# Patient Record
Sex: Male | Born: 1940 | Race: Black or African American | Hispanic: No | Smoking: Former smoker
Health system: Southern US, Community
[De-identification: ages and names within clinical notes are randomized; demographics above are authoritative.]

## PROBLEM LIST (undated history)

## (undated) DIAGNOSIS — N400 Enlarged prostate without lower urinary tract symptoms: Secondary | ICD-10-CM

## (undated) DIAGNOSIS — G4700 Insomnia, unspecified: Secondary | ICD-10-CM

## (undated) DIAGNOSIS — K5730 Diverticulosis of large intestine without perforation or abscess without bleeding: Secondary | ICD-10-CM

## (undated) DIAGNOSIS — M545 Low back pain, unspecified: Secondary | ICD-10-CM

## (undated) DIAGNOSIS — M1990 Unspecified osteoarthritis, unspecified site: Secondary | ICD-10-CM

## (undated) DIAGNOSIS — R609 Edema, unspecified: Secondary | ICD-10-CM

## (undated) DIAGNOSIS — G510 Bell's palsy: Secondary | ICD-10-CM

## (undated) HISTORY — DX: Bell's palsy: G51.0

## (undated) HISTORY — DX: Low back pain: M54.5

## (undated) HISTORY — DX: Benign prostatic hyperplasia without lower urinary tract symptoms: N40.0

## (undated) HISTORY — DX: Low back pain, unspecified: M54.50

## (undated) HISTORY — PX: OTHER SURGICAL HISTORY: SHX169

## (undated) HISTORY — DX: Unspecified osteoarthritis, unspecified site: M19.90

## (undated) HISTORY — DX: Insomnia, unspecified: G47.00

## (undated) HISTORY — DX: Diverticulosis of large intestine without perforation or abscess without bleeding: K57.30

## (undated) HISTORY — DX: Edema, unspecified: R60.9

---

## 1972-03-09 HISTORY — PX: FRACTURE SURGERY: SHX138

## 1999-09-29 ENCOUNTER — Encounter: Payer: Self-pay | Admitting: Cardiology

## 1999-09-29 ENCOUNTER — Encounter: Admission: RE | Admit: 1999-09-29 | Discharge: 1999-09-29 | Payer: Self-pay | Admitting: Cardiology

## 2002-06-06 ENCOUNTER — Encounter: Payer: Self-pay | Admitting: Family Medicine

## 2004-01-23 ENCOUNTER — Ambulatory Visit: Payer: Self-pay | Admitting: Family Medicine

## 2004-06-23 ENCOUNTER — Ambulatory Visit: Payer: Self-pay | Admitting: Family Medicine

## 2004-08-28 ENCOUNTER — Ambulatory Visit: Payer: Self-pay | Admitting: Family Medicine

## 2005-02-03 ENCOUNTER — Ambulatory Visit: Payer: Self-pay | Admitting: Family Medicine

## 2005-03-19 ENCOUNTER — Ambulatory Visit: Payer: Self-pay | Admitting: Family Medicine

## 2005-06-11 ENCOUNTER — Ambulatory Visit: Payer: Self-pay | Admitting: Family Medicine

## 2005-07-02 ENCOUNTER — Ambulatory Visit: Payer: Self-pay | Admitting: Family Medicine

## 2005-07-03 ENCOUNTER — Ambulatory Visit: Payer: Self-pay | Admitting: Family Medicine

## 2005-10-14 ENCOUNTER — Ambulatory Visit: Payer: Self-pay | Admitting: Family Medicine

## 2005-10-23 ENCOUNTER — Ambulatory Visit: Payer: Self-pay | Admitting: Family Medicine

## 2005-11-23 ENCOUNTER — Ambulatory Visit: Payer: Self-pay | Admitting: Family Medicine

## 2006-01-06 ENCOUNTER — Ambulatory Visit: Payer: Self-pay | Admitting: Family Medicine

## 2006-03-24 ENCOUNTER — Ambulatory Visit: Payer: Self-pay | Admitting: Family Medicine

## 2006-03-26 ENCOUNTER — Ambulatory Visit: Payer: Self-pay | Admitting: Family Medicine

## 2006-03-26 LAB — CONVERTED CEMR LAB
ALT: 34 units/L (ref 0–40)
AST: 41 units/L — ABNORMAL HIGH (ref 0–37)
Albumin: 3.5 g/dL (ref 3.5–5.2)
Alkaline Phosphatase: 62 units/L (ref 39–117)
BUN: 9 mg/dL (ref 6–23)
Basophils Absolute: 0 10*3/uL (ref 0.0–0.1)
Basophils Relative: 0.3 % (ref 0.0–1.0)
CO2: 28 meq/L (ref 19–32)
Calcium: 9.1 mg/dL (ref 8.4–10.5)
Chloride: 109 meq/L (ref 96–112)
Cholesterol: 165 mg/dL (ref 0–200)
Creatinine, Ser: 1.3 mg/dL (ref 0.4–1.5)
Eosinophils Relative: 2.3 % (ref 0.0–5.0)
GFR calc Af Amer: 71 mL/min
GFR calc non Af Amer: 59 mL/min
Glucose, Bld: 98 mg/dL (ref 70–99)
HCT: 43.5 % (ref 39.0–52.0)
HDL: 52.7 mg/dL (ref >39.0)
Hemoglobin: 14.5 g/dL (ref 13.0–17.0)
LDL Cholesterol: 94 mg/dL (ref 0–99)
Lymphocytes Relative: 35.8 % (ref 12.0–46.0)
MCHC: 33.3 g/dL (ref 30.0–36.0)
MCV: 92.1 fL (ref 78.0–100.0)
Monocytes Absolute: 0.4 10*3/uL (ref 0.2–0.7)
Monocytes Relative: 8.9 % (ref 3.0–11.0)
Neutro Abs: 2.5 10*3/uL (ref 1.4–7.7)
Neutrophils Relative %: 52.7 % (ref 43.0–77.0)
PSA: 4.64 ng/mL — ABNORMAL HIGH (ref 0.10–4.00)
Platelets: 400 10*3/uL (ref 150–400)
Potassium: 4.5 meq/L (ref 3.5–5.1)
RBC: 4.73 M/uL (ref 4.22–5.81)
RDW: 12.1 % (ref 11.5–14.6)
Sodium: 144 meq/L (ref 135–145)
TSH: 0.84 microintl units/mL (ref 0.35–5.50)
Total Bilirubin: 0.9 mg/dL (ref 0.3–1.2)
Total CHOL/HDL Ratio: 3.1
Total Protein: 7.4 g/dL (ref 6.0–8.3)
Triglycerides: 91 mg/dL (ref 0–149)
VLDL: 18 mg/dL (ref 0–40)
WBC: 4.6 10*3/uL (ref 4.5–10.5)

## 2006-04-29 ENCOUNTER — Ambulatory Visit: Payer: Self-pay | Admitting: Family Medicine

## 2006-05-26 ENCOUNTER — Ambulatory Visit: Payer: Self-pay | Admitting: Family Medicine

## 2006-06-02 ENCOUNTER — Ambulatory Visit: Payer: Self-pay | Admitting: Cardiovascular Disease

## 2006-08-06 ENCOUNTER — Ambulatory Visit: Payer: Self-pay | Admitting: Family Medicine

## 2006-08-10 ENCOUNTER — Ambulatory Visit: Payer: Self-pay

## 2006-08-17 ENCOUNTER — Ambulatory Visit: Payer: Self-pay | Admitting: Family Medicine

## 2006-08-30 DIAGNOSIS — M5137 Other intervertebral disc degeneration, lumbosacral region: Secondary | ICD-10-CM | POA: Insufficient documentation

## 2006-08-30 DIAGNOSIS — K5730 Diverticulosis of large intestine without perforation or abscess without bleeding: Secondary | ICD-10-CM | POA: Insufficient documentation

## 2006-10-05 ENCOUNTER — Encounter: Payer: Self-pay | Admitting: Family Medicine

## 2006-10-15 ENCOUNTER — Ambulatory Visit: Payer: Self-pay | Admitting: Family Medicine

## 2006-12-06 ENCOUNTER — Telehealth: Payer: Self-pay | Admitting: Family Medicine

## 2006-12-06 DIAGNOSIS — M545 Low back pain, unspecified: Secondary | ICD-10-CM | POA: Insufficient documentation

## 2006-12-17 ENCOUNTER — Telehealth: Payer: Self-pay | Admitting: Family Medicine

## 2006-12-28 ENCOUNTER — Ambulatory Visit: Payer: Self-pay | Admitting: Family Medicine

## 2006-12-31 LAB — CONVERTED CEMR LAB
ALT: 48 units/L (ref 0–53)
AST: 46 units/L — ABNORMAL HIGH (ref 0–37)
Basophils Relative: 0.8 % (ref 0.0–1.0)
Bilirubin, Direct: 0.2 mg/dL (ref 0.0–0.3)
CO2: 29 meq/L (ref 19–32)
Chloride: 109 meq/L (ref 96–112)
Creatinine, Ser: 1.2 mg/dL (ref 0.4–1.5)
Eosinophils Relative: 4.9 % (ref 0.0–5.0)
Glucose, Bld: 100 mg/dL — ABNORMAL HIGH (ref 70–99)
HCT: 41.7 % (ref 39.0–52.0)
LDL Cholesterol: 112 mg/dL — ABNORMAL HIGH (ref 0–99)
MCV: 90.8 fL (ref 78.0–100.0)
Neutrophils Relative %: 51.5 % (ref 43.0–77.0)
PSA: 3.13 ng/mL (ref 0.10–4.00)
RBC: 4.6 M/uL (ref 4.22–5.81)
RDW: 12.8 % (ref 11.5–14.6)
Sodium: 142 meq/L (ref 135–145)
Total Bilirubin: 0.7 mg/dL (ref 0.3–1.2)
Total CHOL/HDL Ratio: 3.3
VLDL: 10 mg/dL (ref 0–40)
WBC: 4.2 10*3/uL — ABNORMAL LOW (ref 4.5–10.5)

## 2007-01-04 ENCOUNTER — Ambulatory Visit: Payer: Self-pay | Admitting: Family Medicine

## 2007-01-04 DIAGNOSIS — N400 Enlarged prostate without lower urinary tract symptoms: Secondary | ICD-10-CM | POA: Insufficient documentation

## 2007-01-04 DIAGNOSIS — N138 Other obstructive and reflux uropathy: Secondary | ICD-10-CM | POA: Insufficient documentation

## 2007-01-04 DIAGNOSIS — M1990 Unspecified osteoarthritis, unspecified site: Secondary | ICD-10-CM | POA: Insufficient documentation

## 2007-01-28 ENCOUNTER — Telehealth: Payer: Self-pay | Admitting: Family Medicine

## 2007-01-28 ENCOUNTER — Encounter: Payer: Self-pay | Admitting: Family Medicine

## 2007-02-10 ENCOUNTER — Encounter: Admission: RE | Admit: 2007-02-10 | Discharge: 2007-02-10 | Payer: Self-pay | Admitting: Orthopaedic Surgery

## 2007-03-04 ENCOUNTER — Telehealth: Payer: Self-pay | Admitting: Family Medicine

## 2007-03-09 ENCOUNTER — Encounter: Payer: Self-pay | Admitting: Family Medicine

## 2007-03-18 ENCOUNTER — Encounter: Admission: RE | Admit: 2007-03-18 | Discharge: 2007-03-18 | Payer: Self-pay | Admitting: Orthopaedic Surgery

## 2007-04-08 ENCOUNTER — Ambulatory Visit: Payer: Self-pay | Admitting: Family Medicine

## 2007-04-08 DIAGNOSIS — J209 Acute bronchitis, unspecified: Secondary | ICD-10-CM

## 2007-05-05 ENCOUNTER — Telehealth: Payer: Self-pay | Admitting: Family Medicine

## 2007-05-06 ENCOUNTER — Telehealth: Payer: Self-pay | Admitting: Family Medicine

## 2007-06-08 ENCOUNTER — Telehealth: Payer: Self-pay | Admitting: Family Medicine

## 2007-06-22 ENCOUNTER — Telehealth: Payer: Self-pay | Admitting: Family Medicine

## 2007-06-24 ENCOUNTER — Encounter: Payer: Self-pay | Admitting: Family Medicine

## 2007-07-25 ENCOUNTER — Encounter: Payer: Self-pay | Admitting: Family Medicine

## 2007-11-30 ENCOUNTER — Telehealth: Payer: Self-pay | Admitting: Family Medicine

## 2007-11-30 ENCOUNTER — Ambulatory Visit: Payer: Self-pay | Admitting: Family Medicine

## 2008-01-04 ENCOUNTER — Ambulatory Visit: Payer: Self-pay | Admitting: Family Medicine

## 2008-01-04 LAB — CONVERTED CEMR LAB
Nitrite: NEGATIVE
Protein, U semiquant: NEGATIVE
Urobilinogen, UA: 0.2

## 2008-02-01 ENCOUNTER — Ambulatory Visit: Payer: Self-pay | Admitting: Family Medicine

## 2008-02-01 LAB — CONVERTED CEMR LAB
Bilirubin Urine: NEGATIVE
Blood in Urine, dipstick: NEGATIVE
Protein, U semiquant: NEGATIVE
Urobilinogen, UA: 2
pH: 5.5

## 2008-02-06 LAB — CONVERTED CEMR LAB
Albumin: 4.6 g/dL (ref 3.5–5.2)
BUN: 12 mg/dL (ref 6–23)
CO2: 24 meq/L (ref 19–32)
Calcium: 9.7 mg/dL (ref 8.4–10.5)
Creatinine, Ser: 1.1 mg/dL (ref 0.40–1.50)
Eosinophils Absolute: 0 10*3/uL (ref 0.0–0.7)
Eosinophils Relative: 1 % (ref 0–5)
Glucose, Bld: 96 mg/dL (ref 70–99)
HCT: 47.6 % (ref 39.0–52.0)
Hemoglobin: 15.7 g/dL (ref 13.0–17.0)
Lymphocytes Relative: 32 % (ref 12–46)
Lymphs Abs: 1.3 10*3/uL (ref 0.7–4.0)
MCV: 89.1 fL (ref 78.0–100.0)
Monocytes Absolute: 0.3 10*3/uL (ref 0.1–1.0)
Monocytes Relative: 8 % (ref 3–12)
PSA: 3.43 ng/mL (ref 0.10–4.00)
Platelets: 226 10*3/uL (ref 150–400)
Sodium: 142 meq/L (ref 135–145)
TSH: 0.904 microintl units/mL (ref 0.350–4.50)
Total Bilirubin: 1 mg/dL (ref 0.3–1.2)
Total Protein: 8.2 g/dL (ref 6.0–8.3)
WBC: 4.1 10*3/uL (ref 4.0–10.5)

## 2008-03-15 ENCOUNTER — Telehealth: Payer: Self-pay | Admitting: Family Medicine

## 2008-04-20 ENCOUNTER — Telehealth: Payer: Self-pay | Admitting: Family Medicine

## 2008-05-15 ENCOUNTER — Telehealth: Payer: Self-pay | Admitting: Family Medicine

## 2008-06-12 ENCOUNTER — Telehealth: Payer: Self-pay | Admitting: Family Medicine

## 2008-07-25 ENCOUNTER — Telehealth: Payer: Self-pay | Admitting: Family Medicine

## 2008-08-31 ENCOUNTER — Telehealth: Payer: Self-pay | Admitting: Family Medicine

## 2008-10-08 ENCOUNTER — Telehealth: Payer: Self-pay | Admitting: *Deleted

## 2008-11-09 ENCOUNTER — Telehealth: Payer: Self-pay | Admitting: Family Medicine

## 2008-11-22 ENCOUNTER — Telehealth: Payer: Self-pay | Admitting: Family Medicine

## 2008-12-26 ENCOUNTER — Telehealth: Payer: Self-pay | Admitting: Family Medicine

## 2009-02-04 ENCOUNTER — Telehealth: Payer: Self-pay | Admitting: Family Medicine

## 2009-02-06 ENCOUNTER — Ambulatory Visit: Payer: Self-pay | Admitting: Family Medicine

## 2009-03-18 ENCOUNTER — Telehealth: Payer: Self-pay | Admitting: Family Medicine

## 2009-03-29 ENCOUNTER — Telehealth: Payer: Self-pay | Admitting: Family Medicine

## 2009-04-02 ENCOUNTER — Telehealth: Payer: Self-pay | Admitting: Family Medicine

## 2009-04-24 ENCOUNTER — Telehealth: Payer: Self-pay | Admitting: Family Medicine

## 2009-05-02 ENCOUNTER — Ambulatory Visit: Payer: Self-pay | Admitting: Family Medicine

## 2009-05-03 LAB — CONVERTED CEMR LAB
ALT: 31 units/L (ref 0–53)
BUN: 11 mg/dL (ref 6–23)
Basophils Relative: 0.3 % (ref 0.0–3.0)
CO2: 31 meq/L (ref 19–32)
Chloride: 108 meq/L (ref 96–112)
Cholesterol: 198 mg/dL (ref 0–200)
Creatinine, Ser: 1 mg/dL (ref 0.4–1.5)
Eosinophils Absolute: 0.1 10*3/uL (ref 0.0–0.7)
Eosinophils Relative: 2 % (ref 0.0–5.0)
HCT: 47.3 % (ref 39.0–52.0)
LDL Cholesterol: 121 mg/dL — ABNORMAL HIGH (ref 0–99)
Lymphs Abs: 1.6 10*3/uL (ref 0.7–4.0)
MCHC: 32.6 g/dL (ref 30.0–36.0)
MCV: 94.1 fL (ref 78.0–100.0)
Monocytes Absolute: 0.4 10*3/uL (ref 0.1–1.0)
Neutrophils Relative %: 48.7 % (ref 43.0–77.0)
PSA: 2.22 ng/mL (ref 0.10–4.00)
Platelets: 199 10*3/uL (ref 150.0–400.0)
Potassium: 4.6 meq/L (ref 3.5–5.1)
TSH: 1.25 microintl units/mL (ref 0.35–5.50)
Total Bilirubin: 0.8 mg/dL (ref 0.3–1.2)
Total Protein: 7.8 g/dL (ref 6.0–8.3)
Triglycerides: 53 mg/dL (ref 0.0–149.0)
WBC: 4 10*3/uL — ABNORMAL LOW (ref 4.5–10.5)

## 2009-05-14 ENCOUNTER — Telehealth: Payer: Self-pay | Admitting: Family Medicine

## 2009-05-29 ENCOUNTER — Telehealth: Payer: Self-pay | Admitting: Family Medicine

## 2009-06-18 ENCOUNTER — Encounter (INDEPENDENT_AMBULATORY_CARE_PROVIDER_SITE_OTHER): Payer: Self-pay | Admitting: *Deleted

## 2009-07-09 ENCOUNTER — Telehealth: Payer: Self-pay | Admitting: Family Medicine

## 2009-08-20 ENCOUNTER — Telehealth: Payer: Self-pay | Admitting: Family Medicine

## 2009-08-28 ENCOUNTER — Telehealth: Payer: Self-pay | Admitting: Family Medicine

## 2009-09-26 ENCOUNTER — Telehealth: Payer: Self-pay | Admitting: Family Medicine

## 2009-11-12 ENCOUNTER — Telehealth: Payer: Self-pay | Admitting: Family Medicine

## 2009-12-17 ENCOUNTER — Telehealth: Payer: Self-pay | Admitting: Family Medicine

## 2010-01-21 ENCOUNTER — Telehealth: Payer: Self-pay | Admitting: Family Medicine

## 2010-02-25 ENCOUNTER — Telehealth: Payer: Self-pay | Admitting: Family Medicine

## 2010-02-27 ENCOUNTER — Telehealth: Payer: Self-pay | Admitting: Family Medicine

## 2010-04-08 NOTE — Progress Notes (Signed)
Summary: Pt req script for Percocet  Phone Note Call from Patient Call back at Home Phone [phone]   Caller: Patient Summary of Call: Pt req script for Percocet. Please call when ready for pick up.  Initial call taken by: Lucy Antigua,  April 24, 2009 1:17 PM  Follow-up for Phone Call        done Follow-up by: Nelwyn Salisbury MD,  April 24, 2009 2:23 PM  Additional Follow-up for Phone Call Additional follow up Details #1::        rx up front ready for p/u, pt aware Additional Follow-up by: Alfred Levins, CMA,  April 24, 2009 3:20 PM    Prescriptions: PERCOCET 7.5-500 MG  TABS (OXYCODONE-ACETAMINOPHEN) 1 every 6 hours as needed pain  #120 x 0   Entered and Authorized by:   Nelwyn Salisbury MD   Signed by:   Nelwyn Salisbury MD on 04/24/2009   Method used:   Print then Give to Patient   RxID:   [id number]

## 2010-04-08 NOTE — Progress Notes (Signed)
Summary: refill Temazepam  Phone Note Refill Request Message from:  Fax from Pharmacy on August 28, 2009 1:52 PM  Refills Requested: Medication #1:  TEMAZEPAM 30 MG CAPS 1 by mouth at bedtime   Dosage confirmed as above?Dosage Confirmed   Supply Requested: 1 month   Last Refilled: 07/29/2009  Method Requested: Fax to Local Pharmacy Initial call taken by: Raechel Ache, RN,  August 28, 2009 1:53 PM Caller: [facility] [address] [phone]*  Follow-up for Phone Call        call in #30 with 5 rf Follow-up by: Nelwyn Salisbury MD,  August 28, 2009 4:17 PM    Prescriptions: TEMAZEPAM 30 MG CAPS (TEMAZEPAM) 1 by mouth at bedtime  #30 x 5   Entered by:   Raechel Ache, RN   Authorized by:   Nelwyn Salisbury MD   Signed by:   Raechel Ache, RN on 08/28/2009   Method used:   Historical   RxID:   [id number]

## 2010-04-08 NOTE — Progress Notes (Signed)
Summary: refill temazepam  Phone Note From Pharmacy   Caller: [facility] [address] [phone]* Call For: fry  Summary of Call: refill temazepam 30mg  1 by mouth at bedtime Initial call taken by: Alfred Levins, CMA,  March 29, 2009 1:59 PM  Follow-up for Phone Call        No we gave him a 6 month supply in Sept.  Follow-up by: Nelwyn Salisbury MD,  March 29, 2009 3:28 PM  Additional Follow-up for Phone Call Additional follow up Details #1::        Phone call completed, Pharmacist called Additional Follow-up by: Alfred Levins, CMA,  March 29, 2009 3:36 PM

## 2010-04-08 NOTE — Letter (Signed)
Summary: Colonoscopy Date Change Letter  [facility]  [address] [place], [place] [address]   Phone: [phone]  Fax: [phone]      June 18, 2009 MRN: [id number]   [facility] [address] [place], [place]  [address]   Dear Mr. Nurse,   Previously you were recommended to have a repeat colonoscopy around this time. Your chart was recently reviewed by Dr. Judie Petit T. Russella Dar of [facility]. Follow up colonoscopy is now recommended in April 2014. This revised recommendation is based on current, nationally recognized guidelines for colorectal cancer screening and polyp surveillance. These guidelines are endorsed by the American Cancer Society, [employer] on Colorectal Cancer as well as numerous other major medical organizations.  Please understand that our recommendation assumes that you do not have any new symptoms such as bleeding, a change in bowel habits, anemia, or significant abdominal discomfort. If you do have any concerning GI symptoms or want to discuss the guideline recommendations, please call to arrange an office visit at your earliest convenience. Otherwise we will keep you in our reminder system and contact you 1-2 months prior to the date listed above to schedule your next colonoscopy.  Thank you,  Judie Petit T. Russella Dar, M.D.  [facility] [phone]

## 2010-04-08 NOTE — Progress Notes (Signed)
Summary: new rx  Phone Note Call from Patient Call back at Home Phone [phone]   Caller: Patient Call For: Brian Salisbury MD Summary of Call: pt needs new rx for percocet Initial call taken by: Heron Sabins,  Jul 09, 2009 8:46 AM  Follow-up for Phone Call        done Follow-up by: Brian Salisbury MD,  Jul 09, 2009 1:13 PM  Additional Follow-up for Phone Call Additional follow up Details #1::        Phone Call Completed Additional Follow-up by: Raechel Ache, RN,  Jul 09, 2009 1:22 PM    Prescriptions: PERCOCET 7.5-500 MG  TABS (OXYCODONE-ACETAMINOPHEN) 1 every 6 hours as needed pain  #120 x 0   Entered and Authorized by:   Brian Salisbury MD   Signed by:   Brian Salisbury MD on 07/09/2009   Method used:   Print then Give to Patient   RxID:   [id number]

## 2010-04-08 NOTE — Progress Notes (Signed)
Summary: refill  Phone Note Refill Request Call back at Home Phone [phone] Message from:  Patient---live call  Refills Requested: Medication #1:  PERCOCET 7.5-500 MG  TABS 1 every 6 hours as needed pain call pt when ready for p/u.  Initial call taken by: Warnell Forester,  January 21, 2010 9:10 AM  Follow-up for Phone Call        Pt called back to check on status of refill.  Follow-up by: Lucy Antigua,  January 22, 2010 8:31 AM  Additional Follow-up for Phone Call Additional follow up Details #1::        done Additional Follow-up by: Nelwyn Salisbury MD,  January 22, 2010 8:35 AM    Additional Follow-up for Phone Call Additional follow up Details #2::    pt notified Follow-up by: Pura Spice, RN,  January 22, 2010 8:43 AM  Prescriptions: PERCOCET 7.5-500 MG  TABS (OXYCODONE-ACETAMINOPHEN) 1 every 6 hours as needed pain  #120 x 0   Entered and Authorized by:   Nelwyn Salisbury MD   Signed by:   Nelwyn Salisbury MD on 01/22/2010   Method used:   Print then Give to Patient   RxID:   [id number]

## 2010-04-08 NOTE — Progress Notes (Signed)
Summary: Pt req script for Percocet  Phone Note Call from Patient Call back at Home Phone [phone]   Caller: Patient Summary of Call: Pt req script for Percocet. Pls call when ready for pick up.  Initial call taken by: Lucy Antigua,  August 20, 2009 2:06 PM  Follow-up for Phone Call        done Follow-up by: Nelwyn Salisbury MD,  August 21, 2009 5:27 PM  Additional Follow-up for Phone Call Additional follow up Details #1::        called Additional Follow-up by: Raechel Ache, RN,  August 22, 2009 8:18 AM    Prescriptions: PERCOCET 7.5-500 MG  TABS (OXYCODONE-ACETAMINOPHEN) 1 every 6 hours as needed pain  #120 x 0   Entered and Authorized by:   Nelwyn Salisbury MD   Signed by:   Nelwyn Salisbury MD on 08/21/2009   Method used:   Print then Give to Patient   RxID:   [id number]

## 2010-04-08 NOTE — Progress Notes (Signed)
Summary: lab results.  Phone Note Call from Patient   Caller: Patient Call For: Nelwyn Salisbury MD Summary of Call: [phone] Pt is asking for lab results. Initial call taken by: Lynann Beaver CMA,  May 14, 2009 1:16 PM  Follow-up for Phone Call        see report Follow-up by: Nelwyn Salisbury MD,  May 14, 2009 5:03 PM  Additional Follow-up for Phone Call Additional follow up Details #1::        Phone Call Completed Additional Follow-up by: Alfred Levins, CMA,  May 14, 2009 5:25 PM

## 2010-04-08 NOTE — Progress Notes (Signed)
Summary: refill  Phone Note Call from Brian Bond Call back at Home Phone [phone]   Caller: Brian Bond-refill Reason for Call: Refill Medication Summary of Call: refill percocet. call when ready. Initial call taken by: Warnell Forester,  March 18, 2009 9:05 AM  Follow-up for Phone Call        done Follow-up by: Nelwyn Salisbury MD,  March 18, 2009 3:50 PM  Additional Follow-up for Phone Call Additional follow up Details #1::        rx up front ready for p/u, pt aware Additional Follow-up by: Alfred Levins, CMA,  March 19, 2009 10:07 AM    Prescriptions: PERCOCET 7.5-500 MG  TABS (OXYCODONE-ACETAMINOPHEN) 1 every 6 hours as needed pain  #120 x 0   Entered and Authorized by:   Nelwyn Salisbury MD   Signed by:   Nelwyn Salisbury MD on 03/18/2009   Method used:   Print then Give to Brian Bond   RxID:   [id number]

## 2010-04-08 NOTE — Progress Notes (Signed)
Summary: Rx Refill  Phone Note Refill Request Message from:  Patient on December 17, 2009 9:36 AM  Refills Requested: Medication #1:  PERCOCET 7.5-500 MG  TABS 1 every 6 hours as needed pain  Method Requested: Pick up at Office Initial call taken by: Trixie Dredge,  December 17, 2009 9:36 AM  Follow-up for Phone Call        done  Follow-up by: Nelwyn Salisbury MD,  December 17, 2009 10:27 AM  Additional Follow-up for Phone Call Additional follow up Details #1::        PT AWARE. Additional Follow-up by: Pura Spice, RN,  December 17, 2009 10:31 AM    Prescriptions: PERCOCET 7.5-500 MG  TABS (OXYCODONE-ACETAMINOPHEN) 1 every 6 hours as needed pain  #120 x 0   Entered and Authorized by:   Nelwyn Salisbury MD   Signed by:   Nelwyn Salisbury MD on 12/17/2009   Method used:   Print then Give to Patient   RxID:   [id number]

## 2010-04-08 NOTE — Progress Notes (Signed)
Summary: Pt req script PERCOCET 7.5-500 MG  TABS  Phone Note Refill Request Call back at Home Phone [phone] Message from:  Patient on September 26, 2009 2:31 PM  Refills Requested: Medication #1:  PERCOCET 7.5-500 MG  TABS 1 every 6 hours as needed pain  Method Requested: Pick up at Office Initial call taken by: Lucy Antigua,  September 26, 2009 2:31 PM  Follow-up for Phone Call        done Follow-up by: Nelwyn Salisbury MD,  September 27, 2009 10:40 AM    Prescriptions: PERCOCET 7.5-500 MG  TABS (OXYCODONE-ACETAMINOPHEN) 1 every 6 hours as needed pain  #120 x 0   Entered and Authorized by:   Nelwyn Salisbury MD   Signed by:   Nelwyn Salisbury MD on 09/27/2009   Method used:   Print then Give to Patient   RxID:   [id number]

## 2010-04-08 NOTE — Progress Notes (Signed)
Summary: Pt req script for Percocet  Phone Note Refill Request Call back at Home Phone [phone] Message from:  Patient on November 12, 2009 8:41 AM  Refills Requested: Medication #1:  PERCOCET 7.5-500 MG  TABS 1 every 6 hours as needed pain   Dosage confirmed as above?Dosage Confirmed  Method Requested: Pick up at Office Initial call taken by: Lucy Antigua,  November 12, 2009 8:40 AM  Follow-up for Phone Call        done Follow-up by: Nelwyn Salisbury MD,  November 12, 2009 10:39 AM  Additional Follow-up for Phone Call Additional follow up Details #1::        pt notified, Additional Follow-up by: Pura Spice, RN,  November 12, 2009 11:18 AM    Prescriptions: PERCOCET 7.5-500 MG  TABS (OXYCODONE-ACETAMINOPHEN) 1 every 6 hours as needed pain  #120 x 0   Entered and Authorized by:   Nelwyn Salisbury MD   Signed by:   Nelwyn Salisbury MD on 11/12/2009   Method used:   Print then Give to Patient   RxID:   [id number]

## 2010-04-08 NOTE — Progress Notes (Signed)
Summary: refill temazepam  Phone Note From Pharmacy   Caller: [facility] [address] [phone]* Call For: fry  Summary of Call: pt does not have anymore refills of temazepam 30mg .  They only gave him 4 refills and the refill dates are  11/09/08 12/09/08 01/08/09 02/06/09 03/07/09 please advise  Initial call taken by: Alfred Levins, CMA,  April 02, 2009 11:26 AM  Follow-up for Phone Call        call in #30 with 5 rf Follow-up by: Nelwyn Salisbury MD,  April 03, 2009 8:41 AM  Additional Follow-up for Phone Call Additional follow up Details #1::        Phone call completed, Pharmacist called Additional Follow-up by: Alfred Levins, CMA,  April 03, 2009 10:23 AM    Prescriptions: TEMAZEPAM 30 MG CAPS (TEMAZEPAM) 1 by mouth at bedtime  #30 x 5   Entered by:   Alfred Levins, CMA   Authorized by:   Nelwyn Salisbury MD   Signed by:   Alfred Levins, CMA on 04/03/2009   Method used:   Telephoned to ...       [facility] [id number] (retail)       [address] [place], [place]  [address]       Ph: [phone]       Fax: [phone]   RxID:   [id number]

## 2010-04-08 NOTE — Progress Notes (Signed)
Summary: new rx  Phone Note Call from Patient Call back at Home Phone [phone]   Caller: Patient Call For: Nelwyn Salisbury MD Summary of Call: pt needs new rx percocet call when ready Initial call taken by: Heron Sabins,  May 29, 2009 4:56 PM  Follow-up for Phone Call        done Follow-up by: Nelwyn Salisbury MD,  May 30, 2009 8:38 AM    Prescriptions: PERCOCET 7.5-500 MG  TABS (OXYCODONE-ACETAMINOPHEN) 1 every 6 hours as needed pain  #120 x 0   Entered and Authorized by:   Nelwyn Salisbury MD   Signed by:   Nelwyn Salisbury MD on 05/30/2009   Method used:   Print then Give to Patient   RxID:   [id number]

## 2010-04-08 NOTE — Assessment & Plan Note (Signed)
Summary: ROA/FUP/RCD   Vital Signs:  Patient profile:   70 year old male Weight:      213 pounds Temp:     98.4 degrees F oral BP sitting:   102 / 80  (left arm) Cuff size:   large  Vitals Entered By: Alfred Levins, CMA (May 02, 2009 9:53 AM)  History of Present Illness: 70 yr old male for a cpx. He is doing well except for continued urination difficulties, including urgency and frequency. he stopped Avodart for awhile to see if it was helping, and sure enough his symptoms worsened after that. So he has started back on this and Flomax together.   Current Medications (verified): 1)  Temazepam 30 Mg Caps (Temazepam) .Marland Kitchen.. 1 By Mouth At Bedtime 2)  Percocet 7.5-500 Mg  Tabs (Oxycodone-Acetaminophen) .Marland Kitchen.. 1 Every 6 Hours As Needed Pain 3)  Flomax 0.4 Mg Caps (Tamsulosin Hcl) .... Once Daily 4)  Avodart 0.5 Mg Caps (Dutasteride) .Marland Kitchen.. 1 By Mouth Daily  Allergies (verified): No Known Drug Allergies  Past History:  Past Medical History: Reviewed history from 02/06/2009 and no changes required. Diverticulosis, colon Low back pain, saw  Dr. Noel Gerold. Had ESI series. LE edema Osteoarthritis insomnia Benign prostatic hypertrophy  Past Surgical History: Reviewed history from 01/04/2007 and no changes required. colonoscopy 3-04 per Dr. Russella Dar, recommended repeat in 2011  Family History: Reviewed history from 01/04/2007 and no changes required. Family History Hypertension  Social History: Reviewed history from 01/04/2007 and no changes required. Married Never Smoked Alcohol use-yes  Review of Systems  The patient denies anorexia, fever, weight loss, weight gain, vision loss, decreased hearing, hoarseness, chest pain, syncope, dyspnea on exertion, peripheral edema, prolonged cough, headaches, hemoptysis, abdominal pain, melena, hematochezia, severe indigestion/heartburn, hematuria, incontinence, genital sores, muscle weakness, suspicious skin lesions, transient blindness,  difficulty walking, depression, unusual weight change, abnormal bleeding, enlarged lymph nodes, angioedema, breast masses, and testicular masses.    Physical Exam  General:  Well-developed,well-nourished,in no acute distress; alert,appropriate and cooperative throughout examination Head:  Normocephalic and atraumatic without obvious abnormalities. No apparent alopecia or balding. Eyes:  No corneal or conjunctival inflammation noted. EOMI. Perrla. Funduscopic exam benign, without hemorrhages, exudates or papilledema. Vision grossly normal. Ears:  External ear exam shows no significant lesions or deformities.  Otoscopic examination reveals clear canals, tympanic membranes are intact bilaterally without bulging, retraction, inflammation or discharge. Hearing is grossly normal bilaterally. Nose:  External nasal examination shows no deformity or inflammation. Nasal mucosa are pink and moist without lesions or exudates. Mouth:  Oral mucosa and oropharynx without lesions or exudates.  Teeth in good repair. Neck:  No deformities, masses, or tenderness noted. Chest Wall:  No deformities, masses, tenderness or gynecomastia noted. Lungs:  Normal respiratory effort, chest expands symmetrically. Lungs are clear to auscultation, no crackles or wheezes. Heart:  Normal rate and regular rhythm. S1 and S2 normal without gallop, murmur, click, rub or other extra sounds. Abdomen:  Bowel sounds positive,abdomen soft and non-tender without masses, organomegaly or hernias noted. Rectal:  No external abnormalities noted. Normal sphincter tone. No rectal masses or tenderness. Heme neg Genitalia:  Testes bilaterally descended without nodularity, tenderness or masses. No scrotal masses or lesions. No penis lesions or urethral discharge. Prostate:  no nodules, no asymmetry, no induration, and 2+ enlarged.   Msk:  No deformity or scoliosis noted of thoracic or lumbar spine.   Pulses:  R and L carotid,radial,femoral,dorsalis  pedis and posterior tibial pulses are full and equal bilaterally Extremities:  No  clubbing, cyanosis, edema, or deformity noted with normal full range of motion of all joints.   Neurologic:  No cranial nerve deficits noted. Station and gait are normal. Plantar reflexes are down-going bilaterally. DTRs are symmetrical throughout. Sensory, motor and coordinative functions appear intact. Skin:  Intact without suspicious lesions or rashes Cervical Nodes:  No lymphadenopathy noted Axillary Nodes:  No palpable lymphadenopathy Inguinal Nodes:  No significant adenopathy Psych:  Cognition and judgment appear intact. Alert and cooperative with normal attention span and concentration. No apparent delusions, illusions, hallucinations   Impression & Recommendations:  Problem # 1:  EXAMINATION, ROUTINE MEDICAL (ICD-V70.0)  Orders: Hemoccult Guaiac-1 spec.(in office) (82270) UA Dipstick w/o Micro (automated)  (81003) Venipuncture (44010) TLB-Lipid Panel (80061-LIPID) TLB-BMP (Basic Metabolic Panel-BMET) (80048-METABOL) TLB-CBC Platelet - w/Differential (85025-CBCD) TLB-Hepatic/Liver Function Pnl (80076-HEPATIC) TLB-TSH (Thyroid Stimulating Hormone) (84443-TSH) TLB-PSA (Prostate Specific Antigen) (84153-PSA)  Complete Medication List: 1)  Temazepam 30 Mg Caps (Temazepam) .Marland Kitchen.. 1 by mouth at bedtime 2)  Percocet 7.5-500 Mg Tabs (Oxycodone-acetaminophen) .Marland Kitchen.. 1 every 6 hours as needed pain 3)  Flomax 0.4 Mg Caps (Tamsulosin hcl) .... Once daily 4)  Avodart 0.5 Mg Caps (Dutasteride) .Marland Kitchen.. 1 by mouth daily  Patient Instructions: 1)  Get labs today. Prescriptions: AVODART 0.5 MG CAPS (DUTASTERIDE) 1 by mouth daily  #30 x 11   Entered and Authorized by:   Nelwyn Salisbury MD   Signed by:   Nelwyn Salisbury MD on 05/02/2009   Method used:   Electronically to        [facility] [id number] (retail)       [address]       [place], [place]  [address]       Ph: [phone]        Fax: [phone]   RxID:   [id number] FLOMAX 0.4 MG CAPS (TAMSULOSIN HCL) once daily  #30 x 11   Entered and Authorized by:   Nelwyn Salisbury MD   Signed by:   Nelwyn Salisbury MD on 05/02/2009   Method used:   Electronically to        [facility] [id number] (retail)       [address] [place], [place]  [address]       Ph: [phone]       Fax: [phone]   RxID:   [id number]   Appended Document: ROA/FUP/RCD  Laboratory Results   Urine Tests    Routine Urinalysis   Color: yellow Appearance: Clear Glucose: negative   (Normal Range: Negative) Bilirubin: negative   (Normal Range: Negative) Ketone: negative   (Normal Range: Negative) Spec. Gravity: 1.025   (Normal Range: 1.003-1.035) Blood: negative   (Normal Range: Negative) pH: 5.5   (Normal Range: 5.0-8.0) Protein: negative   (Normal Range: Negative) Urobilinogen: 0.2   (Normal Range: 0-1) Nitrite: negative   (Normal Range: Negative) Leukocyte Esterace: negative   (Normal Range: Negative)    Comments: Rita Ohara  May 02, 2009 11:21 AM

## 2010-04-08 NOTE — Progress Notes (Signed)
Summary: Percocett refill request  Phone Note Call from Patient Call back at Home Phone [phone]   Caller: Patient Call For: Clent Ridges Summary of Call: Pt stopped by office and made a written request for a refill of Percocett 7.5-500mg , one tab every 6 hours as needed pain.  Last filled on 04/08/07, #60 with 0 RF. [facility] [address] Initial call taken by: Sid Falcon LPN,  May 06, 2007 2:16 PM  Follow-up for Phone Call        done Follow-up by: Nelwyn Salisbury MD,  May 06, 2007 4:53 PM  Additional Follow-up for Phone Call Additional follow up Details #1::        Phone Call Completed Additional Follow-up by: Alfred Levins, CMA,  May 06, 2007 4:54 PM      Prescriptions: PERCOCET 7.5-500 MG  TABS (OXYCODONE-ACETAMINOPHEN) 1 every 6 hours as needed pain  #60 x 0   Entered and Authorized by:   Nelwyn Salisbury MD   Signed by:   Nelwyn Salisbury MD on 05/06/2007   Method used:   Print then Give to Patient   RxID:   [id number]

## 2010-04-10 NOTE — Progress Notes (Signed)
Summary: refill pain med  Phone Note Refill Request Call back at Home Phone [phone] Message from:  Patient---live call  Refills Requested: Medication #1:  PERCOCET 7.5-500 MG  TABS 1 every 6 hours as needed pain call pt when done.  Initial call taken by: Warnell Forester,  February 27, 2010 8:45 AM  Follow-up for Phone Call        pt cb/pt is aware rx not ready for pick up Follow-up by: Heron Sabins,  March 04, 2010 9:16 AM  Additional Follow-up for Phone Call Additional follow up Details #1::        done Additional Follow-up by: Nelwyn Salisbury MD,  March 04, 2010 1:17 PM    Additional Follow-up for Phone Call Additional follow up Details #2::    pt aware rx ready for pick up    KIK Follow-up by: Duard Brady LPN,  March 04, 2010 2:01 PM  Prescriptions: PERCOCET 7.5-500 MG  TABS (OXYCODONE-ACETAMINOPHEN) 1 every 6 hours as needed pain  #120 x 0   Entered and Authorized by:   Nelwyn Salisbury MD   Signed by:   Nelwyn Salisbury MD on 03/04/2010   Method used:   Print then Give to Patient   RxID:   [id number]

## 2010-04-10 NOTE — Progress Notes (Signed)
Summary: rx temazepam   Phone Note From Pharmacy   Caller: [facility] [address]. [phone]* Summary of Call: refill temazepam  30 mg  Initial call taken by: Pura Spice, RN,  February 25, 2010 5:17 PM  Follow-up for Phone Call        call in #30 with 5 rf  Follow-up by: Nelwyn Salisbury MD,  February 26, 2010 10:08 AM  Additional Follow-up for Phone Call Additional follow up Details #1::        done  Additional Follow-up by: Pura Spice, RN,  February 26, 2010 12:03 PM    Prescriptions: TEMAZEPAM 30 MG CAPS (TEMAZEPAM) 1 by mouth at bedtime  #30 x 5   Entered by:   Pura Spice, RN   Authorized by:   Nelwyn Salisbury MD   Signed by:   Pura Spice, RN on 02/26/2010   Method used:   Telephoned to ...       [facility] [id number] (retail)       [address] [place], [place]  [address]       Ph: [phone]       Fax: [phone]   RxID:   [id number]

## 2010-04-17 ENCOUNTER — Encounter: Payer: Self-pay | Admitting: Family Medicine

## 2010-04-21 ENCOUNTER — Ambulatory Visit (INDEPENDENT_AMBULATORY_CARE_PROVIDER_SITE_OTHER): Payer: Federal, State, Local not specified - PPO | Admitting: Family Medicine

## 2010-04-21 ENCOUNTER — Encounter: Payer: Self-pay | Admitting: Family Medicine

## 2010-04-21 VITALS — BP 116/80 | HR 94 | Temp 98.9°F | Wt 217.0 lb

## 2010-04-21 DIAGNOSIS — M549 Dorsalgia, unspecified: Secondary | ICD-10-CM

## 2010-04-21 DIAGNOSIS — N401 Enlarged prostate with lower urinary tract symptoms: Secondary | ICD-10-CM

## 2010-04-21 DIAGNOSIS — N138 Other obstructive and reflux uropathy: Secondary | ICD-10-CM

## 2010-04-21 MED ORDER — OXYCODONE-ACETAMINOPHEN 7.5-500 MG PO TABS: 1.0000 | ORAL_TABLET | Freq: Four times a day (QID) | ORAL | Status: DC | PRN

## 2010-04-21 NOTE — Progress Notes (Signed)
  Subjective:    Patient ID: Brian Bond, male    DOB: 1940-10-31, 70 y.o.   MRN: [id number]  HPI Here to follow up on back pain, which is stable. He has stopped both prostate meds and is doing well well so far.    Review of Systems  Constitutional: Negative.   Respiratory: Negative.   Cardiovascular: Negative.   Genitourinary: Positive for urgency and decreased urine volume. Negative for dysuria and difficulty urinating.  Musculoskeletal: Positive for back pain.      Objective:   Physical Exam  Constitutional: He appears well-developed and well-nourished. No distress.  Musculoskeletal: Normal range of motion. He exhibits no edema and no tenderness.          Assessment & Plan:  Refilled pain meds. We will stay off prostate meds for now. Needs a cpx soon.

## 2010-05-14 ENCOUNTER — Other Ambulatory Visit: Payer: Federal, State, Local not specified - PPO | Admitting: Family Medicine

## 2010-05-14 DIAGNOSIS — Z0000 Encounter for general adult medical examination without abnormal findings: Secondary | ICD-10-CM

## 2010-05-14 LAB — HEPATIC FUNCTION PANEL
AST: 30 U/L (ref 0–37)
Albumin: 4.3 g/dL (ref 3.5–5.2)
Total Protein: 7.5 g/dL (ref 6.0–8.3)

## 2010-05-14 LAB — POCT URINALYSIS DIPSTICK
Bilirubin, UA: NEGATIVE
Blood, UA: NEGATIVE
Glucose, UA: NEGATIVE
Leukocytes, UA: NEGATIVE
Nitrite, UA: NEGATIVE
Urobilinogen, UA: 0.2

## 2010-05-14 LAB — LIPID PANEL
Cholesterol: 194 mg/dL (ref 0–200)
VLDL: 15 mg/dL (ref 0.0–40.0)

## 2010-05-14 LAB — CBC WITH DIFFERENTIAL/PLATELET
Basophils Absolute: 0 10*3/uL (ref 0.0–0.1)
Basophils Relative: 0.5 % (ref 0.0–3.0)
Eosinophils Absolute: 0.1 10*3/uL (ref 0.0–0.7)
HCT: 46.3 % (ref 39.0–52.0)
Hemoglobin: 15.8 g/dL (ref 13.0–17.0)
Lymphs Abs: 2 10*3/uL (ref 0.7–4.0)
MCHC: 34.1 g/dL (ref 30.0–36.0)
Monocytes Relative: 7.1 % (ref 3.0–12.0)
Neutro Abs: 1.9 10*3/uL (ref 1.4–7.7)
RDW: 13.2 % (ref 11.5–14.6)

## 2010-05-14 LAB — BASIC METABOLIC PANEL
CO2: 27 mEq/L (ref 19–32)
Chloride: 110 mEq/L (ref 96–112)
Glucose, Bld: 94 mg/dL (ref 70–99)
Potassium: 5.2 mEq/L — ABNORMAL HIGH (ref 3.5–5.1)
Sodium: 145 mEq/L (ref 135–145)

## 2010-05-14 LAB — PSA: PSA: 1.83 ng/mL (ref 0.10–4.00)

## 2010-05-22 ENCOUNTER — Ambulatory Visit (INDEPENDENT_AMBULATORY_CARE_PROVIDER_SITE_OTHER): Payer: Federal, State, Local not specified - PPO | Admitting: Family Medicine

## 2010-05-22 ENCOUNTER — Encounter: Payer: Self-pay | Admitting: Family Medicine

## 2010-05-22 VITALS — BP 116/74 | HR 68 | Ht 78.0 in | Wt 210.0 lb

## 2010-05-22 DIAGNOSIS — Z0000 Encounter for general adult medical examination without abnormal findings: Secondary | ICD-10-CM

## 2010-05-22 DIAGNOSIS — Z136 Encounter for screening for cardiovascular disorders: Secondary | ICD-10-CM

## 2010-05-22 DIAGNOSIS — M549 Dorsalgia, unspecified: Secondary | ICD-10-CM

## 2010-05-22 MED ORDER — OXYCODONE-ACETAMINOPHEN 7.5-500 MG PO TABS: 1.0000 | ORAL_TABLET | Freq: Four times a day (QID) | ORAL | Status: DC | PRN

## 2010-05-22 MED ORDER — SILDENAFIL CITRATE 100 MG PO TABS: 100.0000 mg | ORAL_TABLET | ORAL | Status: DC | PRN

## 2010-05-22 NOTE — Progress Notes (Signed)
  Subjective:    Patient ID: Brian Bond, male    DOB: 02/25/1941, 70 y.o.   MRN: [id number]  HPI 70 yr old male for a cpx. He feels well except for his chronic low back pain, which is stable. He still works full time. He stopped Avodart 2 months ago because it is expensive, and he has been urinating fairly well since.    Review of Systems  Constitutional: Negative.   HENT: Negative.   Eyes: Negative.   Respiratory: Negative.   Cardiovascular: Negative.   Gastrointestinal: Negative.   Genitourinary: Negative.   Musculoskeletal: Negative.   Skin: Negative.   Neurological: Negative.   Hematological: Negative.   Psychiatric/Behavioral: Negative.        Objective:   Physical Exam  Constitutional: He is oriented to person, place, and time. He appears well-developed and well-nourished. No distress.  HENT:  Head: Normocephalic and atraumatic.  Right Ear: External ear normal.  Left Ear: External ear normal.  Nose: Nose normal.  Mouth/Throat: Oropharynx is clear and moist. No oropharyngeal exudate.  Eyes: Conjunctivae and EOM are normal. Pupils are equal, round, and reactive to light. Right eye exhibits no discharge. Left eye exhibits no discharge. No scleral icterus.  Neck: Neck supple. No JVD present. No tracheal deviation present. No thyromegaly present.  Cardiovascular: Normal rate, regular rhythm, normal heart sounds and intact distal pulses.  Exam reveals no gallop and no friction rub.   No murmur heard.      EKG normal  Pulmonary/Chest: Effort normal and breath sounds normal. No respiratory distress. He has no wheezes. He has no rales. He exhibits no tenderness.  Abdominal: Soft. Bowel sounds are normal. He exhibits no distension and no mass. There is no tenderness. There is no rebound and no guarding.  Genitourinary: Rectum normal, prostate normal and penis normal. Guaiac negative stool. No penile tenderness.  Musculoskeletal: Normal range of motion. He exhibits no edema  and no tenderness.  Lymphadenopathy:    He has no cervical adenopathy.  Neurological: He is alert and oriented to person, place, and time. He has normal reflexes. No cranial nerve deficit. He exhibits normal muscle tone. Coordination normal.  Skin: Skin is warm and dry. No rash noted. He is not diaphoretic. No erythema. No pallor.  Psychiatric: He has a normal mood and affect. His behavior is normal. Judgment and thought content normal.          Assessment & Plan:  Refilled meds. Continue the diet.

## 2010-06-27 ENCOUNTER — Other Ambulatory Visit: Payer: Self-pay | Admitting: Family Medicine

## 2010-06-27 MED ORDER — TEMAZEPAM 30 MG PO CAPS: 30.0000 mg | ORAL_CAPSULE | Freq: Every evening | ORAL | Status: DC | PRN

## 2010-06-27 NOTE — Telephone Encounter (Signed)
Pt aware rx called in.  

## 2010-06-27 NOTE — Telephone Encounter (Signed)
Pt went to [facility] on [address] to get a refill of Temazepam. Pharmacist told pt that he would have to contact Dr Clent Ridges, in order to get more refills. Pt says that his bottle shows, two refills remaining. Pls call [facility] on [facility] [phone]. Pt is leaving to go out of town and wants to pick up refill before end of day.

## 2010-07-17 ENCOUNTER — Other Ambulatory Visit: Payer: Self-pay | Admitting: Family Medicine

## 2010-07-17 DIAGNOSIS — M549 Dorsalgia, unspecified: Secondary | ICD-10-CM

## 2010-07-17 NOTE — Telephone Encounter (Signed)
Pt needs new rx percocet 7.5mg 

## 2010-07-18 MED ORDER — OXYCODONE-ACETAMINOPHEN 7.5-500 MG PO TABS: 1.0000 | ORAL_TABLET | Freq: Four times a day (QID) | ORAL | Status: DC | PRN

## 2010-07-18 NOTE — Telephone Encounter (Signed)
done

## 2010-07-18 NOTE — Telephone Encounter (Signed)
Pt is aware.  

## 2010-08-26 ENCOUNTER — Telehealth: Payer: Self-pay | Admitting: Family Medicine

## 2010-08-26 DIAGNOSIS — M549 Dorsalgia, unspecified: Secondary | ICD-10-CM

## 2010-08-26 NOTE — Telephone Encounter (Signed)
Refill    Percocet

## 2010-08-27 MED ORDER — OXYCODONE-ACETAMINOPHEN 7.5-500 MG PO TABS: 1.0000 | ORAL_TABLET | Freq: Four times a day (QID) | ORAL | Status: DC | PRN

## 2010-08-27 NOTE — Telephone Encounter (Signed)
rx up front ready for p/u, pt aware 

## 2010-08-27 NOTE — Telephone Encounter (Signed)
done

## 2010-10-01 ENCOUNTER — Encounter: Payer: Self-pay | Admitting: Family Medicine

## 2010-10-01 ENCOUNTER — Ambulatory Visit (INDEPENDENT_AMBULATORY_CARE_PROVIDER_SITE_OTHER): Payer: Federal, State, Local not specified - PPO | Admitting: Family Medicine

## 2010-10-01 VITALS — BP 132/84 | HR 82 | Temp 99.3°F | Wt 213.0 lb

## 2010-10-01 DIAGNOSIS — M549 Dorsalgia, unspecified: Secondary | ICD-10-CM

## 2010-10-01 DIAGNOSIS — L259 Unspecified contact dermatitis, unspecified cause: Secondary | ICD-10-CM

## 2010-10-01 MED ORDER — HALOBETASOL PROPIONATE 0.05 % EX CREA: TOPICAL_CREAM | Freq: Two times a day (BID) | CUTANEOUS | Status: DC

## 2010-10-01 MED ORDER — OXYCODONE-ACETAMINOPHEN 7.5-500 MG PO TABS: 1.0000 | ORAL_TABLET | Freq: Four times a day (QID) | ORAL | Status: DC | PRN

## 2010-10-01 NOTE — Progress Notes (Signed)
  Subjective:    Patient ID: Brian Bond, male    DOB: 07-15-1940, 70 y.o.   MRN: [id number]  HPI Here for 2 weeks of an itchy rash on the left hand. Using OTC cortisone cream. It is not helping.   Review of Systems  Constitutional: Negative.   Skin: Positive for rash.       Objective:   Physical Exam  Constitutional: He appears well-developed and well-nourished.  Skin:       The dorsal left hand has a patch of scaly, excoriated, maculopapular lesions          Assessment & Plan:  Recheck prn

## 2010-10-13 ENCOUNTER — Other Ambulatory Visit: Payer: Self-pay | Admitting: Family Medicine

## 2010-11-13 ENCOUNTER — Other Ambulatory Visit: Payer: Self-pay | Admitting: Family Medicine

## 2010-11-13 DIAGNOSIS — M549 Dorsalgia, unspecified: Secondary | ICD-10-CM

## 2010-11-13 NOTE — Telephone Encounter (Signed)
Pt req refill of oxyCODONE-acetaminophen (PERCOCET) 7.5-500 MG per tablet

## 2010-11-13 NOTE — Telephone Encounter (Signed)
Refill request for Percocet 7.5-500 mg take 1 po q6hr prn. Pt last here on 10/01/10 and script last filled on 10/01/10.

## 2010-11-14 MED ORDER — OXYCODONE-ACETAMINOPHEN 7.5-500 MG PO TABS: 1.0000 | ORAL_TABLET | Freq: Four times a day (QID) | ORAL | Status: DC | PRN

## 2010-11-14 NOTE — Telephone Encounter (Signed)
done

## 2010-11-17 NOTE — Telephone Encounter (Signed)
Script ready for pick up and pt aware,

## 2011-01-01 ENCOUNTER — Telehealth: Payer: Self-pay | Admitting: Family Medicine

## 2011-01-01 DIAGNOSIS — M549 Dorsalgia, unspecified: Secondary | ICD-10-CM

## 2011-01-01 NOTE — Telephone Encounter (Signed)
Pt requesting refill on oxyCODONE-acetaminophen (PERCOCET) 7.5-500 MG per tablet  Please contact when ready to pick up

## 2011-01-05 MED ORDER — OXYCODONE-ACETAMINOPHEN 7.5-500 MG PO TABS: 1.0000 | ORAL_TABLET | Freq: Four times a day (QID) | ORAL | Status: DC | PRN

## 2011-01-05 NOTE — Telephone Encounter (Signed)
done

## 2011-01-05 NOTE — Telephone Encounter (Signed)
Pt is aware rx not ready for pick up

## 2011-01-05 NOTE — Telephone Encounter (Signed)
Script is ready for pick up and pt aware. 

## 2011-01-21 ENCOUNTER — Ambulatory Visit (INDEPENDENT_AMBULATORY_CARE_PROVIDER_SITE_OTHER): Payer: Federal, State, Local not specified - PPO | Admitting: Family Medicine

## 2011-01-21 ENCOUNTER — Telehealth: Payer: Self-pay | Admitting: Family Medicine

## 2011-01-21 ENCOUNTER — Encounter: Payer: Self-pay | Admitting: Family Medicine

## 2011-01-21 VITALS — BP 140/90 | HR 63 | Temp 99.0°F | Wt 206.0 lb

## 2011-01-21 DIAGNOSIS — L309 Dermatitis, unspecified: Secondary | ICD-10-CM

## 2011-01-21 DIAGNOSIS — M545 Low back pain, unspecified: Secondary | ICD-10-CM

## 2011-01-21 DIAGNOSIS — G4700 Insomnia, unspecified: Secondary | ICD-10-CM

## 2011-01-21 DIAGNOSIS — L259 Unspecified contact dermatitis, unspecified cause: Secondary | ICD-10-CM

## 2011-01-21 MED ORDER — TEMAZEPAM 30 MG PO CAPS: 30.0000 mg | ORAL_CAPSULE | Freq: Every evening | ORAL | Status: DC | PRN

## 2011-01-21 MED ORDER — CLOTRIMAZOLE-BETAMETHASONE 1-0.05 % EX CREA: TOPICAL_CREAM | CUTANEOUS | Status: AC

## 2011-01-21 NOTE — Progress Notes (Signed)
  Subjective:    Patient ID: Brian Bond, male    DOB: Aug 01, 1940, 70 y.o.   MRN: [id number]  HPI Here to recheck the rash on his neck and hands. He has had some improvement with Halobetasol but not much. These areas still itch and burn. He is sleeping well with Temazepam. His back pain is stable.    Review of Systems  Constitutional: Negative.   Musculoskeletal: Positive for back pain.  Skin: Positive for rash.       Objective:   Physical Exam  Constitutional: He appears well-developed and well-nourished.  Skin:       The dorsal hands have patches of scaly red cracked skin           Assessment & Plan:  Try Lotrisone cream.

## 2011-01-21 NOTE — Telephone Encounter (Signed)
Refill request for Temazepam 30 mg take 1 po qhs prn and pt last here on 10/01/10.

## 2011-01-21 NOTE — Telephone Encounter (Signed)
rx sent into pharmacy

## 2011-01-21 NOTE — Telephone Encounter (Signed)
Call in 6 month supply  

## 2011-02-24 ENCOUNTER — Telehealth: Payer: Self-pay | Admitting: Family Medicine

## 2011-02-24 MED ORDER — TAMSULOSIN HCL 0.4 MG PO CAPS: 0.4000 mg | ORAL_CAPSULE | Freq: Every day | ORAL | Status: DC

## 2011-02-24 NOTE — Telephone Encounter (Signed)
Script sent e-scribe 

## 2011-04-29 ENCOUNTER — Telehealth: Payer: Self-pay | Admitting: Family Medicine

## 2011-04-29 DIAGNOSIS — M549 Dorsalgia, unspecified: Secondary | ICD-10-CM

## 2011-04-29 NOTE — Telephone Encounter (Signed)
Pt would like refill on Percocet. Please call when ready for pick up. Thanks!

## 2011-04-30 ENCOUNTER — Other Ambulatory Visit (INDEPENDENT_AMBULATORY_CARE_PROVIDER_SITE_OTHER): Payer: Federal, State, Local not specified - PPO

## 2011-04-30 DIAGNOSIS — Z0000 Encounter for general adult medical examination without abnormal findings: Secondary | ICD-10-CM

## 2011-04-30 LAB — BASIC METABOLIC PANEL
BUN: 19 mg/dL (ref 6–23)
Chloride: 107 mEq/L (ref 96–112)
GFR: 81.79 mL/min (ref >60.00)
Potassium: 4.5 mEq/L (ref 3.5–5.1)
Sodium: 141 mEq/L (ref 135–145)

## 2011-04-30 LAB — CBC WITH DIFFERENTIAL/PLATELET
Basophils Relative: 0.4 % (ref 0.0–3.0)
Eosinophils Relative: 2.7 % (ref 0.0–5.0)
HCT: 49 % (ref 39.0–52.0)
Hemoglobin: 16.5 g/dL (ref 13.0–17.0)
Lymphs Abs: 1.5 10*3/uL (ref 0.7–4.0)
Monocytes Relative: 7.1 % (ref 3.0–12.0)
Platelets: 178 10*3/uL (ref 150.0–400.0)
RBC: 5.42 Mil/uL (ref 4.22–5.81)
WBC: 4.2 10*3/uL — ABNORMAL LOW (ref 4.5–10.5)

## 2011-04-30 LAB — PSA: PSA: 2.44 ng/mL (ref 0.10–4.00)

## 2011-04-30 LAB — LIPID PANEL
LDL Cholesterol: 114 mg/dL — ABNORMAL HIGH (ref 0–99)
VLDL: 6.6 mg/dL (ref 0.0–40.0)

## 2011-04-30 LAB — TSH: TSH: 1.16 u[IU]/mL (ref 0.35–5.50)

## 2011-04-30 LAB — HEPATIC FUNCTION PANEL
ALT: 31 U/L (ref 0–53)
AST: 34 U/L (ref 0–37)
Bilirubin, Direct: 0.2 mg/dL (ref 0.0–0.3)
Total Bilirubin: 0.7 mg/dL (ref 0.3–1.2)
Total Protein: 7.5 g/dL (ref 6.0–8.3)

## 2011-04-30 LAB — POCT URINALYSIS DIPSTICK
Leukocytes, UA: NEGATIVE
Nitrite, UA: NEGATIVE
Protein, UA: NEGATIVE
pH, UA: 5.5

## 2011-04-30 MED ORDER — OXYCODONE-ACETAMINOPHEN 7.5-500 MG PO TABS: 1.0000 | ORAL_TABLET | Freq: Four times a day (QID) | ORAL | Status: DC | PRN

## 2011-04-30 NOTE — Telephone Encounter (Signed)
done

## 2011-05-04 NOTE — Progress Notes (Signed)
Quick Note:  Left voice message ______ 

## 2011-05-26 ENCOUNTER — Encounter: Payer: Self-pay | Admitting: Family Medicine

## 2011-05-26 ENCOUNTER — Ambulatory Visit (INDEPENDENT_AMBULATORY_CARE_PROVIDER_SITE_OTHER): Payer: Federal, State, Local not specified - PPO | Admitting: Family Medicine

## 2011-05-26 VITALS — BP 126/80 | HR 85 | Temp 98.9°F | Ht 78.0 in | Wt 216.0 lb

## 2011-05-26 DIAGNOSIS — Z0000 Encounter for general adult medical examination without abnormal findings: Secondary | ICD-10-CM

## 2011-05-26 DIAGNOSIS — M549 Dorsalgia, unspecified: Secondary | ICD-10-CM

## 2011-05-26 DIAGNOSIS — N529 Male erectile dysfunction, unspecified: Secondary | ICD-10-CM

## 2011-05-26 MED ORDER — OXYCODONE-ACETAMINOPHEN 7.5-500 MG PO TABS: 1.0000 | ORAL_TABLET | Freq: Four times a day (QID) | ORAL | Status: DC | PRN

## 2011-05-26 NOTE — Progress Notes (Signed)
  Subjective:    Patient ID: Brian Bond, male    DOB: 02-21-1941, 71 y.o.   MRN: [id number]  HPI 71 yr old male for a cpx. He is doing well except for some mild fatigue and some reduced libido. He asks to have his testosterone checked. His chronic back pain is stable.    Review of Systems  Constitutional: Negative.   HENT: Negative.   Eyes: Negative.   Respiratory: Negative.   Cardiovascular: Negative.   Gastrointestinal: Negative.   Genitourinary: Negative.   Musculoskeletal: Negative.   Skin: Negative.   Neurological: Negative.   Hematological: Negative.   Psychiatric/Behavioral: Negative.        Objective:   Physical Exam  Constitutional: He is oriented to person, place, and time. He appears well-developed and well-nourished. No distress.  HENT:  Head: Normocephalic and atraumatic.  Right Ear: External ear normal.  Left Ear: External ear normal.  Nose: Nose normal.  Mouth/Throat: Oropharynx is clear and moist. No oropharyngeal exudate.  Eyes: Conjunctivae and EOM are normal. Pupils are equal, round, and reactive to light. Right eye exhibits no discharge. Left eye exhibits no discharge. No scleral icterus.  Neck: Neck supple. No JVD present. No tracheal deviation present. No thyromegaly present.  Cardiovascular: Normal rate, regular rhythm, normal heart sounds and intact distal pulses.  Exam reveals no gallop and no friction rub.   No murmur heard.      EKG normal   Pulmonary/Chest: Effort normal and breath sounds normal. No respiratory distress. He has no wheezes. He has no rales. He exhibits no tenderness.  Abdominal: Soft. Bowel sounds are normal. He exhibits no distension and no mass. There is no tenderness. There is no rebound and no guarding.  Genitourinary: Rectum normal, prostate normal and penis normal. Guaiac negative stool. No penile tenderness.  Musculoskeletal: Normal range of motion. He exhibits no edema and no tenderness.  Lymphadenopathy:    He has no  cervical adenopathy.  Neurological: He is alert and oriented to person, place, and time. He has normal reflexes. No cranial nerve deficit. He exhibits normal muscle tone. Coordination normal.  Skin: Skin is warm and dry. No rash noted. He is not diaphoretic. No erythema. No pallor.  Psychiatric: He has a normal mood and affect. His behavior is normal. Judgment and thought content normal.          Assessment & Plan:  Well exam. Check a testosterone level

## 2011-06-01 NOTE — Progress Notes (Signed)
Quick Note:  Left voice messasge ______

## 2011-07-28 ENCOUNTER — Telehealth: Payer: Self-pay | Admitting: Family Medicine

## 2011-07-28 MED ORDER — TEMAZEPAM 30 MG PO CAPS: 30.0000 mg | ORAL_CAPSULE | Freq: Every evening | ORAL | Status: DC | PRN

## 2011-07-28 NOTE — Telephone Encounter (Signed)
Call in #[phone] with 5 rf 

## 2011-07-28 NOTE — Telephone Encounter (Signed)
Refill request for Temazepam 30 mg take 1 po qhs prn and pt last here on 05/26/11.

## 2011-07-28 NOTE — Telephone Encounter (Signed)
I called in script 

## 2011-07-29 ENCOUNTER — Ambulatory Visit (INDEPENDENT_AMBULATORY_CARE_PROVIDER_SITE_OTHER): Payer: Federal, State, Local not specified - PPO | Admitting: Family Medicine

## 2011-07-29 ENCOUNTER — Encounter: Payer: Self-pay | Admitting: Family Medicine

## 2011-07-29 VITALS — BP 136/76 | HR 92 | Temp 99.6°F | Wt 214.0 lb

## 2011-07-29 DIAGNOSIS — R066 Hiccough: Secondary | ICD-10-CM

## 2011-07-29 MED ORDER — METOCLOPRAMIDE HCL 10 MG PO TABS: 10.0000 mg | ORAL_TABLET | Freq: Four times a day (QID) | ORAL | Status: DC

## 2011-07-29 NOTE — Progress Notes (Signed)
  Subjective:    Patient ID: Brian Bond, male    DOB: [birth year], 71 y.o.   MRN: [id number]  HPI Here for 2 weeks of intermittent hiccups. He has never had a problem with this before. No abdominal pain or nausea. No GERD symptoms.    Review of Systems  Respiratory: Negative.   Cardiovascular: Negative.   Gastrointestinal: Negative.        Objective:   Physical Exam  Constitutional: He appears well-developed and well-nourished.  Neck: Neck supple. No thyromegaly present.  Cardiovascular: Normal rate, regular rhythm, normal heart sounds and intact distal pulses.   Pulmonary/Chest: Effort normal and breath sounds normal.  Abdominal: Soft. Bowel sounds are normal. He exhibits no distension and no mass. There is no tenderness. There is no rebound and no guarding.  Lymphadenopathy:    He has no cervical adenopathy.          Assessment & Plan:  Try Reglan 4 times a day for a week or two

## 2011-10-12 ENCOUNTER — Telehealth: Payer: Self-pay | Admitting: Family Medicine

## 2011-10-12 DIAGNOSIS — M549 Dorsalgia, unspecified: Secondary | ICD-10-CM

## 2011-10-12 MED ORDER — OXYCODONE-ACETAMINOPHEN 7.5-500 MG PO TABS: 1.0000 | ORAL_TABLET | Freq: Four times a day (QID) | ORAL | Status: DC | PRN

## 2011-10-12 NOTE — Telephone Encounter (Signed)
done

## 2011-10-12 NOTE — Telephone Encounter (Signed)
Script is ready for pick up and I spoke with pt.  

## 2011-10-12 NOTE — Telephone Encounter (Signed)
Pt requesting refill on oxyCODONE-acetaminophen (PERCOCET) 7.5-500 MG per tablet

## 2011-11-16 ENCOUNTER — Telehealth: Payer: Self-pay | Admitting: Family Medicine

## 2011-11-16 DIAGNOSIS — M549 Dorsalgia, unspecified: Secondary | ICD-10-CM

## 2011-11-16 MED ORDER — OXYCODONE-ACETAMINOPHEN 7.5-500 MG PO TABS: 1.0000 | ORAL_TABLET | Freq: Four times a day (QID) | ORAL | Status: DC | PRN

## 2011-11-16 NOTE — Telephone Encounter (Signed)
Script is ready for pick up and I left voice message for pt. 

## 2011-11-16 NOTE — Telephone Encounter (Signed)
Patient called stating that he need a refill of his percocet. Please assist.  °

## 2011-11-16 NOTE — Telephone Encounter (Signed)
done

## 2011-12-18 ENCOUNTER — Telehealth: Payer: Self-pay | Admitting: Family Medicine

## 2011-12-18 DIAGNOSIS — M549 Dorsalgia, unspecified: Secondary | ICD-10-CM

## 2011-12-18 MED ORDER — OXYCODONE-ACETAMINOPHEN 7.5-500 MG PO TABS: 1.0000 | ORAL_TABLET | Freq: Four times a day (QID) | ORAL | Status: DC | PRN

## 2011-12-18 NOTE — Telephone Encounter (Signed)
Pt called req refill of oxyCODONE-acetaminophen (PERCOCET) 7.5-500 MG per tablet.  ° °

## 2011-12-18 NOTE — Telephone Encounter (Signed)
done

## 2011-12-21 NOTE — Telephone Encounter (Signed)
Script is ready for pick up and I spoke with pt.  

## 2012-01-25 ENCOUNTER — Telehealth: Payer: Self-pay | Admitting: Family Medicine

## 2012-01-25 DIAGNOSIS — M549 Dorsalgia, unspecified: Secondary | ICD-10-CM

## 2012-01-25 NOTE — Telephone Encounter (Signed)
Pt called req refill of oxyCODONE-acetaminophen (PERCOCET) 7.5-500 MG per tablet.

## 2012-01-26 MED ORDER — OXYCODONE-ACETAMINOPHEN 7.5-500 MG PO TABS: 1.0000 | ORAL_TABLET | Freq: Four times a day (QID) | ORAL | Status: DC | PRN

## 2012-01-26 NOTE — Telephone Encounter (Signed)
done

## 2012-01-27 NOTE — Telephone Encounter (Signed)
Scripts are ready for pick up and left a voice message for pt.

## 2012-01-29 ENCOUNTER — Telehealth: Payer: Self-pay | Admitting: Family Medicine

## 2012-01-29 NOTE — Telephone Encounter (Signed)
Call in 6 month supply  

## 2012-01-29 NOTE — Telephone Encounter (Signed)
Refill request for Temazepam 30 mg and last here on 07/29/11.

## 2012-02-01 MED ORDER — TEMAZEPAM 30 MG PO CAPS: 30.0000 mg | ORAL_CAPSULE | Freq: Every evening | ORAL | Status: DC | PRN

## 2012-02-01 NOTE — Telephone Encounter (Signed)
I called in script 

## 2012-02-25 ENCOUNTER — Telehealth: Payer: Self-pay | Admitting: Family Medicine

## 2012-02-25 DIAGNOSIS — M549 Dorsalgia, unspecified: Secondary | ICD-10-CM

## 2012-02-25 MED ORDER — OXYCODONE-ACETAMINOPHEN 7.5-500 MG PO TABS: 1.0000 | ORAL_TABLET | Freq: Four times a day (QID) | ORAL | Status: DC | PRN

## 2012-02-25 NOTE — Telephone Encounter (Signed)
done

## 2012-02-25 NOTE — Telephone Encounter (Signed)
Patient called stating that he need a refill of his percocet 7.5mg  1po bid. Please assist.

## 2012-02-26 NOTE — Telephone Encounter (Signed)
Script is ready for pick up and I left voice message. 

## 2012-03-09 DIAGNOSIS — G510 Bell's palsy: Secondary | ICD-10-CM

## 2012-03-09 HISTORY — DX: Bell's palsy: G51.0

## 2012-03-30 ENCOUNTER — Other Ambulatory Visit: Payer: Self-pay | Admitting: Family Medicine

## 2012-03-30 NOTE — Telephone Encounter (Signed)
Pt needs new rx percocet °

## 2012-04-01 ENCOUNTER — Telehealth: Payer: Self-pay | Admitting: Family Medicine

## 2012-04-01 MED ORDER — OXYCODONE-ACETAMINOPHEN 7.5-325 MG PO TABS: 1.0000 | ORAL_TABLET | Freq: Four times a day (QID) | ORAL | Status: DC | PRN

## 2012-04-01 NOTE — Telephone Encounter (Signed)
Refill for Tamsulosin 0.4 mg

## 2012-04-01 NOTE — Telephone Encounter (Signed)
done

## 2012-04-01 NOTE — Telephone Encounter (Signed)
Script is ready and I spoke with pt. 

## 2012-04-06 MED ORDER — TAMSULOSIN HCL 0.4 MG PO CAPS: 0.4000 mg | ORAL_CAPSULE | Freq: Every day | ORAL | Status: DC

## 2012-04-06 NOTE — Telephone Encounter (Signed)
I sent script e-scribe. 

## 2012-05-03 ENCOUNTER — Telehealth: Payer: Self-pay | Admitting: Family Medicine

## 2012-05-03 MED ORDER — OXYCODONE-ACETAMINOPHEN 7.5-325 MG PO TABS: 1.0000 | ORAL_TABLET | Freq: Four times a day (QID) | ORAL | Status: DC | PRN

## 2012-05-03 NOTE — Telephone Encounter (Signed)
Pt needs new rx percocet °

## 2012-05-03 NOTE — Telephone Encounter (Signed)
done

## 2012-05-03 NOTE — Telephone Encounter (Signed)
Script is ready for pick up and I spoke with pt.  

## 2012-05-19 ENCOUNTER — Other Ambulatory Visit (INDEPENDENT_AMBULATORY_CARE_PROVIDER_SITE_OTHER): Payer: Federal, State, Local not specified - PPO

## 2012-05-19 DIAGNOSIS — Z0000 Encounter for general adult medical examination without abnormal findings: Secondary | ICD-10-CM

## 2012-05-19 LAB — LIPID PANEL
Cholesterol: 160 mg/dL (ref 0–200)
LDL Cholesterol: 105 mg/dL — ABNORMAL HIGH (ref 0–99)
Triglycerides: 49 mg/dL (ref 0.0–149.0)

## 2012-05-19 LAB — CBC WITH DIFFERENTIAL/PLATELET
Basophils Absolute: 0 10*3/uL (ref 0.0–0.1)
Basophils Relative: 0.6 % (ref 0.0–3.0)
Eosinophils Absolute: 0.1 10*3/uL (ref 0.0–0.7)
Hemoglobin: 14.5 g/dL (ref 13.0–17.0)
Lymphs Abs: 1.5 10*3/uL (ref 0.7–4.0)
MCHC: 34 g/dL (ref 30.0–36.0)
MCV: 89.3 fl (ref 78.0–100.0)
Monocytes Absolute: 0.3 10*3/uL (ref 0.1–1.0)
Neutro Abs: 2.1 10*3/uL (ref 1.4–7.7)
RBC: 4.78 Mil/uL (ref 4.22–5.81)
RDW: 12.7 % (ref 11.5–14.6)

## 2012-05-19 LAB — BASIC METABOLIC PANEL
BUN: 18 mg/dL (ref 6–23)
Calcium: 9 mg/dL (ref 8.4–10.5)
Creatinine, Ser: 1.2 mg/dL (ref 0.4–1.5)
GFR: 77.6 mL/min (ref >60.00)

## 2012-05-19 LAB — POCT URINALYSIS DIPSTICK
Bilirubin, UA: NEGATIVE
Glucose, UA: NEGATIVE
Spec Grav, UA: >=1.03
Urobilinogen, UA: 1

## 2012-05-19 LAB — TSH: TSH: 0.78 u[IU]/mL (ref 0.35–5.50)

## 2012-05-19 LAB — HEPATIC FUNCTION PANEL
Albumin: 4 g/dL (ref 3.5–5.2)
Alkaline Phosphatase: 48 U/L (ref 39–117)
Total Bilirubin: 0.8 mg/dL (ref 0.3–1.2)

## 2012-05-19 LAB — PSA: PSA: 3.03 ng/mL (ref 0.10–4.00)

## 2012-05-24 NOTE — Progress Notes (Signed)
Quick Note:  Pt has appointment on 06/01/12 will go over then. ______

## 2012-06-01 ENCOUNTER — Ambulatory Visit (INDEPENDENT_AMBULATORY_CARE_PROVIDER_SITE_OTHER): Payer: Federal, State, Local not specified - PPO | Admitting: Family Medicine

## 2012-06-01 ENCOUNTER — Encounter: Payer: Self-pay | Admitting: Family Medicine

## 2012-06-01 VITALS — BP 122/76 | HR 97 | Temp 98.6°F | Ht 77.0 in | Wt 212.0 lb

## 2012-06-01 DIAGNOSIS — G8929 Other chronic pain: Secondary | ICD-10-CM

## 2012-06-01 DIAGNOSIS — M545 Low back pain, unspecified: Secondary | ICD-10-CM

## 2012-06-01 DIAGNOSIS — Z0000 Encounter for general adult medical examination without abnormal findings: Secondary | ICD-10-CM

## 2012-06-01 MED ORDER — OXYCODONE-ACETAMINOPHEN 7.5-325 MG PO TABS: 1.0000 | ORAL_TABLET | Freq: Four times a day (QID) | ORAL | Status: DC | PRN

## 2012-06-01 NOTE — Progress Notes (Signed)
  Subjective:    Patient ID: Brian Bond, male    DOB: [birth year], 72 y.o.   MRN: [id number]  HPI 72 yr old male for a cpx. He continues to work for the [employer], but this has become increasingly difficult for him due to his chronic low back pain. His last MRI was taken in 2001 and this has not been imaged since then. He has tried PT over the years with no effect. He saw Dr. Noel Gerold and had 2 epidural nerve blocks performed on 02-10-07 and again on 03-18-07 with no effect. He has been taking Percocet daily since then with only partial pain relief. Now over the past year the pain has gooten to the point that he does not think he can work any longer. He has pain, weakness and numbness down both legs.    Review of Systems  Constitutional: Negative.   HENT: Negative.   Eyes: Negative.   Respiratory: Negative.   Cardiovascular: Negative.   Gastrointestinal: Negative.   Genitourinary: Negative.   Musculoskeletal: Positive for back pain, arthralgias and gait problem. Negative for myalgias and joint swelling.  Skin: Negative.   Neurological: Negative.   Psychiatric/Behavioral: Negative.        Objective:   Physical Exam  Constitutional: He is oriented to person, place, and time. He appears well-developed and well-nourished. No distress.  HENT:  Head: Normocephalic and atraumatic.  Right Ear: External ear normal.  Left Ear: External ear normal.  Nose: Nose normal.  Mouth/Throat: Oropharynx is clear and moist. No oropharyngeal exudate.  Eyes: Conjunctivae and EOM are normal. Pupils are equal, round, and reactive to light. Right eye exhibits no discharge. Left eye exhibits no discharge. No scleral icterus.  Neck: Neck supple. No JVD present. No tracheal deviation present. No thyromegaly present.  Cardiovascular: Normal rate, regular rhythm, normal heart sounds and intact distal pulses.  Exam reveals no gallop and no friction rub.   No murmur heard. EKG normal   Pulmonary/Chest:  Effort normal and breath sounds normal. No respiratory distress. He has no wheezes. He has no rales. He exhibits no tenderness.  Abdominal: Soft. Bowel sounds are normal. He exhibits no distension and no mass. There is no tenderness. There is no rebound and no guarding.  Genitourinary: Rectum normal, prostate normal and penis normal. Guaiac negative stool. No penile tenderness.  Musculoskeletal: He exhibits no edema.  Mildly tender in the low back with spasms and decreased ROM   Lymphadenopathy:    He has no cervical adenopathy.  Neurological: He is alert and oriented to person, place, and time. He has normal reflexes. No cranial nerve deficit. He exhibits normal muscle tone. Coordination normal.  Skin: Skin is warm and dry. No rash noted. He is not diaphoretic. No erythema. No pallor.  Psychiatric: He has a normal mood and affect. His behavior is normal. Judgment and thought content normal.          Assessment & Plan:  Well exam. Set up another colonoscopy. Get another LS MRI soon. I encouraged him to apply for disability at the [facility] office.

## 2012-06-02 ENCOUNTER — Encounter: Payer: Federal, State, Local not specified - PPO | Admitting: Family Medicine

## 2012-06-15 ENCOUNTER — Other Ambulatory Visit: Payer: Federal, State, Local not specified - PPO

## 2012-06-16 ENCOUNTER — Encounter: Payer: Self-pay | Admitting: Gastroenterology

## 2012-06-20 ENCOUNTER — Ambulatory Visit
Admission: RE | Admit: 2012-06-20 | Discharge: 2012-06-20 | Disposition: A | Discharge status: Home / Self Care | Payer: Federal, State, Local not specified - PPO | Source: Ambulatory Visit | Attending: Family Medicine | Admitting: Family Medicine

## 2012-06-20 DIAGNOSIS — M545 Low back pain, unspecified: Secondary | ICD-10-CM

## 2012-06-27 NOTE — Progress Notes (Signed)
Quick Note:  I left voice message with results. ______ 

## 2012-06-27 NOTE — Addendum Note (Signed)
Addended by: Gershon Crane A on: 06/27/2012 08:41 AM   Modules accepted: Orders

## 2012-07-04 ENCOUNTER — Telehealth: Payer: Self-pay | Admitting: Family Medicine

## 2012-07-04 MED ORDER — OXYCODONE-ACETAMINOPHEN 7.5-325 MG PO TABS: 1.0000 | ORAL_TABLET | Freq: Four times a day (QID) | ORAL | Status: DC | PRN

## 2012-07-04 NOTE — Telephone Encounter (Signed)
Script is ready for pick up and I spoke with pt.  

## 2012-07-04 NOTE — Telephone Encounter (Signed)
done

## 2012-07-04 NOTE — Telephone Encounter (Signed)
PT called to request a refill of his oxyCODONE-acetaminophen (PERCOCET) 7.5-325 MG per tablet. Please assist

## 2012-08-02 ENCOUNTER — Telehealth: Payer: Self-pay | Admitting: Family Medicine

## 2012-08-02 MED ORDER — TEMAZEPAM 30 MG PO CAPS: 30.0000 mg | ORAL_CAPSULE | Freq: Every evening | ORAL | Status: DC | PRN

## 2012-08-02 NOTE — Telephone Encounter (Signed)
Refill request for Temazepam 30mg. 

## 2012-08-02 NOTE — Telephone Encounter (Signed)
Call in #[phone] with 5 rf 

## 2012-08-02 NOTE — Telephone Encounter (Signed)
The pharmacy is closed now, call in script in the morning.

## 2012-08-03 ENCOUNTER — Other Ambulatory Visit: Payer: Self-pay | Admitting: Family Medicine

## 2012-08-03 NOTE — Telephone Encounter (Signed)
I did get script called in to [facility], recently called [facility].

## 2012-08-03 NOTE — Telephone Encounter (Signed)
The pharmacy name has changed and pt will call back with correct phone number.

## 2012-08-05 ENCOUNTER — Telehealth: Payer: Self-pay | Admitting: Family Medicine

## 2012-08-05 MED ORDER — OXYCODONE-ACETAMINOPHEN 7.5-325 MG PO TABS: 1.0000 | ORAL_TABLET | Freq: Four times a day (QID) | ORAL | Status: DC | PRN

## 2012-08-05 NOTE — Telephone Encounter (Signed)
done

## 2012-08-05 NOTE — Telephone Encounter (Signed)
Pt needs refill of: oxyCODONE-acetaminophen (PERCOCET) 7.5-325 MG per tablet l

## 2012-08-05 NOTE — Telephone Encounter (Signed)
Scrip is ready for pick up and I left a voice message for pt.

## 2012-08-29 ENCOUNTER — Other Ambulatory Visit: Payer: Self-pay | Admitting: Neurosurgery

## 2012-08-29 DIAGNOSIS — M48061 Spinal stenosis, lumbar region without neurogenic claudication: Secondary | ICD-10-CM

## 2012-09-06 ENCOUNTER — Telehealth: Payer: Self-pay | Admitting: Family Medicine

## 2012-09-06 MED ORDER — OXYCODONE-ACETAMINOPHEN 7.5-325 MG PO TABS: 1.0000 | ORAL_TABLET | Freq: Four times a day (QID) | ORAL | Status: DC | PRN

## 2012-09-06 NOTE — Telephone Encounter (Signed)
done

## 2012-09-06 NOTE — Telephone Encounter (Signed)
Pt called to request a refill of his oxyCODONE-acetaminophen (PERCOCET) 7.5-325 MG per tablet. Please assist.

## 2012-09-06 NOTE — Telephone Encounter (Signed)
Script is ready for pick up and I spoke with pt.  

## 2012-09-07 ENCOUNTER — Ambulatory Visit
Admission: RE | Admit: 2012-09-07 | Discharge: 2012-09-07 | Disposition: A | Discharge status: Home / Self Care | Payer: Federal, State, Local not specified - PPO | Source: Ambulatory Visit | Attending: Neurosurgery | Admitting: Neurosurgery

## 2012-09-07 VITALS — BP 139/77 | HR 48

## 2012-09-07 DIAGNOSIS — M545 Low back pain, unspecified: Secondary | ICD-10-CM

## 2012-09-07 DIAGNOSIS — M48061 Spinal stenosis, lumbar region without neurogenic claudication: Secondary | ICD-10-CM

## 2012-09-07 MED ORDER — DIAZEPAM 5 MG PO TABS
5.0000 mg | ORAL_TABLET | Freq: Once | ORAL | Status: AC
  Administered 2012-09-07: 5 mg via ORAL

## 2012-09-07 MED ORDER — IOHEXOL 180 MG/ML  SOLN
15.0000 mL | Freq: Once | INTRAMUSCULAR | Status: AC | PRN
  Administered 2012-09-07: 15 mL via INTRATHECAL

## 2012-09-07 NOTE — Progress Notes (Signed)
Pt states he has been off tramadol for the past 2 days.

## 2012-09-07 NOTE — Progress Notes (Signed)
Son notified of his father's discharge time.  jkl rn

## 2012-10-14 ENCOUNTER — Telehealth: Payer: Self-pay | Admitting: Family Medicine

## 2012-10-14 MED ORDER — OXYCODONE-ACETAMINOPHEN 7.5-325 MG PO TABS: 1.0000 | ORAL_TABLET | Freq: Four times a day (QID) | ORAL | Status: DC | PRN

## 2012-10-14 NOTE — Telephone Encounter (Signed)
done

## 2012-10-14 NOTE — Telephone Encounter (Signed)
Pt needs new rx percocet °

## 2012-10-17 NOTE — Telephone Encounter (Signed)
Script is ready for pick up, tried to reach pt and no answer.  

## 2012-11-11 ENCOUNTER — Other Ambulatory Visit: Payer: Self-pay | Admitting: Family Medicine

## 2012-11-21 ENCOUNTER — Telehealth: Payer: Self-pay | Admitting: Family Medicine

## 2012-11-21 NOTE — Telephone Encounter (Signed)
Pt needs new rx oxycodone °

## 2012-11-22 MED ORDER — OXYCODONE-ACETAMINOPHEN 7.5-325 MG PO TABS: 1.0000 | ORAL_TABLET | Freq: Four times a day (QID) | ORAL | Status: DC | PRN

## 2012-11-22 NOTE — Telephone Encounter (Signed)
Script is ready for pick up, tried to reach pt & no answer. 

## 2012-11-22 NOTE — Telephone Encounter (Signed)
done

## 2012-12-03 ENCOUNTER — Other Ambulatory Visit: Payer: Self-pay | Admitting: Family Medicine

## 2012-12-27 ENCOUNTER — Telehealth: Payer: Self-pay | Admitting: Family Medicine

## 2012-12-27 NOTE — Telephone Encounter (Signed)
Pt needs new rx percocet °

## 2012-12-28 MED ORDER — OXYCODONE-ACETAMINOPHEN 7.5-325 MG PO TABS: 1.0000 | ORAL_TABLET | Freq: Four times a day (QID) | ORAL | Status: DC | PRN

## 2012-12-28 NOTE — Telephone Encounter (Signed)
Script is ready for pick up and left a voice message for pt.  

## 2012-12-28 NOTE — Telephone Encounter (Signed)
done

## 2013-02-03 ENCOUNTER — Telehealth: Payer: Self-pay | Admitting: Family Medicine

## 2013-02-03 NOTE — Telephone Encounter (Signed)
Pt needs new rx percocet °

## 2013-02-06 MED ORDER — OXYCODONE-ACETAMINOPHEN 7.5-325 MG PO TABS: 1.0000 | ORAL_TABLET | Freq: Four times a day (QID) | ORAL | Status: DC | PRN

## 2013-02-06 NOTE — Telephone Encounter (Signed)
Script is ready for pick up, contract printed and left a voice message for pt. 

## 2013-02-06 NOTE — Telephone Encounter (Signed)
Done, he needs testing

## 2013-02-07 ENCOUNTER — Other Ambulatory Visit: Payer: Self-pay | Admitting: Family Medicine

## 2013-02-08 NOTE — Telephone Encounter (Signed)
Refill for 6 months. 

## 2013-03-10 ENCOUNTER — Encounter: Payer: Self-pay | Admitting: Gastroenterology

## 2013-03-13 ENCOUNTER — Telehealth: Payer: Self-pay | Admitting: Family Medicine

## 2013-03-13 NOTE — Telephone Encounter (Signed)
Patient called requesting a refill on oxyCODONE-acetaminophen (PERCOCET) 7.5-325 MG per tablet Please advise

## 2013-03-14 ENCOUNTER — Encounter: Payer: Self-pay | Admitting: Family Medicine

## 2013-03-14 MED ORDER — OXYCODONE-ACETAMINOPHEN 7.5-325 MG PO TABS: 1.0000 | ORAL_TABLET | Freq: Four times a day (QID) | ORAL | Status: DC | PRN

## 2013-03-14 NOTE — Telephone Encounter (Signed)
Script is ready for pick up and I left a voice message.  

## 2013-03-14 NOTE — Telephone Encounter (Signed)
done

## 2013-04-17 ENCOUNTER — Telehealth: Payer: Self-pay | Admitting: Family Medicine

## 2013-04-17 MED ORDER — OXYCODONE-ACETAMINOPHEN 7.5-325 MG PO TABS: 1.0000 | ORAL_TABLET | Freq: Four times a day (QID) | ORAL | Status: DC | PRN

## 2013-04-17 NOTE — Telephone Encounter (Signed)
Script is ready for pick up and I left a voice message.  

## 2013-04-17 NOTE — Telephone Encounter (Signed)
Pt is requesting a refill of his oxyCODONE-acetaminophen (PERCOCET) 7.5-325 MG per tablet. Please call when ready for pick up.

## 2013-04-17 NOTE — Telephone Encounter (Signed)
done

## 2013-05-02 ENCOUNTER — Ambulatory Visit (AMBULATORY_SURGERY_CENTER): Payer: Self-pay

## 2013-05-02 VITALS — Wt 216.2 lb

## 2013-05-02 DIAGNOSIS — Z1211 Encounter for screening for malignant neoplasm of colon: Secondary | ICD-10-CM

## 2013-05-02 MED ORDER — MOVIPREP 100 G PO SOLR: ORAL | Status: DC

## 2013-05-05 ENCOUNTER — Encounter: Payer: Self-pay | Admitting: Gastroenterology

## 2013-05-09 ENCOUNTER — Encounter: Payer: Self-pay | Admitting: *Deleted

## 2013-05-09 ENCOUNTER — Encounter: Payer: Self-pay | Admitting: Gastroenterology

## 2013-05-09 ENCOUNTER — Ambulatory Visit (AMBULATORY_SURGERY_CENTER): Payer: Federal, State, Local not specified - PPO | Admitting: Gastroenterology

## 2013-05-09 VITALS — BP 141/97 | HR 55 | Temp 98.7°F | Resp 14 | Ht 78.0 in | Wt 216.0 lb

## 2013-05-09 DIAGNOSIS — Z1211 Encounter for screening for malignant neoplasm of colon: Secondary | ICD-10-CM

## 2013-05-09 HISTORY — PX: COLONOSCOPY: SHX174

## 2013-05-09 MED ORDER — SODIUM CHLORIDE 0.9 % IV SOLN: 500.0000 mL | INTRAVENOUS | Status: DC

## 2013-05-09 NOTE — Op Note (Signed)
[facility] [address] [place], [id number]   COLONOSCOPY PROCEDURE REPORT  PATIENT: Brian Bond, Brian E.  MR#: [id number] BIRTHDATE: 12-02-1940 , 72  yrs. old GENDER: Male ENDOSCOPIST: Meryl DareMalcolm T Lendon George, MD, [facility] PROCEDURE DATE:  05/09/2013 PROCEDURE:   Colonoscopy, screening First Screening Colonoscopy - Avg.  risk and is 50 yrs.  old or older - No.  Prior Negative Screening - Now for repeat screening. 10 or more years since last screening  History of Adenoma - Now for follow-up colonoscopy & has been > or = to 3 yrs.  N/A  Polyps Removed Today? No.  Recommend repeat exam, <10 yrs? No. ASA CLASS:   Class II INDICATIONS:average risk screening. MEDICATIONS: MAC sedation, administered by CRNA and propofol (Diprivan) 250mg  IV DESCRIPTION OF PROCEDURE:   After the risks benefits and alternatives of the procedure were thoroughly explained, informed consent was obtained.  A digital rectal exam revealed no abnormalities of the rectum.   The LB [id number]  endoscope was introduced through the anus and advanced to the cecum, which was identified by both the appendix and ileocecal valve. No adverse events experienced.   The quality of the prep was adequate, using MoviPrep  The instrument was then slowly withdrawn as the colon was fully examined.  COLON FINDINGS: Mild diverticulosis was noted in the sigmoid colon. The colon was otherwise normal.  There was no diverticulosis, inflammation, polyps or cancers unless previously stated. Retroflexed views revealed small internal hemorrhoids. The time to cecum=3 minutes 04 seconds.  Withdrawal time=9 minutes 30 seconds. The scope was withdrawn and the procedure completed.  COMPLICATIONS: There were no complications.  ENDOSCOPIC IMPRESSION: 1.   Mild diverticulosis in the sigmoid colon 2.   Small internal hemorrhoids  RECOMMENDATIONS: 1.  High fiber diet with liberal fluid intake. 2.  Given your age, you will not need  another colonoscopy for colon cancer screening or polyp surveillance.  These types of tests usually stop around the age 72.  eSigned:  Meryl DareMalcolm T Jewelle Whitner, MD, [facility] 05/09/2013 10:02 AM

## 2013-05-09 NOTE — Progress Notes (Signed)
Report to pacu rn, vss, bbs=clear 

## 2013-05-09 NOTE — Patient Instructions (Signed)
Discharge instructions given with verbal understanding. Handouts on diverticulosis and hemorrhoids. Resume previous medications. YOU HAD AN ENDOSCOPIC PROCEDURE TODAY AT THE [facility]: Refer to the procedure report that was given to you for any specific questions about what was found during the examination.  If the procedure report does not answer your questions, please call your gastroenterologist to clarify.  If you requested that your care partner not be given the details of your procedure findings, then the procedure report has been included in a sealed envelope for you to review at your convenience later.  YOU SHOULD EXPECT: Some feelings of bloating in the abdomen. Passage of more gas than usual.  Walking can help get rid of the air that was put into your GI tract during the procedure and reduce the bloating. If you had a lower endoscopy (such as a colonoscopy or flexible sigmoidoscopy) you may notice spotting of blood in your stool or on the toilet paper. If you underwent a bowel prep for your procedure, then you may not have a normal bowel movement for a few days.  DIET: Your first meal following the procedure should be a light meal and then it is ok to progress to your normal diet.  A half-sandwich or bowl of soup is an example of a good first meal.  Heavy or fried foods are harder to digest and may make you feel nauseous or bloated.  Likewise meals heavy in dairy and vegetables can cause extra gas to form and this can also increase the bloating.  Drink plenty of fluids but you should avoid alcoholic beverages for 24 hours.  ACTIVITY: Your care partner should take you home directly after the procedure.  You should plan to take it easy, moving slowly for the rest of the day.  You can resume normal activity the day after the procedure however you should NOT DRIVE or use heavy machinery for 24 hours (because of the sedation medicines used during the test).    SYMPTOMS TO REPORT  IMMEDIATELY: A gastroenterologist can be reached at any hour.  During normal business hours, 8:30 AM to 5:00 PM Monday through Friday, call [phone].  After hours and on weekends, please call the GI answering service at [phone] who will take a message and have the physician on call contact you.   Following lower endoscopy (colonoscopy or flexible sigmoidoscopy):  Excessive amounts of blood in the stool  Significant tenderness or worsening of abdominal pains  Swelling of the abdomen that is new, acute  Fever of 100F or higher  FOLLOW UP: If any biopsies were taken you will be contacted by phone or by letter within the next 1-3 weeks.  Call your gastroenterologist if you have not heard about the biopsies in 3 weeks.  Our staff will call the home number listed on your records the next business day following your procedure to check on you and address any questions or concerns that you may have at that time regarding the information given to you following your procedure. This is a courtesy call and so if there is no answer at the home number and we have not heard from you through the emergency physician on call, we will assume that you have returned to your regular daily activities without incident.  SIGNATURES/CONFIDENTIALITY: You and/or your care partner have signed paperwork which will be entered into your electronic medical record.  These signatures attest to the fact that that the information above on your After Visit Summary   has been reviewed and is understood.  Full responsibility of the confidentiality of this discharge information lies with you and/or your care-partner. 

## 2013-05-10 ENCOUNTER — Telehealth: Payer: Self-pay | Admitting: *Deleted

## 2013-05-10 NOTE — Telephone Encounter (Signed)
  Follow up Call-  Call back number 05/09/2013 09/07/2012  Post procedure Call Back phone  # [phone]  Permission to leave phone message Yes -     Patient questions:  Do you have a fever, pain , or abdominal swelling? no Pain Score  0 *  Have you tolerated food without any problems? yes  Have you been able to return to your normal activities? yes  Do you have any questions about your discharge instructions: Diet   no Medications  no Follow up visit  no  Do you have questions or concerns about your Care? yes  Actions: * If pain score is 4 or above: No action needed, pain <4.

## 2013-05-11 ENCOUNTER — Other Ambulatory Visit: Payer: Self-pay | Admitting: Family Medicine

## 2013-05-22 ENCOUNTER — Telehealth: Payer: Self-pay | Admitting: Family Medicine

## 2013-05-22 ENCOUNTER — Encounter (HOSPITAL_COMMUNITY): Payer: Self-pay | Admitting: Emergency Medicine

## 2013-05-22 ENCOUNTER — Emergency Department (HOSPITAL_COMMUNITY)
Admission: EM | Admit: 2013-05-22 | Discharge: 2013-05-22 | Disposition: A | Discharge status: Home / Self Care | Payer: Federal, State, Local not specified - PPO | Attending: Emergency Medicine | Admitting: Emergency Medicine

## 2013-05-22 DIAGNOSIS — Z8719 Personal history of other diseases of the digestive system: Secondary | ICD-10-CM | POA: Insufficient documentation

## 2013-05-22 DIAGNOSIS — Z79899 Other long term (current) drug therapy: Secondary | ICD-10-CM | POA: Insufficient documentation

## 2013-05-22 DIAGNOSIS — G4700 Insomnia, unspecified: Secondary | ICD-10-CM | POA: Insufficient documentation

## 2013-05-22 DIAGNOSIS — Z87891 Personal history of nicotine dependence: Secondary | ICD-10-CM | POA: Insufficient documentation

## 2013-05-22 DIAGNOSIS — M1990 Unspecified osteoarthritis, unspecified site: Secondary | ICD-10-CM | POA: Insufficient documentation

## 2013-05-22 DIAGNOSIS — G510 Bell's palsy: Secondary | ICD-10-CM

## 2013-05-22 DIAGNOSIS — N400 Enlarged prostate without lower urinary tract symptoms: Secondary | ICD-10-CM | POA: Insufficient documentation

## 2013-05-22 MED ORDER — PREDNISONE 20 MG PO TABS
60.0000 mg | ORAL_TABLET | Freq: Once | ORAL | Status: AC
  Administered 2013-05-22: 60 mg via ORAL
  Filled 2013-05-22: qty 3

## 2013-05-22 MED ORDER — VALACYCLOVIR HCL 1 G PO TABS: 1000.0000 mg | ORAL_TABLET | Freq: Three times a day (TID) | ORAL | Status: AC

## 2013-05-22 MED ORDER — ARTIFICIAL TEARS OP OINT: TOPICAL_OINTMENT | Freq: Every evening | OPHTHALMIC | Status: DC | PRN

## 2013-05-22 MED ORDER — PREDNISONE (PAK) 10 MG PO TABS: ORAL_TABLET | Freq: Every day | ORAL | Status: DC

## 2013-05-22 MED ORDER — OXYCODONE-ACETAMINOPHEN 7.5-325 MG PO TABS: 1.0000 | ORAL_TABLET | Freq: Four times a day (QID) | ORAL | Status: DC | PRN

## 2013-05-22 NOTE — Telephone Encounter (Signed)
Per Dr. Clent RidgesFry, pt can schedule to come in on 05/26/13, okay to use 2 same days. Also let pt know that we have a script ready for pick up.

## 2013-05-22 NOTE — Telephone Encounter (Signed)
Pt is needing new rx for oxyCODONE-acetaminophen (PERCOCET) 7.5-325 MG per tablet, please call when available for pick up. ° °

## 2013-05-22 NOTE — ED Notes (Signed)
Pt states that he has been noticing more twitching, eye watering, and facial "changes" to left side of face x2 weeks. Denies any weakness, numbness to any of his extremities, states that he works everyday as usual. Pt does not understand why co-workers called EMS.

## 2013-05-22 NOTE — Telephone Encounter (Signed)
Script is ready for pick up and pt is aware. 

## 2013-05-22 NOTE — Discharge Instructions (Signed)
Return to the ED with any concerns including headache, fever, weakness in arms or legs, changes in vision or speech, decreased level of alertness/lethargy, or any other alarming symptoms

## 2013-05-22 NOTE — Telephone Encounter (Signed)
done

## 2013-05-22 NOTE — Telephone Encounter (Signed)
Pt went to ed today. Dx w/ belles palsy. Advised pt to fu w/ pcp asap. Only same day appts  pls advise

## 2013-05-22 NOTE — ED Notes (Signed)
Pt arrives from work via ems for evaluation of facial droop, eye watering, and eye twitching. Pt states facial changes have been going on for a week, EMS denies any neuro deficits. Pt also c/o facial paralysis. No other complaints. EMS states no movement of left sided face when racing eyebrows. Pt alert and oriented, skin warm and dry,  nad noted. Dr. Karma GanjaLinker at bedside to assess pt.

## 2013-05-22 NOTE — ED Provider Notes (Signed)
CSN: [id number]     Arrival date & time 05/22/13  1206 History   First MD Initiated Contact with Patient 05/22/13 1207     Chief Complaint  Patient presents with  . Facial Droop     (Consider location/radiation/quality/duration/timing/severity/associated sxs/prior Treatment) HPI Pt presenting with c/o right sided facial droop- he states he has had these symptoms for the past several days.  He presumes this is due to irriation from his partial denture.  Has had some tearing from right eye.  Coworkers noted that he had facial droop and insisted he go to the hospital.  No weakness of arms or legs.  No changes in vision or speech.  Pt states he did not want to come by EMS but felt he had no choice. No headache, no chest pain.  He has not had symptoms like this previously.  There are no other associated systemic symptoms, there are no other alleviating or modifying factors.   Past Medical History  Diagnosis Date  . Diverticula, colon   . Low back pain   . Edema   . Osteoarthritis   . Insomnia   . BPH (benign prostatic hyperplasia)    Past Surgical History  Procedure Laterality Date  . Colonoscopy  March 2004    per Dr. Russella Dar, clear, repeat in 10 yrs  . Broken leg      right leg   Family History  Problem Relation Age of Onset  . Hypertension      family hx  . Breast cancer Sister    History  Substance Use Topics  . Smoking status: Former Smoker    Quit date: 05/03/1991  . Smokeless tobacco: Never Used  . Alcohol Use: 1.2 oz/week    2 Cans of beer per week    Review of Systems ROS reviewed and all otherwise negative except for mentioned in HPI    Allergies  Review of patient's allergies indicates no known allergies.  Home Medications   Current Outpatient Rx  Name  Route  Sig  Dispense  Refill  . tamsulosin (FLOMAX) 0.4 MG CAPS capsule   Oral   Take 0.4 mg by mouth daily after breakfast.         . temazepam (RESTORIL) 30 MG capsule   Oral   Take 30 mg by mouth  at bedtime as needed for sleep.         Marland Kitchen artificial tears (LACRILUBE) OINT ophthalmic ointment   Right Eye   Place into the right eye at bedtime as needed for dry eyes. At bedtime qHS x 1 week   3.5 g   0   . oxyCODONE-acetaminophen (PERCOCET) 7.5-325 MG per tablet   Oral   Take 1 tablet by mouth every 6 (six) hours as needed for pain.   120 tablet   0   . predniSONE (STERAPRED UNI-PAK) 10 MG tablet   Oral   Take by mouth daily. Take 60mg  po qD x 1 week   42 tablet   0   . valACYclovir (VALTREX) 1000 MG tablet   Oral   Take 1 tablet (1,000 mg total) by mouth 3 (three) times daily.   21 tablet   0    BP 123/71  Pulse 47  Temp(Src) 97.9 F (36.6 C) (Oral)  Resp 16  Wt 215 lb (97.523 kg)  SpO2 100% Vitals reviewed Physical Exam Physical Examination: General appearance - alert, well appearing, and in no distress Mental status - alert, oriented to person, place, and  time Eyes - no scleral icterus, no conjunctival injection Mouth - mucous membranes moist, pharynx normal without lesions Neck - supple, no significant adenopathy Chest - clear to auscultation, no wheezes, rales or rhonchi, symmetric air entry Heart - normal rate, regular rhythm, normal S1, S2, no murmurs, rubs, clicks or gallops Neurological - alert, oriented x 3, right sided facial droop, flat forehead with raising eyelids, not able to fully close right eyelid Extremities - peripheral pulses normal, no pedal edema, no clubbing or cyanosis Skin - normal coloration and turgor, no rashes  ED Course  Procedures (including critical care time) Labs Review Labs Reviewed - No data to display Imaging Review No results found.   EKG Interpretation None      MDM   Final diagnoses:  Bell's palsy    Pt presenting with c/o right sided facial droop.  Exam is c/w Bell's Palsy.  Will treat with prednisone and valacyclovir.  Discharged with strict return precautions.  Pt agreeable with plan.    Ethelda ChickMartha K  Linker, MD 05/23/13 [phone]

## 2013-05-23 ENCOUNTER — Other Ambulatory Visit: Payer: Self-pay | Admitting: Family Medicine

## 2013-05-23 NOTE — Telephone Encounter (Signed)
appt scheduled, pt aware rx ready for pu.

## 2013-05-26 ENCOUNTER — Ambulatory Visit (INDEPENDENT_AMBULATORY_CARE_PROVIDER_SITE_OTHER): Payer: Federal, State, Local not specified - PPO | Admitting: Family Medicine

## 2013-05-26 ENCOUNTER — Encounter: Payer: Self-pay | Admitting: Family Medicine

## 2013-05-26 VITALS — BP 130/80 | Temp 99.2°F | Ht 78.0 in | Wt 212.0 lb

## 2013-05-26 DIAGNOSIS — G510 Bell's palsy: Secondary | ICD-10-CM

## 2013-05-26 MED ORDER — TAMSULOSIN HCL 0.4 MG PO CAPS: 0.8000 mg | ORAL_CAPSULE | Freq: Every day | ORAL | Status: DC

## 2013-05-26 NOTE — Progress Notes (Signed)
Pre visit review using our clinic review tool, if applicable. No additional management support is needed unless otherwise documented below in the visit note. 

## 2013-05-26 NOTE — Progress Notes (Signed)
   Subjective:    Patient ID: Brian Bond, male    DOB: 1941/02/17, 73 y.o.   MRN: [id number]  HPI Here to follow up an ED visit on 05-22-13 for the sudden onset of weakness of the right facial muscles. He had no other neurologic deficits, no HA, so this was diagnosed as Bells palsy. He was placed a 7 day regimen of Valtrex and prednisone. Since then he has felt fine except for some numbness of the right forehead and cheek. Using drops to keep the right eye moist. He returned to work yesterday.    Review of Systems  HENT: Negative.   Eyes: Negative.   Neurological: Positive for weakness.       Objective:   Physical Exam  Constitutional: He is oriented to person, place, and time. He appears well-developed and well-nourished. No distress.  HENT:  Right Ear: External ear normal.  Left Ear: External ear normal.  Mouth/Throat: Oropharynx is clear and moist.  Eyes: Conjunctivae and EOM are normal. Pupils are equal, round, and reactive to light.  Neck: Neck supple. No thyromegaly present.  Neurological: He is alert and oriented to person, place, and time.  Weakness of the right forehead muscles and the muscles around the right side of mouth          Assessment & Plan:  Bells palsy. He will finish up the meds. This should resolve on its own over the next 4-8 weeks. We will recheck this next month at his cpx.

## 2013-06-05 ENCOUNTER — Ambulatory Visit (INDEPENDENT_AMBULATORY_CARE_PROVIDER_SITE_OTHER): Payer: Federal, State, Local not specified - PPO | Admitting: Family Medicine

## 2013-06-05 ENCOUNTER — Encounter: Payer: Self-pay | Admitting: Family Medicine

## 2013-06-05 VITALS — BP 120/80 | Temp 100.2°F | Ht 78.0 in | Wt 200.0 lb

## 2013-06-05 DIAGNOSIS — A084 Viral intestinal infection, unspecified: Secondary | ICD-10-CM

## 2013-06-05 DIAGNOSIS — A088 Other specified intestinal infections: Secondary | ICD-10-CM

## 2013-06-05 NOTE — Progress Notes (Signed)
Pre visit review using our clinic review tool, if applicable. No additional management support is needed unless otherwise documented below in the visit note. 

## 2013-06-05 NOTE — Progress Notes (Signed)
   Subjective:    Patient ID: Brian Bond, male    DOB: May 21, 1940, 73 y.o.   MRN: [id number]  HPI Here for 3 days of low grade fevers, nausea and vomiting, and some diarrhea. No stomach cramps. No cough or ST. He feels much better today and was able to eat some food this morning. Drinking fluids.    Review of Systems  Constitutional: Positive for fever.  Respiratory: Negative.   Cardiovascular: Negative.   Gastrointestinal: Positive for nausea, vomiting and diarrhea. Negative for abdominal pain, constipation, blood in stool, abdominal distention and anal bleeding.       Objective:   Physical Exam  Constitutional: He appears well-developed and well-nourished. No distress.  Neck: No thyromegaly present.  Cardiovascular: Normal rate, regular rhythm, normal heart sounds and intact distal pulses.   Pulmonary/Chest: Effort normal and breath sounds normal.  Abdominal: Soft. Bowel sounds are normal. He exhibits no distension and no mass. There is no tenderness. There is no rebound and no guarding.  Lymphadenopathy:    He has no cervical adenopathy.          Assessment & Plan:  Viral enteritis. Rest, drink fluids. Use Tylenol prn. Written out of work today and tomorrow

## 2013-06-12 ENCOUNTER — Other Ambulatory Visit (INDEPENDENT_AMBULATORY_CARE_PROVIDER_SITE_OTHER): Payer: Federal, State, Local not specified - PPO

## 2013-06-12 DIAGNOSIS — Z0000 Encounter for general adult medical examination without abnormal findings: Secondary | ICD-10-CM

## 2013-06-12 LAB — POCT URINALYSIS DIPSTICK
BILIRUBIN UA: NEGATIVE
Glucose, UA: NEGATIVE
KETONES UA: NEGATIVE
LEUKOCYTES UA: NEGATIVE
Nitrite, UA: NEGATIVE
PH UA: 6
Protein, UA: NEGATIVE
RBC UA: NEGATIVE
SPEC GRAV UA: 1.02
Urobilinogen, UA: 0.2

## 2013-06-12 LAB — CBC WITH DIFFERENTIAL/PLATELET
BASOS PCT: 0.4 % (ref 0.0–3.0)
Basophils Absolute: 0 10*3/uL (ref 0.0–0.1)
EOS PCT: 0.8 % (ref 0.0–5.0)
Eosinophils Absolute: 0.1 10*3/uL (ref 0.0–0.7)
HCT: 45 % (ref 39.0–52.0)
HEMOGLOBIN: 15 g/dL (ref 13.0–17.0)
LYMPHS PCT: 22.1 % (ref 12.0–46.0)
Lymphs Abs: 1.6 10*3/uL (ref 0.7–4.0)
MCHC: 33.3 g/dL (ref 30.0–36.0)
MCV: 91.4 fl (ref 78.0–100.0)
Monocytes Absolute: 0.5 10*3/uL (ref 0.1–1.0)
Monocytes Relative: 7.4 % (ref 3.0–12.0)
NEUTROS PCT: 69.3 % (ref 43.0–77.0)
Neutro Abs: 5 10*3/uL (ref 1.4–7.7)
Platelets: 309 10*3/uL (ref 150.0–400.0)
RBC: 4.92 Mil/uL (ref 4.22–5.81)
RDW: 13.7 % (ref 11.5–14.6)
WBC: 7.2 10*3/uL (ref 4.5–10.5)

## 2013-06-12 LAB — BASIC METABOLIC PANEL
BUN: 12 mg/dL (ref 6–23)
CALCIUM: 9.2 mg/dL (ref 8.4–10.5)
CHLORIDE: 107 meq/L (ref 96–112)
CO2: 28 meq/L (ref 19–32)
Creatinine, Ser: 1.2 mg/dL (ref 0.4–1.5)
GFR: 78.89 mL/min (ref >60.00)
Glucose, Bld: 109 mg/dL — ABNORMAL HIGH (ref 70–99)
Potassium: 5 mEq/L (ref 3.5–5.1)
SODIUM: 141 meq/L (ref 135–145)

## 2013-06-12 LAB — LIPID PANEL
CHOL/HDL RATIO: 4
Cholesterol: 182 mg/dL (ref 0–200)
HDL: 42.8 mg/dL (ref >39.00)
LDL CALC: 123 mg/dL — AB (ref 0–99)
Triglycerides: 81 mg/dL (ref 0.0–149.0)
VLDL: 16.2 mg/dL (ref 0.0–40.0)

## 2013-06-12 LAB — TSH: TSH: 0.78 u[IU]/mL (ref 0.35–5.50)

## 2013-06-12 LAB — HEPATIC FUNCTION PANEL
ALK PHOS: 48 U/L (ref 39–117)
ALT: 28 U/L (ref 0–53)
AST: 29 U/L (ref 0–37)
Albumin: 3.4 g/dL — ABNORMAL LOW (ref 3.5–5.2)
BILIRUBIN DIRECT: 0.2 mg/dL (ref 0.0–0.3)
Total Bilirubin: 0.9 mg/dL (ref 0.3–1.2)
Total Protein: 6.8 g/dL (ref 6.0–8.3)

## 2013-06-12 LAB — PSA: PSA: 3.4 ng/mL (ref 0.10–4.00)

## 2013-06-13 ENCOUNTER — Other Ambulatory Visit: Payer: Federal, State, Local not specified - PPO

## 2013-06-20 ENCOUNTER — Encounter: Payer: Self-pay | Admitting: Family Medicine

## 2013-06-20 ENCOUNTER — Ambulatory Visit (INDEPENDENT_AMBULATORY_CARE_PROVIDER_SITE_OTHER): Payer: Federal, State, Local not specified - PPO | Admitting: Family Medicine

## 2013-06-20 VITALS — BP 100/66 | HR 75 | Temp 98.4°F | Ht 76.25 in | Wt 205.0 lb

## 2013-06-20 DIAGNOSIS — Z0000 Encounter for general adult medical examination without abnormal findings: Secondary | ICD-10-CM

## 2013-06-20 MED ORDER — OXYCODONE-ACETAMINOPHEN 7.5-325 MG PO TABS: 1.0000 | ORAL_TABLET | Freq: Four times a day (QID) | ORAL | Status: DC | PRN

## 2013-06-20 NOTE — Progress Notes (Signed)
Pre visit review using our clinic review tool, if applicable. No additional management support is needed unless otherwise documented below in the visit note. 

## 2013-06-20 NOTE — Progress Notes (Signed)
   Subjective:    Patient ID: Brian Bond, male    DOB: [birth year], 73 y.o.   MRN: [id number]  HPI 73 yr old male for a cpx. He feels fine. He has recovered from a bout of Bells palsy. This kept him out of work on 05-23-13 and 05-24-13.     Review of Systems  Constitutional: Negative.   HENT: Negative.   Eyes: Negative.   Respiratory: Negative.   Cardiovascular: Negative.   Gastrointestinal: Negative.   Genitourinary: Negative.   Musculoskeletal: Negative.   Skin: Negative.   Neurological: Negative.   Psychiatric/Behavioral: Negative.        Objective:   Physical Exam  Constitutional: He is oriented to person, place, and time. He appears well-developed and well-nourished. No distress.  HENT:  Head: Normocephalic and atraumatic.  Right Ear: External ear normal.  Left Ear: External ear normal.  Nose: Nose normal.  Mouth/Throat: Oropharynx is clear and moist. No oropharyngeal exudate.  Eyes: Conjunctivae and EOM are normal. Pupils are equal, round, and reactive to light. Right eye exhibits no discharge. Left eye exhibits no discharge. No scleral icterus.  Neck: Neck supple. No JVD present. No tracheal deviation present. No thyromegaly present.  Cardiovascular: Normal rate, regular rhythm, normal heart sounds and intact distal pulses.  Exam reveals no gallop and no friction rub.   No murmur heard. EKG normal  Pulmonary/Chest: Effort normal and breath sounds normal. No respiratory distress. He has no wheezes. He has no rales. He exhibits no tenderness.  Abdominal: Soft. Bowel sounds are normal. He exhibits no distension and no mass. There is no tenderness. There is no rebound and no guarding.  Genitourinary: Rectum normal, prostate normal and penis normal. Guaiac negative stool. No penile tenderness.  Musculoskeletal: Normal range of motion. He exhibits no edema and no tenderness.  Lymphadenopathy:    He has no cervical adenopathy.  Neurological: He is alert and oriented to  person, place, and time. He has normal reflexes. No cranial nerve deficit. He exhibits normal muscle tone. Coordination normal.  Skin: Skin is warm and dry. No rash noted. He is not diaphoretic. No erythema. No pallor.  Psychiatric: He has a normal mood and affect. His behavior is normal. Judgment and thought content normal.          Assessment & Plan:  Well exam.

## 2013-06-30 ENCOUNTER — Telehealth: Payer: Self-pay | Admitting: Family Medicine

## 2013-06-30 NOTE — Telephone Encounter (Signed)
error 

## 2013-07-18 ENCOUNTER — Encounter: Payer: Federal, State, Local not specified - PPO | Admitting: Family Medicine

## 2013-08-10 ENCOUNTER — Other Ambulatory Visit: Payer: Self-pay | Admitting: Family Medicine

## 2013-08-11 NOTE — Telephone Encounter (Signed)
Call in #[phone] with 5 rf 

## 2013-09-18 ENCOUNTER — Telehealth: Payer: Self-pay | Admitting: Family Medicine

## 2013-09-18 NOTE — Telephone Encounter (Signed)
Pt request new rx oxyCODONE-acetaminophen (PERCOCET) 7.5-325 MG per tablet

## 2013-09-20 MED ORDER — OXYCODONE-ACETAMINOPHEN 7.5-325 MG PO TABS: 1.0000 | ORAL_TABLET | Freq: Four times a day (QID) | ORAL | Status: DC | PRN

## 2013-09-20 MED ORDER — OXYCODONE-ACETAMINOPHEN 7.5-325 MG PO TABS
1.0000 | ORAL_TABLET | Freq: Four times a day (QID) | ORAL | Status: DC | PRN
Start: 2013-09-20 — End: 2013-09-20

## 2013-09-20 NOTE — Telephone Encounter (Signed)
Script is ready for pick up and I left a voice message for pt. 

## 2013-09-20 NOTE — Telephone Encounter (Signed)
done

## 2013-12-20 ENCOUNTER — Telehealth: Payer: Self-pay | Admitting: Family Medicine

## 2013-12-20 MED ORDER — OXYCODONE-ACETAMINOPHEN 7.5-325 MG PO TABS: 1.0000 | ORAL_TABLET | Freq: Four times a day (QID) | ORAL | Status: DC | PRN

## 2013-12-20 NOTE — Telephone Encounter (Signed)
Pt needs re-fill on oxyCODONE-acetaminophen (PERCOCET) 7.5-325 MG per tablet ° °

## 2013-12-20 NOTE — Telephone Encounter (Signed)
Script is ready for pick up and I left a voice message for pt. 

## 2013-12-20 NOTE — Telephone Encounter (Signed)
done

## 2014-02-09 ENCOUNTER — Other Ambulatory Visit: Payer: Self-pay | Admitting: Family Medicine

## 2014-02-14 NOTE — Telephone Encounter (Signed)
Called to the pharmacy and left on voicemail. 

## 2014-02-14 NOTE — Telephone Encounter (Signed)
Call in #[phone] with 5 rf 

## 2014-03-22 ENCOUNTER — Telehealth: Payer: Self-pay | Admitting: Family Medicine

## 2014-03-22 NOTE — Telephone Encounter (Signed)
Pt request refill oxyCODONE-acetaminophen (PERCOCET) 7.5-325 MG per tablet ° °

## 2014-03-23 MED ORDER — OXYCODONE-ACETAMINOPHEN 7.5-325 MG PO TABS: 1.0000 | ORAL_TABLET | Freq: Four times a day (QID) | ORAL | Status: DC | PRN

## 2014-03-23 NOTE — Telephone Encounter (Signed)
done

## 2014-03-26 NOTE — Telephone Encounter (Signed)
Script is ready for pick up and I left a voice message for pt. 

## 2014-06-07 ENCOUNTER — Other Ambulatory Visit: Payer: Self-pay | Admitting: Family Medicine

## 2014-06-25 ENCOUNTER — Telehealth: Payer: Self-pay | Admitting: Family Medicine

## 2014-06-25 MED ORDER — OXYCODONE-ACETAMINOPHEN 7.5-325 MG PO TABS: 1.0000 | ORAL_TABLET | Freq: Four times a day (QID) | ORAL | Status: DC | PRN

## 2014-06-25 NOTE — Telephone Encounter (Signed)
Pt request refill of the following: oxyCODONE-acetaminophen (PERCOCET) 7.5-325 MG per tablet   Phamacy:

## 2014-06-25 NOTE — Telephone Encounter (Signed)
Script is ready for pick up, tried to reach pt and no answer.  

## 2014-06-25 NOTE — Telephone Encounter (Signed)
done

## 2014-06-28 NOTE — Telephone Encounter (Signed)
Patient called back and is aware RX is ready for pick-up.

## 2014-08-11 ENCOUNTER — Other Ambulatory Visit: Payer: Self-pay | Admitting: Family Medicine

## 2014-08-20 ENCOUNTER — Other Ambulatory Visit: Payer: Self-pay | Admitting: Family Medicine

## 2014-08-20 ENCOUNTER — Telehealth: Payer: Self-pay | Admitting: Family Medicine

## 2014-08-20 NOTE — Telephone Encounter (Signed)
Refill for 6 months. 

## 2014-08-20 NOTE — Telephone Encounter (Signed)
Pt has been scheduled for a phy and is requesting a refill on the following med    temazepam (RESTORIL) 30 MG capsule   [facility]

## 2014-08-21 MED ORDER — TEMAZEPAM 30 MG PO CAPS: ORAL_CAPSULE | ORAL | Status: DC

## 2014-08-21 NOTE — Telephone Encounter (Signed)
I called in script 

## 2014-08-21 NOTE — Addendum Note (Signed)
Addended by: Aniceto Boss A on: 08/21/2014 01:53 PM   Modules accepted: Orders

## 2014-08-28 ENCOUNTER — Other Ambulatory Visit: Payer: Federal, State, Local not specified - PPO

## 2014-08-29 ENCOUNTER — Other Ambulatory Visit (INDEPENDENT_AMBULATORY_CARE_PROVIDER_SITE_OTHER): Payer: Federal, State, Local not specified - PPO

## 2014-08-29 DIAGNOSIS — Z0000 Encounter for general adult medical examination without abnormal findings: Secondary | ICD-10-CM

## 2014-08-29 LAB — COMPREHENSIVE METABOLIC PANEL
ALT: 29 U/L (ref 0–53)
AST: 32 U/L (ref 0–37)
Albumin: 3.9 g/dL (ref 3.5–5.2)
Alkaline Phosphatase: 51 U/L (ref 39–117)
BUN: 15 mg/dL (ref 6–23)
CO2: 25 meq/L (ref 19–32)
Calcium: 9 mg/dL (ref 8.4–10.5)
Chloride: 109 mEq/L (ref 96–112)
Creatinine, Ser: 1.14 mg/dL (ref 0.40–1.50)
GFR: 81.01 mL/min (ref >60.00)
Glucose, Bld: 90 mg/dL (ref 70–99)
Potassium: 4 mEq/L (ref 3.5–5.1)
SODIUM: 140 meq/L (ref 135–145)
TOTAL PROTEIN: 7.1 g/dL (ref 6.0–8.3)
Total Bilirubin: 0.8 mg/dL (ref 0.2–1.2)

## 2014-08-29 LAB — LIPID PANEL
Cholesterol: 145 mg/dL (ref 0–200)
HDL: 46.1 mg/dL (ref >39.00)
LDL CALC: 87 mg/dL (ref 0–99)
NonHDL: 98.9
TRIGLYCERIDES: 60 mg/dL (ref 0.0–149.0)
Total CHOL/HDL Ratio: 3
VLDL: 12 mg/dL (ref 0.0–40.0)

## 2014-08-29 LAB — CBC WITH DIFFERENTIAL/PLATELET
BASOS PCT: 0.5 % (ref 0.0–3.0)
Basophils Absolute: 0 10*3/uL (ref 0.0–0.1)
EOS ABS: 0.2 10*3/uL (ref 0.0–0.7)
Eosinophils Relative: 5.4 % — ABNORMAL HIGH (ref 0.0–5.0)
HCT: 45.9 % (ref 39.0–52.0)
Hemoglobin: 15.3 g/dL (ref 13.0–17.0)
LYMPHS PCT: 35 % (ref 12.0–46.0)
Lymphs Abs: 1.3 10*3/uL (ref 0.7–4.0)
MCHC: 33.4 g/dL (ref 30.0–36.0)
MCV: 91 fl (ref 78.0–100.0)
Monocytes Absolute: 0.3 10*3/uL (ref 0.1–1.0)
Monocytes Relative: 8.9 % (ref 3.0–12.0)
NEUTROS ABS: 1.9 10*3/uL (ref 1.4–7.7)
Neutrophils Relative %: 50.2 % (ref 43.0–77.0)
Platelets: 204 10*3/uL (ref 150.0–400.0)
RBC: 5.04 Mil/uL (ref 4.22–5.81)
RDW: 13.6 % (ref 11.5–15.5)
WBC: 3.8 10*3/uL — AB (ref 4.0–10.5)

## 2014-08-29 LAB — POCT URINALYSIS DIPSTICK
BILIRUBIN UA: NEGATIVE
Blood, UA: NEGATIVE
GLUCOSE UA: NEGATIVE
Ketones, UA: NEGATIVE
LEUKOCYTES UA: NEGATIVE
Nitrite, UA: NEGATIVE
Protein, UA: NEGATIVE
Spec Grav, UA: 1.01
UROBILINOGEN UA: 0.2
pH, UA: 5.5

## 2014-08-29 LAB — PSA: PSA: 2.5 ng/mL (ref 0.10–4.00)

## 2014-08-29 LAB — TSH: TSH: 0.79 u[IU]/mL (ref 0.35–4.50)

## 2014-09-04 ENCOUNTER — Ambulatory Visit (INDEPENDENT_AMBULATORY_CARE_PROVIDER_SITE_OTHER): Payer: Federal, State, Local not specified - PPO | Admitting: Family Medicine

## 2014-09-04 ENCOUNTER — Encounter: Payer: Self-pay | Admitting: Family Medicine

## 2014-09-04 VITALS — BP 100/55 | HR 51 | Temp 98.9°F | Ht 76.25 in | Wt 200.0 lb

## 2014-09-04 DIAGNOSIS — Z0000 Encounter for general adult medical examination without abnormal findings: Secondary | ICD-10-CM

## 2014-09-04 MED ORDER — SILDENAFIL CITRATE 100 MG PO TABS: 100.0000 mg | ORAL_TABLET | ORAL | Status: DC | PRN

## 2014-09-04 MED ORDER — TAMSULOSIN HCL 0.4 MG PO CAPS: 0.8000 mg | ORAL_CAPSULE | Freq: Every day | ORAL | Status: DC

## 2014-09-04 NOTE — Progress Notes (Signed)
   Subjective:    Patient ID: Brian Bond, male    DOB: [birth year], 74 y.o.   MRN: [id number]  HPI 74 yr old male for a cpx. He feels well. He has lost a little weight. He still works full time.    Review of Systems  Constitutional: Negative.   HENT: Negative.   Eyes: Negative.   Respiratory: Negative.   Cardiovascular: Negative.   Gastrointestinal: Negative.   Genitourinary: Negative.   Musculoskeletal: Negative.   Skin: Negative.   Neurological: Negative.   Psychiatric/Behavioral: Negative.        Objective:   Physical Exam  Constitutional: He is oriented to person, place, and time. He appears well-developed and well-nourished. No distress.  HENT:  Head: Normocephalic and atraumatic.  Right Ear: External ear normal.  Left Ear: External ear normal.  Nose: Nose normal.  Mouth/Throat: Oropharynx is clear and moist. No oropharyngeal exudate.  Eyes: Conjunctivae and EOM are normal. Pupils are equal, round, and reactive to light. Right eye exhibits no discharge. Left eye exhibits no discharge. No scleral icterus.  Neck: Neck supple. No JVD present. No tracheal deviation present. No thyromegaly present.  Cardiovascular: Normal rate, regular rhythm, normal heart sounds and intact distal pulses.  Exam reveals no gallop and no friction rub.   No murmur heard. EKG normal   Pulmonary/Chest: Effort normal and breath sounds normal. No respiratory distress. He has no wheezes. He has no rales. He exhibits no tenderness.  Abdominal: Soft. Bowel sounds are normal. He exhibits no distension and no mass. There is no tenderness. There is no rebound and no guarding.  Genitourinary: Rectum normal, prostate normal and penis normal. Guaiac negative stool. No penile tenderness.  Musculoskeletal: Normal range of motion. He exhibits no edema or tenderness.  Lymphadenopathy:    He has no cervical adenopathy.  Neurological: He is alert and oriented to person, place, and time. He has normal  reflexes. No cranial nerve deficit. He exhibits normal muscle tone. Coordination normal.  Skin: Skin is warm and dry. No rash noted. He is not diaphoretic. No erythema. No pallor.  Psychiatric: He has a normal mood and affect. His behavior is normal. Judgment and thought content normal.          Assessment & Plan:  Well exam. We reviewed diet and exercise recommendations.

## 2014-09-04 NOTE — Progress Notes (Signed)
Pre visit review using our clinic review tool, if applicable. No additional management support is needed unless otherwise documented below in the visit note. 

## 2014-09-25 ENCOUNTER — Telehealth: Payer: Self-pay | Admitting: Family Medicine

## 2014-09-25 NOTE — Telephone Encounter (Signed)
Pt needs new rx percocet °

## 2014-09-26 MED ORDER — OXYCODONE-ACETAMINOPHEN 7.5-325 MG PO TABS: 1.0000 | ORAL_TABLET | Freq: Four times a day (QID) | ORAL | Status: DC | PRN

## 2014-09-26 NOTE — Telephone Encounter (Signed)
Script is ready for pick up and I left a voice message for pt. 

## 2014-09-26 NOTE — Telephone Encounter (Signed)
done

## 2014-12-25 ENCOUNTER — Telehealth: Payer: Self-pay | Admitting: Family Medicine

## 2014-12-25 MED ORDER — OXYCODONE-ACETAMINOPHEN 10-325 MG PO TABS: 1.0000 | ORAL_TABLET | Freq: Four times a day (QID) | ORAL | Status: DC | PRN

## 2014-12-25 NOTE — Telephone Encounter (Signed)
done

## 2014-12-25 NOTE — Telephone Encounter (Signed)
Pt would like to increase oxycodone 7.5 mg-325 to 10 mg-325.

## 2014-12-26 NOTE — Telephone Encounter (Signed)
Called and spoke with pt and pt is aware.  

## 2015-02-19 ENCOUNTER — Other Ambulatory Visit: Payer: Self-pay | Admitting: Family Medicine

## 2015-02-20 NOTE — Telephone Encounter (Signed)
Call in #[phone] with 5 rf 

## 2015-03-29 ENCOUNTER — Telehealth: Payer: Self-pay | Admitting: Family Medicine

## 2015-03-29 MED ORDER — OXYCODONE-ACETAMINOPHEN 10-325 MG PO TABS: 1.0000 | ORAL_TABLET | Freq: Four times a day (QID) | ORAL | Status: DC | PRN

## 2015-03-29 NOTE — Telephone Encounter (Signed)
Pt needs new rx oxycodone °

## 2015-03-29 NOTE — Telephone Encounter (Signed)
done

## 2015-04-01 NOTE — Telephone Encounter (Signed)
Script is ready for pick up and I left a voice message for pt. 

## 2015-06-25 DIAGNOSIS — M7741 Metatarsalgia, right foot: Secondary | ICD-10-CM | POA: Diagnosis not present

## 2015-06-25 DIAGNOSIS — D2371 Other benign neoplasm of skin of right lower limb, including hip: Secondary | ICD-10-CM | POA: Diagnosis not present

## 2015-06-25 DIAGNOSIS — L858 Other specified epidermal thickening: Secondary | ICD-10-CM | POA: Diagnosis not present

## 2015-07-01 ENCOUNTER — Telehealth: Payer: Self-pay | Admitting: Family Medicine

## 2015-07-01 MED ORDER — OXYCODONE-ACETAMINOPHEN 10-325 MG PO TABS: 1.0000 | ORAL_TABLET | Freq: Four times a day (QID) | ORAL | Status: DC | PRN

## 2015-07-01 NOTE — Telephone Encounter (Signed)
Script is ready for pick up, tried to reach pt and no answer.  

## 2015-07-01 NOTE — Telephone Encounter (Signed)
done

## 2015-07-01 NOTE — Telephone Encounter (Signed)
° ° ° ° °  Pt request refill of the following: ° °oxyCODONE-acetaminophen (PERCOCET) 10-325 MG tablet ° °Phamacy: °

## 2015-08-22 ENCOUNTER — Other Ambulatory Visit: Payer: Self-pay | Admitting: Family Medicine

## 2015-08-22 NOTE — Telephone Encounter (Signed)
Ok to refill 

## 2015-08-23 NOTE — Telephone Encounter (Signed)
Refill for 6 months. 

## 2015-08-29 ENCOUNTER — Other Ambulatory Visit (INDEPENDENT_AMBULATORY_CARE_PROVIDER_SITE_OTHER): Payer: Federal, State, Local not specified - PPO

## 2015-08-29 DIAGNOSIS — Z0000 Encounter for general adult medical examination without abnormal findings: Secondary | ICD-10-CM

## 2015-08-29 LAB — CBC WITH DIFFERENTIAL/PLATELET
BASOS ABS: 0 10*3/uL (ref 0.0–0.1)
Basophils Relative: 0.4 % (ref 0.0–3.0)
EOS ABS: 0.1 10*3/uL (ref 0.0–0.7)
Eosinophils Relative: 1.6 % (ref 0.0–5.0)
HEMATOCRIT: 44.7 % (ref 39.0–52.0)
HEMOGLOBIN: 14.8 g/dL (ref 13.0–17.0)
LYMPHS PCT: 17 % (ref 12.0–46.0)
Lymphs Abs: 1.3 10*3/uL (ref 0.7–4.0)
MCHC: 33 g/dL (ref 30.0–36.0)
MCV: 90.3 fl (ref 78.0–100.0)
MONO ABS: 0.4 10*3/uL (ref 0.1–1.0)
Monocytes Relative: 5.9 % (ref 3.0–12.0)
Neutro Abs: 5.6 10*3/uL (ref 1.4–7.7)
Neutrophils Relative %: 75.1 % (ref 43.0–77.0)
Platelets: 199 10*3/uL (ref 150.0–400.0)
RBC: 4.95 Mil/uL (ref 4.22–5.81)
RDW: 13.5 % (ref 11.5–15.5)
WBC: 7.5 10*3/uL (ref 4.0–10.5)

## 2015-08-29 LAB — HEPATIC FUNCTION PANEL
ALBUMIN: 4 g/dL (ref 3.5–5.2)
ALT: 27 U/L (ref 0–53)
AST: 30 U/L (ref 0–37)
Alkaline Phosphatase: 46 U/L (ref 39–117)
Bilirubin, Direct: 0.2 mg/dL (ref 0.0–0.3)
TOTAL PROTEIN: 6.8 g/dL (ref 6.0–8.3)
Total Bilirubin: 1.1 mg/dL (ref 0.2–1.2)

## 2015-08-29 LAB — POC URINALSYSI DIPSTICK (AUTOMATED)
Bilirubin, UA: NEGATIVE
Glucose, UA: NEGATIVE
Ketones, UA: NEGATIVE
Leukocytes, UA: NEGATIVE
NITRITE UA: NEGATIVE
PH UA: 6
Protein, UA: NEGATIVE
RBC UA: NEGATIVE
SPEC GRAV UA: 1.02
UROBILINOGEN UA: 1

## 2015-08-29 LAB — BASIC METABOLIC PANEL
BUN: 20 mg/dL (ref 6–23)
CHLORIDE: 105 meq/L (ref 96–112)
CO2: 32 meq/L (ref 19–32)
Calcium: 9 mg/dL (ref 8.4–10.5)
Creatinine, Ser: 1.08 mg/dL (ref 0.40–1.50)
GFR: 85.99 mL/min (ref >60.00)
GLUCOSE: 89 mg/dL (ref 70–99)
POTASSIUM: 4.3 meq/L (ref 3.5–5.1)
SODIUM: 141 meq/L (ref 135–145)

## 2015-08-29 LAB — LIPID PANEL
CHOLESTEROL: 145 mg/dL (ref 0–200)
HDL: 52 mg/dL (ref >39.00)
LDL Cholesterol: 85 mg/dL (ref 0–99)
NonHDL: 93.3
Total CHOL/HDL Ratio: 3
Triglycerides: 42 mg/dL (ref 0.0–149.0)
VLDL: 8.4 mg/dL (ref 0.0–40.0)

## 2015-08-29 LAB — TSH: TSH: 0.38 u[IU]/mL (ref 0.35–4.50)

## 2015-08-29 LAB — PSA: PSA: 2.97 ng/mL (ref 0.10–4.00)

## 2015-09-04 ENCOUNTER — Encounter: Payer: Federal, State, Local not specified - PPO | Admitting: Family Medicine

## 2015-09-06 ENCOUNTER — Ambulatory Visit (INDEPENDENT_AMBULATORY_CARE_PROVIDER_SITE_OTHER): Payer: Federal, State, Local not specified - PPO | Admitting: Family Medicine

## 2015-09-06 ENCOUNTER — Encounter: Payer: Self-pay | Admitting: Family Medicine

## 2015-09-06 VITALS — BP 134/82 | HR 56 | Temp 98.7°F | Ht 76.25 in | Wt 208.0 lb

## 2015-09-06 DIAGNOSIS — Z0000 Encounter for general adult medical examination without abnormal findings: Secondary | ICD-10-CM | POA: Diagnosis not present

## 2015-09-06 MED ORDER — VARDENAFIL HCL 20 MG PO TABS: 20.0000 mg | ORAL_TABLET | Freq: Every day | ORAL | Status: DC | PRN

## 2015-09-06 MED ORDER — TAMSULOSIN HCL 0.4 MG PO CAPS: 0.8000 mg | ORAL_CAPSULE | Freq: Every day | ORAL | Status: DC

## 2015-09-06 NOTE — Progress Notes (Signed)
   Subjective:    Patient ID: Brian Bond, male    DOB: April 14, 1940, 75 y.o.   MRN: [id number]  HPI 75 yr old male for a well exam. He is doing well in general. His son does most of the cooking and he has a healthy diet. He still works full time. He has had trouble with pain in the ball of the right foot for several years. He saw Dr. Dian SituZigler a few times but was not happy with the results. He wants to try Levitra instead of Viagra.    Review of Systems  Constitutional: Negative.   HENT: Negative.   Eyes: Negative.   Respiratory: Negative.   Cardiovascular: Negative.   Gastrointestinal: Negative.   Genitourinary: Negative.   Musculoskeletal: Negative.   Skin: Negative.   Neurological: Negative.   Psychiatric/Behavioral: Negative.        Objective:   Physical Exam  Constitutional: He is oriented to person, place, and time. He appears well-developed and well-nourished. No distress.  HENT:  Head: Normocephalic and atraumatic.  Right Ear: External ear normal.  Left Ear: External ear normal.  Nose: Nose normal.  Mouth/Throat: Oropharynx is clear and moist. No oropharyngeal exudate.  Eyes: Conjunctivae and EOM are normal. Pupils are equal, round, and reactive to light. Right eye exhibits no discharge. Left eye exhibits no discharge. No scleral icterus.  Neck: Neck supple. No JVD present. No tracheal deviation present. No thyromegaly present.  Cardiovascular: Normal rate, regular rhythm, normal heart sounds and intact distal pulses.  Exam reveals no gallop and no friction rub.   No murmur heard. EKG normal   Pulmonary/Chest: Effort normal and breath sounds normal. No respiratory distress. He has no wheezes. He has no rales. He exhibits no tenderness.  Abdominal: Soft. Bowel sounds are normal. He exhibits no distension and no mass. There is no tenderness. There is no rebound and no guarding.  Genitourinary: Rectum normal, prostate normal and penis normal. Guaiac negative stool. No  penile tenderness.  Musculoskeletal: Normal range of motion. He exhibits no edema or tenderness.  Lymphadenopathy:    He has no cervical adenopathy.  Neurological: He is alert and oriented to person, place, and time. He has normal reflexes. No cranial nerve deficit. He exhibits normal muscle tone. Coordination normal.  Skin: Skin is warm and dry. No rash noted. He is not diaphoretic. No erythema. No pallor.  Psychiatric: He has a normal mood and affect. His behavior is normal. Judgment and thought content normal.          Assessment & Plan:  Well exam. Refer to Dr. Orlene OchNorm Regal for the foot pain. Try Levitra for the ED.  Nelwyn SalisburyFRY,Shonna Deiter A, MD

## 2015-09-11 ENCOUNTER — Other Ambulatory Visit: Payer: Self-pay | Admitting: Family Medicine

## 2015-09-30 ENCOUNTER — Telehealth: Payer: Self-pay | Admitting: Family Medicine

## 2015-09-30 MED ORDER — OXYCODONE-ACETAMINOPHEN 10-325 MG PO TABS: 1.0000 | ORAL_TABLET | Freq: Four times a day (QID) | ORAL | 0 refills | Status: DC | PRN

## 2015-09-30 NOTE — Telephone Encounter (Signed)
Scripts are ready for pick up here at front office, left pt a voice message with this information.

## 2015-09-30 NOTE — Telephone Encounter (Signed)
Pt request refill  °oxyCODONE-acetaminophen (PERCOCET) 10-325 MG tablet °

## 2015-12-31 ENCOUNTER — Telehealth: Payer: Self-pay | Admitting: Family Medicine

## 2015-12-31 MED ORDER — OXYCODONE-ACETAMINOPHEN 10-325 MG PO TABS: 1.0000 | ORAL_TABLET | Freq: Four times a day (QID) | ORAL | 0 refills | Status: DC | PRN

## 2015-12-31 NOTE — Telephone Encounter (Signed)
Pt need new Rx for PERCOCET °

## 2015-12-31 NOTE — Telephone Encounter (Signed)
Script is ready for pick up and I left a voice message for pt with this information.  

## 2015-12-31 NOTE — Telephone Encounter (Signed)
done

## 2016-02-20 ENCOUNTER — Other Ambulatory Visit: Payer: Self-pay | Admitting: Family Medicine

## 2016-02-20 NOTE — Telephone Encounter (Signed)
Call in #[phone] with 5 rf 

## 2016-03-31 ENCOUNTER — Telehealth: Payer: Self-pay | Admitting: Family Medicine

## 2016-03-31 NOTE — Telephone Encounter (Signed)
Pt request refill  °oxyCODONE-acetaminophen (PERCOCET) 10-325 MG tablet °

## 2016-04-01 MED ORDER — OXYCODONE-ACETAMINOPHEN 10-325 MG PO TABS: 1.0000 | ORAL_TABLET | Freq: Four times a day (QID) | ORAL | 0 refills | Status: DC | PRN

## 2016-04-01 NOTE — Telephone Encounter (Signed)
done

## 2016-04-01 NOTE — Telephone Encounter (Signed)
Script is ready for pick up here at front office and I left a voice message with this information for pt.  

## 2016-05-06 DIAGNOSIS — K080 Exfoliation of teeth due to systemic causes: Secondary | ICD-10-CM | POA: Diagnosis not present

## 2016-05-13 DIAGNOSIS — K080 Exfoliation of teeth due to systemic causes: Secondary | ICD-10-CM | POA: Diagnosis not present

## 2016-06-29 ENCOUNTER — Telehealth: Payer: Self-pay | Admitting: Family Medicine

## 2016-06-29 MED ORDER — OXYCODONE-ACETAMINOPHEN 10-325 MG PO TABS: 1.0000 | ORAL_TABLET | Freq: Four times a day (QID) | ORAL | 0 refills | Status: DC | PRN

## 2016-06-29 NOTE — Telephone Encounter (Signed)
Ready to pick up. We can only write for one month at a time.

## 2016-06-29 NOTE — Telephone Encounter (Signed)
Called patient and left a message for patient to return my call back in the office.

## 2016-06-29 NOTE — Telephone Encounter (Signed)
Pt need new Rx for PERCOCET  Pt is aware of 3 business days for refills and someone will call when ready for pick up.

## 2016-06-30 NOTE — Telephone Encounter (Signed)
Spoke to patient regarding to his Rx for percocet, aware to pick up at the front desk.

## 2016-07-28 ENCOUNTER — Telehealth: Payer: Self-pay | Admitting: Family Medicine

## 2016-07-28 NOTE — Telephone Encounter (Signed)
Pt need new Rx for Oxycodone   Pt is aware of 3 business days for refills and someone will call when ready for pick up. °

## 2016-07-30 MED ORDER — OXYCODONE-ACETAMINOPHEN 10-325 MG PO TABS: 1.0000 | ORAL_TABLET | Freq: Four times a day (QID) | ORAL | 0 refills | Status: DC | PRN

## 2016-07-30 NOTE — Telephone Encounter (Signed)
Pt is calling and is aware the Rx is ready for pick-up and will be by here before 5pm

## 2016-07-30 NOTE — Telephone Encounter (Signed)
done

## 2016-07-30 NOTE — Telephone Encounter (Signed)
Script is ready for pick up here at front office, tried to reach pt and no answer.  

## 2016-08-16 ENCOUNTER — Other Ambulatory Visit: Payer: Self-pay | Admitting: Family Medicine

## 2016-08-17 NOTE — Telephone Encounter (Signed)
Call in #[phone] with 5 rf 

## 2016-08-31 ENCOUNTER — Telehealth: Payer: Self-pay | Admitting: Family Medicine

## 2016-08-31 MED ORDER — OXYCODONE-ACETAMINOPHEN 10-325 MG PO TABS: 1.0000 | ORAL_TABLET | Freq: Four times a day (QID) | ORAL | 0 refills | Status: DC | PRN

## 2016-08-31 NOTE — Telephone Encounter (Signed)
Pt need new Rx for Oxycodone   Pt is aware of 3 business days for refills and someone will call when ready for pick up. °

## 2016-08-31 NOTE — Telephone Encounter (Signed)
And Dr. Claris CheFry's absence okay to refill 2 weeks worth of medicine. Dispense #56 further refills per Dr. Clent RidgesFry

## 2016-08-31 NOTE — Telephone Encounter (Signed)
Script is ready for pick up here at front office, left a voice message for pt.

## 2016-09-15 ENCOUNTER — Encounter: Payer: Self-pay | Admitting: Family Medicine

## 2016-09-15 ENCOUNTER — Ambulatory Visit (INDEPENDENT_AMBULATORY_CARE_PROVIDER_SITE_OTHER): Payer: Federal, State, Local not specified - PPO | Admitting: Family Medicine

## 2016-09-15 VITALS — BP 130/89 | HR 56 | Temp 98.5°F | Ht 76.25 in | Wt 194.0 lb

## 2016-09-15 DIAGNOSIS — Z0000 Encounter for general adult medical examination without abnormal findings: Secondary | ICD-10-CM

## 2016-09-15 LAB — LIPID PANEL
CHOL/HDL RATIO: 3
Cholesterol: 159 mg/dL (ref 0–200)
HDL: 58.5 mg/dL (ref >39.00)
LDL CALC: 88 mg/dL (ref 0–99)
NONHDL: 100.03
Triglycerides: 62 mg/dL (ref 0.0–149.0)
VLDL: 12.4 mg/dL (ref 0.0–40.0)

## 2016-09-15 LAB — CBC WITH DIFFERENTIAL/PLATELET
BASOS ABS: 0 10*3/uL (ref 0.0–0.1)
Basophils Relative: 0.7 % (ref 0.0–3.0)
EOS ABS: 0.3 10*3/uL (ref 0.0–0.7)
Eosinophils Relative: 6.5 % — ABNORMAL HIGH (ref 0.0–5.0)
HCT: 44.2 % (ref 39.0–52.0)
Hemoglobin: 15 g/dL (ref 13.0–17.0)
LYMPHS ABS: 1.6 10*3/uL (ref 0.7–4.0)
Lymphocytes Relative: 39.9 % (ref 12.0–46.0)
MCHC: 33.9 g/dL (ref 30.0–36.0)
MCV: 90.1 fl (ref 78.0–100.0)
MONOS PCT: 7.5 % (ref 3.0–12.0)
Monocytes Absolute: 0.3 10*3/uL (ref 0.1–1.0)
NEUTROS PCT: 45.4 % (ref 43.0–77.0)
Neutro Abs: 1.8 10*3/uL (ref 1.4–7.7)
PLATELETS: 186 10*3/uL (ref 150.0–400.0)
RBC: 4.9 Mil/uL (ref 4.22–5.81)
RDW: 14 % (ref 11.5–15.5)
WBC: 4 10*3/uL (ref 4.0–10.5)

## 2016-09-15 LAB — POC URINALSYSI DIPSTICK (AUTOMATED)
Bilirubin, UA: NEGATIVE
Glucose, UA: NEGATIVE
KETONES UA: NEGATIVE
Leukocytes, UA: NEGATIVE
NITRITE UA: NEGATIVE
PH UA: 6 (ref 5.0–8.0)
PROTEIN UA: NEGATIVE
RBC UA: NEGATIVE
Spec Grav, UA: 1.025 (ref 1.010–1.025)
Urobilinogen, UA: 0.2 E.U./dL

## 2016-09-15 LAB — HEPATIC FUNCTION PANEL
ALT: 20 U/L (ref 0–53)
AST: 21 U/L (ref 0–37)
Albumin: 4.1 g/dL (ref 3.5–5.2)
Alkaline Phosphatase: 58 U/L (ref 39–117)
BILIRUBIN TOTAL: 0.6 mg/dL (ref 0.2–1.2)
Bilirubin, Direct: 0.2 mg/dL (ref 0.0–0.3)
Total Protein: 7 g/dL (ref 6.0–8.3)

## 2016-09-15 LAB — TSH: TSH: 1.42 u[IU]/mL (ref 0.35–4.50)

## 2016-09-15 LAB — BASIC METABOLIC PANEL
BUN: 15 mg/dL (ref 6–23)
CO2: 29 mEq/L (ref 19–32)
CREATININE: 1.2 mg/dL (ref 0.40–1.50)
Calcium: 9.4 mg/dL (ref 8.4–10.5)
Chloride: 108 mEq/L (ref 96–112)
GFR: 75.93 mL/min (ref >60.00)
Glucose, Bld: 97 mg/dL (ref 70–99)
Potassium: 4.7 mEq/L (ref 3.5–5.1)
Sodium: 142 mEq/L (ref 135–145)

## 2016-09-15 LAB — PSA: PSA: 3.27 ng/mL (ref 0.10–4.00)

## 2016-09-15 MED ORDER — OXYCODONE-ACETAMINOPHEN 10-325 MG PO TABS: 1.0000 | ORAL_TABLET | Freq: Four times a day (QID) | ORAL | 0 refills | Status: DC | PRN

## 2016-09-15 MED ORDER — TAMSULOSIN HCL 0.4 MG PO CAPS: 0.8000 mg | ORAL_CAPSULE | Freq: Every day | ORAL | 3 refills | Status: DC

## 2016-09-15 NOTE — Progress Notes (Signed)
   Subjective:    Patient ID: Brian Bond, male    DOB: 1940-05-20, 76 y.o.   MRN: [id number]  HPI Here for a well exam. He feels fine but I see he has lost 14 lbs in the past year. He attributes this to not eating lunch when he is on the job since his time is limited. He still works full time.    Review of Systems  Constitutional: Negative.   HENT: Negative.   Eyes: Negative.   Respiratory: Negative.   Cardiovascular: Negative.   Gastrointestinal: Negative.   Genitourinary: Negative.   Musculoskeletal: Negative.   Skin: Negative.   Neurological: Negative.   Psychiatric/Behavioral: Negative.        Objective:   Physical Exam  Constitutional: He is oriented to person, place, and time. He appears well-developed and well-nourished. No distress.  HENT:  Head: Normocephalic and atraumatic.  Right Ear: External ear normal.  Left Ear: External ear normal.  Nose: Nose normal.  Mouth/Throat: Oropharynx is clear and moist. No oropharyngeal exudate.  Eyes: Conjunctivae and EOM are normal. Pupils are equal, round, and reactive to light. Right eye exhibits no discharge. Left eye exhibits no discharge. No scleral icterus.  Neck: Neck supple. No JVD present. No tracheal deviation present. No thyromegaly present.  Cardiovascular: Normal rate, regular rhythm, normal heart sounds and intact distal pulses.  Exam reveals no gallop and no friction rub.   No murmur heard. Pulmonary/Chest: Effort normal and breath sounds normal. No respiratory distress. He has no wheezes. He has no rales. He exhibits no tenderness.  Abdominal: Soft. Bowel sounds are normal. He exhibits no distension and no mass. There is no tenderness. There is no rebound and no guarding.  Genitourinary: Rectum normal, prostate normal and penis normal. Rectal exam shows guaiac negative stool. No penile tenderness.  Musculoskeletal: Normal range of motion. He exhibits no edema or tenderness.  Lymphadenopathy:    He has no  cervical adenopathy.  Neurological: He is alert and oriented to person, place, and time. He has normal reflexes. No cranial nerve deficit. He exhibits normal muscle tone. Coordination normal.  Skin: Skin is warm and dry. No rash noted. He is not diaphoretic. No erythema. No pallor.  Psychiatric: He has a normal mood and affect. His behavior is normal. Judgment and thought content normal.          Assessment & Plan:  Well exam. We discussed diet and exercise. I suggested he try to eat some type of lunch every day. Get fasting labs.  Gershon CraneStephen Fry, MD

## 2016-09-15 NOTE — Patient Instructions (Signed)
WE NOW OFFER   [facility] FAST TRACK!!!  SAME DAY Appointments for ACUTE CARE  Such as: Sprains, Injuries, cuts, abrasions, rashes, muscle pain, joint pain, back pain Colds, flu, sore throats, headache, allergies, cough, fever  Ear pain, sinus and eye infections Abdominal pain, nausea, vomiting, diarrhea, upset stomach Animal/insect bites  3 Easy Ways to Schedule: Walk-In Scheduling Call in scheduling Mychart Sign-up: [email or web address]         

## 2016-10-15 ENCOUNTER — Telehealth: Payer: Self-pay | Admitting: Family Medicine

## 2016-10-15 MED ORDER — OXYCODONE-ACETAMINOPHEN 10-325 MG PO TABS: 1.0000 | ORAL_TABLET | Freq: Four times a day (QID) | ORAL | 0 refills | Status: DC | PRN

## 2016-10-15 NOTE — Telephone Encounter (Signed)
Pt needs new rx percocet °

## 2016-10-15 NOTE — Telephone Encounter (Signed)
done

## 2016-10-16 NOTE — Telephone Encounter (Signed)
Script is ready for pick up here at front office, tried to reach pt and no answer.  

## 2016-10-19 ENCOUNTER — Telehealth: Payer: Self-pay | Admitting: Family Medicine

## 2016-10-19 NOTE — Telephone Encounter (Signed)
Pt picked up script and signed for it. Pt provided ID to pick it up.

## 2016-11-18 ENCOUNTER — Telehealth: Payer: Self-pay | Admitting: Family Medicine

## 2016-11-18 MED ORDER — OXYCODONE-ACETAMINOPHEN 10-325 MG PO TABS: 1.0000 | ORAL_TABLET | Freq: Four times a day (QID) | ORAL | 0 refills | Status: DC | PRN

## 2016-11-18 NOTE — Telephone Encounter (Signed)
Pt request refill  °oxyCODONE-acetaminophen (PERCOCET) 10-325 MG tablet °

## 2016-11-18 NOTE — Telephone Encounter (Signed)
done

## 2016-11-19 NOTE — Telephone Encounter (Signed)
Script is ready for pick up here at front office and I left a message.  

## 2016-12-17 ENCOUNTER — Telehealth: Payer: Self-pay | Admitting: Family Medicine

## 2016-12-17 NOTE — Telephone Encounter (Signed)
Pt request refill  °oxyCODONE-acetaminophen (PERCOCET) 10-325 MG tablet °

## 2016-12-18 MED ORDER — OXYCODONE-ACETAMINOPHEN 10-325 MG PO TABS: 1.0000 | ORAL_TABLET | Freq: Four times a day (QID) | ORAL | 0 refills | Status: DC | PRN

## 2016-12-18 NOTE — Telephone Encounter (Signed)
Done

## 2016-12-18 NOTE — Telephone Encounter (Signed)
Script is ready for pick up here at front office, tried to reach pt no answer, also letter was given.  

## 2017-01-18 ENCOUNTER — Telehealth: Payer: Self-pay | Admitting: Family Medicine

## 2017-01-18 MED ORDER — OXYCODONE-ACETAMINOPHEN 10-325 MG PO TABS: 1.0000 | ORAL_TABLET | Freq: Four times a day (QID) | ORAL | 0 refills | Status: DC | PRN

## 2017-01-18 NOTE — Telephone Encounter (Signed)
Pt request refill  °oxyCODONE-acetaminophen (PERCOCET) 10-325 MG tablet °

## 2017-01-18 NOTE — Telephone Encounter (Signed)
Script is ready for pick up here at front office, letter given and tried to reach pt no answer.

## 2017-01-18 NOTE — Telephone Encounter (Signed)
Done

## 2017-02-13 ENCOUNTER — Other Ambulatory Visit: Payer: Self-pay | Admitting: Family Medicine

## 2017-02-16 ENCOUNTER — Telehealth: Payer: Self-pay | Admitting: Family Medicine

## 2017-02-16 NOTE — Telephone Encounter (Signed)
Copied from CRM [phone]. Topic: Quick Communication - Rx Refill/Question >> Feb 16, 2017  4:59 PM Alexander BergeronBarksdale, Harvey B wrote: Rx request of oxyCODONE-acetaminophen (PERCOCET) 10-325 MG tablet , contact pt if needed

## 2017-02-17 MED ORDER — OXYCODONE-ACETAMINOPHEN 10-325 MG PO TABS: 1.0000 | ORAL_TABLET | Freq: Four times a day (QID) | ORAL | 0 refills | Status: DC | PRN

## 2017-02-17 NOTE — Telephone Encounter (Signed)
Pt is calling back . Pt is aware may take up to 3 business days and office was closed yesterday

## 2017-02-17 NOTE — Telephone Encounter (Signed)
Call in #[phone] with 5 rf 

## 2017-02-17 NOTE — Telephone Encounter (Signed)
Called pt left a VM that Rx is ready for pick up. Rx placed up front.

## 2017-02-17 NOTE — Telephone Encounter (Signed)
Done but he needs a pmv soon  

## 2017-02-17 NOTE — Telephone Encounter (Signed)
Sent to PCP for approval.  

## 2017-02-18 ENCOUNTER — Telehealth: Payer: Self-pay | Admitting: *Deleted

## 2017-02-18 NOTE — Telephone Encounter (Signed)
Caller name: Christiane HaJonathan Relationship to patient: [facility] Can be reached: [phone]  Reason for call: Restoril 30 mg capsules are on backorder, but they do have the 15 mg capsules. They would like to switch to Restoril 15 mg capsules 2 caps PO QHS PRN, #60. Discussed w/ Dr. Clent RidgesFry, verbal okay given to switch capsule strength to equal same dose of medication.

## 2017-02-24 ENCOUNTER — Ambulatory Visit: Payer: Federal, State, Local not specified - PPO | Admitting: Family Medicine

## 2017-02-24 ENCOUNTER — Encounter: Payer: Self-pay | Admitting: Family Medicine

## 2017-02-24 VITALS — BP 118/70 | HR 53 | Temp 98.5°F | Wt 203.8 lb

## 2017-02-24 DIAGNOSIS — F1190 Opioid use, unspecified, uncomplicated: Secondary | ICD-10-CM

## 2017-02-24 DIAGNOSIS — M5137 Other intervertebral disc degeneration, lumbosacral region: Secondary | ICD-10-CM

## 2017-02-24 MED ORDER — OXYCODONE-ACETAMINOPHEN 10-325 MG PO TABS: 1.0000 | ORAL_TABLET | Freq: Four times a day (QID) | ORAL | 0 refills | Status: DC | PRN

## 2017-02-24 NOTE — Progress Notes (Signed)
   Subjective:    Patient ID: Brian Bond, male    DOB: 06-27-1940, 76 y.o.   MRN: [id number]  HPI Here for a pain management visit. He is doing well with no complaints.  Indication for chronic opioid: low back pain  Medication and dose: Percocet 10-325 # pills per month: 120 Last UDS date: 02-24-17 Pain contract signed (Y/N): 02-24-17 Date narcotic database last reviewed (include red flags): 02-24-17     Review of Systems  Constitutional: Negative.   Respiratory: Negative.   Cardiovascular: Negative.   Musculoskeletal: Positive for back pain.       Objective:   Physical Exam  Constitutional: He is oriented to person, place, and time. He appears well-developed and well-nourished.  Cardiovascular: Normal rate, regular rhythm, normal heart sounds and intact distal pulses.  Pulmonary/Chest: Effort normal and breath sounds normal. No respiratory distress. He has no wheezes. He has no rales.  Neurological: He is alert and oriented to person, place, and time.          Assessment & Plan:  Low back pain. Refills were sent in.  Gershon CraneStephen Erie Sica, MD

## 2017-02-28 LAB — PAIN MGMT, PROFILE 8 W/CONF, U
6 ACETYLMORPHINE: NEGATIVE ng/mL (ref <10)
ALPHAHYDROXYALPRAZOLAM: NEGATIVE ng/mL (ref <25)
AMINOCLONAZEPAM: NEGATIVE ng/mL (ref <25)
AMPHETAMINES: NEGATIVE ng/mL (ref <500)
Alcohol Metabolites: NEGATIVE ng/mL (ref <500)
Alphahydroxymidazolam: NEGATIVE ng/mL (ref <50)
Alphahydroxytriazolam: NEGATIVE ng/mL (ref <50)
Benzodiazepines: POSITIVE ng/mL — AB (ref <100)
Buprenorphine, Urine: NEGATIVE ng/mL (ref <5)
COCAINE METABOLITE: NEGATIVE ng/mL (ref <150)
CREATININE: 164.7 mg/dL
Codeine: NEGATIVE ng/mL (ref <50)
HYDROCODONE: NEGATIVE ng/mL (ref <50)
HYDROMORPHONE: NEGATIVE ng/mL (ref <50)
HYDROXYETHYLFLURAZEPAM: NEGATIVE ng/mL (ref <50)
LORAZEPAM: NEGATIVE ng/mL (ref <50)
MDMA: NEGATIVE ng/mL (ref <500)
MORPHINE: NEGATIVE ng/mL (ref <50)
Marijuana Metabolite: NEGATIVE ng/mL (ref <20)
Nordiazepam: NEGATIVE ng/mL (ref <50)
Norhydrocodone: NEGATIVE ng/mL (ref <50)
Noroxycodone: 13385 ng/mL — ABNORMAL HIGH (ref <50)
OXIDANT: NEGATIVE ug/mL (ref <200)
Opiates: NEGATIVE ng/mL (ref <100)
Oxazepam: 577 ng/mL — ABNORMAL HIGH (ref <50)
Oxycodone: 7513 ng/mL — ABNORMAL HIGH (ref <50)
Oxycodone: POSITIVE ng/mL — AB (ref <100)
Oxymorphone: 3424 ng/mL — ABNORMAL HIGH (ref <50)
pH: 6.57 (ref 4.5–9.0)

## 2017-03-05 ENCOUNTER — Telehealth: Payer: Self-pay | Admitting: Family Medicine

## 2017-03-05 DIAGNOSIS — M79672 Pain in left foot: Principal | ICD-10-CM

## 2017-03-05 DIAGNOSIS — M79671 Pain in right foot: Secondary | ICD-10-CM

## 2017-03-05 NOTE — Telephone Encounter (Signed)
Sent to PCP to place referral  

## 2017-03-05 NOTE — Telephone Encounter (Signed)
Copied from CRM [id number]. Topic: Referral - Request >> Mar 05, 2017  3:55 PM Gerrianne ScalePayne, Angela L wrote: Reason for CRM: Patient states that he would like to go to a podiatrist for foot pain that he had talk with Dr Clent RidgesFry about please call pt when referral is done

## 2017-03-08 NOTE — Telephone Encounter (Signed)
The referral was done  

## 2017-03-08 NOTE — Telephone Encounter (Signed)
Called pt and left a VM that referral was placed.  

## 2017-03-23 ENCOUNTER — Telehealth: Payer: Self-pay | Admitting: Family Medicine

## 2017-03-23 NOTE — Telephone Encounter (Signed)
Copied from CRM [id number] [id number]. Topic: Quick Communication - See Telephone Encounter >> Mar 23, 2017 10:54 AM Rudi CocoLathan, Sandra Brents M, NT wrote: CRM for notification. See Telephone encounter for:   03/23/17.pt. Calling to get refill on med.(Percocet)  But insurance will not cover med. Pt. Also needs the amount of pills to increase to 120 instead of 90 [phone] (pt. Left this number that the insurance said to call back) pt. Is completely out of med.

## 2017-03-23 NOTE — Telephone Encounter (Signed)
Called number below and initiated PA for oxycodone. PA approved through 09/19/17 for oxycodone-acetaminophen 10-325 mg.  [facility], #120, 3 prescriptions sent.  Called patient and left message notifying him of above (per DPR) and instructions to return call if questions.

## 2017-03-23 NOTE — Telephone Encounter (Signed)
Sent to [facility] for assistance

## 2017-03-23 NOTE — Telephone Encounter (Signed)
Last OV 02/24/2017  Rx was last refilled 02/24/2017 disp 120 with no refills

## 2017-04-19 ENCOUNTER — Telehealth: Payer: Self-pay | Admitting: Family Medicine

## 2017-04-19 NOTE — Telephone Encounter (Signed)
Pt should have refills at pharmacy

## 2017-04-19 NOTE — Telephone Encounter (Signed)
Oxycodone-acetaminophen 10-325 mg refill request.   Controlled substance Last OV 02/24/17. [facility] [address]

## 2017-04-19 NOTE — Telephone Encounter (Signed)
Copied from CRM [id number]. Topic: Quick Communication - Rx Refill/Question >> Apr 19, 2017  1:24 PM Landry MellowFoltz, Melissa J wrote: Medication: oxyCODONE-acetaminophen (PERCOCET) 10-325 MG tablet   Has the patient contacted their pharmacy? No.   (Agent: If no, request that the patient contact the pharmacy for the refill.)   Preferred Pharmacy (with phone number or street name): [facility] [address]  Pt is out of meds    Agent: Please be advised that RX refills may take up to 3 business days. We ask that you follow-up with your pharmacy.

## 2017-04-21 NOTE — Telephone Encounter (Signed)
Called pharmacy and pt does have refills

## 2017-04-21 NOTE — Telephone Encounter (Signed)
Called pt and left a VM that he does have refills at his pharmacy

## 2017-05-19 ENCOUNTER — Ambulatory Visit: Payer: Federal, State, Local not specified - PPO | Admitting: Family Medicine

## 2017-05-19 ENCOUNTER — Encounter: Payer: Self-pay | Admitting: Family Medicine

## 2017-05-19 VITALS — BP 102/68 | HR 54 | Temp 98.4°F | Wt 204.0 lb

## 2017-05-19 DIAGNOSIS — F1190 Opioid use, unspecified, uncomplicated: Secondary | ICD-10-CM

## 2017-05-19 DIAGNOSIS — M5137 Other intervertebral disc degeneration, lumbosacral region: Secondary | ICD-10-CM | POA: Diagnosis not present

## 2017-05-19 MED ORDER — OXYCODONE-ACETAMINOPHEN 10-325 MG PO TABS: 1.0000 | ORAL_TABLET | Freq: Four times a day (QID) | ORAL | 0 refills | Status: DC | PRN

## 2017-05-19 NOTE — Progress Notes (Signed)
   Subjective:    Patient ID: Min E Corradi, male [name]   DOB: 08/31/1940, 77 y.o.   MRN: [id number]  HPI Here for pain management. He is doing well.  Indication for chronic opioid: low back pain  Medication and dose: Percocet 10-325  # pills per month: 120 Last UDS date: 02-24-17 Pain contract signed (Y/N): 05-19-17 Date narcotic database last reviewed (include red flags): 05-19-17    Review of Systems  Constitutional: Negative.   Respiratory: Negative.   Cardiovascular: Negative.   Musculoskeletal: Positive for back pain.  Neurological: Negative.        Objective:   Physical Exam  Constitutional: He is oriented to person, place, and time. He appears well-developed and well-nourished.  Cardiovascular: Normal rate, regular rhythm, normal heart sounds and intact distal pulses.  Pulmonary/Chest: Effort normal and breath sounds normal. No respiratory distress. He has no wheezes. He has no rales.  Neurological: He is alert and oriented to person, place, and time.          Assessment & Plan:  Pain management. Meds were refilled.  Gershon CraneStephen Tessi Eustache, MD

## 2017-08-18 ENCOUNTER — Other Ambulatory Visit: Payer: Self-pay | Admitting: Family Medicine

## 2017-08-18 NOTE — Telephone Encounter (Signed)
Last OV 05/19/2017   Rx was last refilled for the 30 MG dose  30 MG last refilled 02/18/2017 disp 30 with 5 refills  Sent to PCP to advise

## 2017-08-18 NOTE — Telephone Encounter (Signed)
Call in 30 mg dose, #30 with 5 rf. Cancel the 15 mg dose

## 2017-09-14 ENCOUNTER — Ambulatory Visit: Payer: Federal, State, Local not specified - PPO | Admitting: Family Medicine

## 2017-09-14 ENCOUNTER — Encounter: Payer: Self-pay | Admitting: Family Medicine

## 2017-09-14 VITALS — BP 98/62 | HR 83 | Temp 98.8°F | Ht 76.25 in | Wt 193.8 lb

## 2017-09-14 DIAGNOSIS — F1190 Opioid use, unspecified, uncomplicated: Secondary | ICD-10-CM | POA: Diagnosis not present

## 2017-09-14 DIAGNOSIS — M5137 Other intervertebral disc degeneration, lumbosacral region: Secondary | ICD-10-CM | POA: Diagnosis not present

## 2017-09-14 MED ORDER — OXYCODONE-ACETAMINOPHEN 10-325 MG PO TABS: 1.0000 | ORAL_TABLET | Freq: Four times a day (QID) | ORAL | 0 refills | Status: DC | PRN

## 2017-09-14 MED ORDER — OXYCODONE-ACETAMINOPHEN 10-325 MG PO TABS
1.0000 | ORAL_TABLET | Freq: Four times a day (QID) | ORAL | 0 refills | Status: AC | PRN
Start: 2017-11-18 — End: 2017-12-18

## 2017-09-14 MED ORDER — TAMSULOSIN HCL 0.4 MG PO CAPS: 0.8000 mg | ORAL_CAPSULE | Freq: Every day | ORAL | 3 refills | Status: DC

## 2017-09-14 NOTE — Progress Notes (Signed)
   Subjective:    Patient ID: Arley PhenixRonald E Aycock, male    DOB: [birth year], 77 y.o.   MRN: [id number]  HPI Here for pain management. He has been doing well.  Indication for chronic opioid: low back pain Medication and dose: Percocet 10-325  # pills per month: 120 Last UDS date: 02-24-17 Opioid Treatment Agreement signed (Y/N): 05-19-17 Opioid Treatment Agreement last reviewed with patient:  09-14-17 NCCSRS reviewed this encounter (include red flags):  09-14-17   Review of Systems  Constitutional: Negative.   Respiratory: Negative.   Cardiovascular: Negative.   Musculoskeletal: Positive for back pain.  Neurological: Negative.        Objective:   Physical Exam  Constitutional: He is oriented to person, place, and time. He appears well-developed and well-nourished.  Cardiovascular: Normal rate, regular rhythm, normal heart sounds and intact distal pulses.  Pulmonary/Chest: Effort normal and breath sounds normal.  Neurological: He is alert and oriented to person, place, and time.          Assessment & Plan:  Pain management, meds are refilled.  Gershon CraneStephen Jordann Grime, MD

## 2017-09-19 ENCOUNTER — Other Ambulatory Visit: Payer: Self-pay | Admitting: Family Medicine

## 2017-09-20 NOTE — Telephone Encounter (Signed)
Last OV 09/14/2017   Last refill 08/19/2016 disp 30 with 5 refills

## 2017-09-21 ENCOUNTER — Encounter: Payer: Self-pay | Admitting: Family Medicine

## 2017-09-21 ENCOUNTER — Ambulatory Visit (INDEPENDENT_AMBULATORY_CARE_PROVIDER_SITE_OTHER): Payer: Federal, State, Local not specified - PPO | Admitting: Family Medicine

## 2017-09-21 VITALS — BP 110/72 | HR 100 | Temp 98.7°F | Ht 77.0 in | Wt 191.6 lb

## 2017-09-21 DIAGNOSIS — Z0000 Encounter for general adult medical examination without abnormal findings: Secondary | ICD-10-CM

## 2017-09-21 DIAGNOSIS — Z125 Encounter for screening for malignant neoplasm of prostate: Secondary | ICD-10-CM

## 2017-09-21 LAB — BASIC METABOLIC PANEL
BUN: 22 mg/dL (ref 6–23)
CALCIUM: 8.9 mg/dL (ref 8.4–10.5)
CO2: 28 mEq/L (ref 19–32)
CREATININE: 1.3 mg/dL (ref 0.40–1.50)
Chloride: 108 mEq/L (ref 96–112)
GFR: 69.04 mL/min (ref >60.00)
GLUCOSE: 91 mg/dL (ref 70–99)
POTASSIUM: 4.6 meq/L (ref 3.5–5.1)
SODIUM: 142 meq/L (ref 135–145)

## 2017-09-21 LAB — CBC WITH DIFFERENTIAL/PLATELET
BASOS ABS: 0 10*3/uL (ref 0.0–0.1)
Basophils Relative: 0.6 % (ref 0.0–3.0)
Eosinophils Absolute: 0.1 10*3/uL (ref 0.0–0.7)
Eosinophils Relative: 3.3 % (ref 0.0–5.0)
HCT: 43.5 % (ref 39.0–52.0)
Hemoglobin: 14.7 g/dL (ref 13.0–17.0)
LYMPHS ABS: 1.4 10*3/uL (ref 0.7–4.0)
Lymphocytes Relative: 35.2 % (ref 12.0–46.0)
MCHC: 33.9 g/dL (ref 30.0–36.0)
MCV: 90.7 fl (ref 78.0–100.0)
MONO ABS: 0.3 10*3/uL (ref 0.1–1.0)
MONOS PCT: 7.3 % (ref 3.0–12.0)
Neutro Abs: 2.1 10*3/uL (ref 1.4–7.7)
Neutrophils Relative %: 53.6 % (ref 43.0–77.0)
Platelets: 180 10*3/uL (ref 150.0–400.0)
RBC: 4.79 Mil/uL (ref 4.22–5.81)
RDW: 13.4 % (ref 11.5–15.5)
WBC: 3.9 10*3/uL — AB (ref 4.0–10.5)

## 2017-09-21 LAB — LIPID PANEL
CHOL/HDL RATIO: 3
CHOLESTEROL: 159 mg/dL (ref 0–200)
HDL: 59.7 mg/dL (ref >39.00)
LDL CALC: 89 mg/dL (ref 0–99)
NONHDL: 99.1
Triglycerides: 49 mg/dL (ref 0.0–149.0)
VLDL: 9.8 mg/dL (ref 0.0–40.0)

## 2017-09-21 LAB — HEPATIC FUNCTION PANEL
ALT: 25 U/L (ref 0–53)
AST: 23 U/L (ref 0–37)
Albumin: 4 g/dL (ref 3.5–5.2)
Alkaline Phosphatase: 55 U/L (ref 39–117)
Bilirubin, Direct: 0.1 mg/dL (ref 0.0–0.3)
Total Bilirubin: 0.5 mg/dL (ref 0.2–1.2)
Total Protein: 6.9 g/dL (ref 6.0–8.3)

## 2017-09-21 LAB — POC URINALSYSI DIPSTICK (AUTOMATED)
Bilirubin, UA: NEGATIVE
Blood, UA: NEGATIVE
Glucose, UA: NEGATIVE
KETONES UA: NEGATIVE
Leukocytes, UA: NEGATIVE
Nitrite, UA: NEGATIVE
PH UA: 6 (ref 5.0–8.0)
PROTEIN UA: NEGATIVE
Spec Grav, UA: 1.025 (ref 1.010–1.025)
UROBILINOGEN UA: 0.2 U/dL

## 2017-09-21 LAB — PSA: PSA: 3.74 ng/mL (ref 0.10–4.00)

## 2017-09-21 LAB — VITAMIN B12: VITAMIN B 12: 133 pg/mL — AB (ref 211–911)

## 2017-09-21 LAB — TSH: TSH: 1.35 u[IU]/mL (ref 0.35–4.50)

## 2017-09-21 MED ORDER — TADALAFIL 20 MG PO TABS: 20.0000 mg | ORAL_TABLET | Freq: Every day | ORAL | 11 refills | Status: DC | PRN

## 2017-09-21 NOTE — Progress Notes (Signed)
   Subjective:    Patient ID: Brian Bond, male    DOB: 1940/10/10, 77 y.o.   MRN: [id number]  HPI Here for a well exam. He is doing well but he mentions some mild intermittent numbness in both lower legs. No pain or weakness.    Review of Systems  Constitutional: Negative.   HENT: Negative.   Eyes: Negative.   Respiratory: Negative.   Cardiovascular: Negative.   Gastrointestinal: Negative.   Genitourinary: Negative.   Musculoskeletal: Negative.   Skin: Negative.   Neurological: Positive for numbness.  Psychiatric/Behavioral: Negative.        Objective:   Physical Exam  Constitutional: He is oriented to person, place, and time. He appears well-developed and well-nourished. No distress.  HENT:  Head: Normocephalic and atraumatic.  Right Ear: External ear normal.  Left Ear: External ear normal.  Nose: Nose normal.  Mouth/Throat: Oropharynx is clear and moist. No oropharyngeal exudate.  Eyes: Pupils are equal, round, and reactive to light. Conjunctivae and EOM are normal. Right eye exhibits no discharge. Left eye exhibits no discharge. No scleral icterus.  Neck: Neck supple. No JVD present. No tracheal deviation present. No thyromegaly present.  Cardiovascular: Normal rate, regular rhythm, normal heart sounds and intact distal pulses. Exam reveals no gallop and no friction rub.  No murmur heard. Pulmonary/Chest: Effort normal and breath sounds normal. No respiratory distress. He has no wheezes. He has no rales. He exhibits no tenderness.  Abdominal: Soft. Bowel sounds are normal. He exhibits no distension and no mass. There is no tenderness. There is no rebound and no guarding.  Genitourinary: Rectum normal, prostate normal and penis normal. Rectal exam shows guaiac negative stool. No penile tenderness.  Musculoskeletal: Normal range of motion. He exhibits no edema or tenderness.  Lymphadenopathy:    He has no cervical adenopathy.  Neurological: He is alert and oriented to  person, place, and time. He has normal reflexes. He displays normal reflexes. No cranial nerve deficit. He exhibits normal muscle tone. Coordination normal.  Skin: Skin is warm and dry. No rash noted. He is not diaphoretic. No erythema. No pallor.  Psychiatric: He has a normal mood and affect. His behavior is normal. Judgment and thought content normal.          Assessment & Plan:  Well exam. We discussed diet and exercise. Get fasting labs. Because of the leg numbness we will check a B12 level as well.  Gershon CraneStephen Emmett Arntz, MD

## 2017-09-23 ENCOUNTER — Ambulatory Visit (INDEPENDENT_AMBULATORY_CARE_PROVIDER_SITE_OTHER): Payer: Federal, State, Local not specified - PPO

## 2017-09-23 ENCOUNTER — Telehealth: Payer: Self-pay | Admitting: Family Medicine

## 2017-09-23 DIAGNOSIS — E538 Deficiency of other specified B group vitamins: Secondary | ICD-10-CM | POA: Diagnosis not present

## 2017-09-23 MED ORDER — CYANOCOBALAMIN 1000 MCG/ML IJ SOLN
1000.0000 ug | Freq: Once | INTRAMUSCULAR | Status: AC
  Administered 2017-09-23: 1000 ug via INTRAMUSCULAR

## 2017-09-23 NOTE — Telephone Encounter (Signed)
Patient is having some confusion on understanding his lab results.  He is requesting a call to discuss labs.  [phone]

## 2017-09-23 NOTE — Progress Notes (Addendum)
Per orders of Shirline Freesory Nafziger NP in Dr. Claris CheFry's abscess, injection of B12 1000 mcg/ml given by Solon AugustaLindsay  Jennessa Trigo. Patient tolerated injection well.

## 2017-09-24 NOTE — Telephone Encounter (Signed)
Called patient to discuss his labs, no answer. Left message for him to try to call us back.

## 2017-09-24 NOTE — Telephone Encounter (Signed)
Called pt and left a VM to call back. CRM created and sent to PCP pool. 

## 2017-09-27 NOTE — Telephone Encounter (Signed)
Called pt and left a VM to call back. CRM created and sent to PEC pool. 

## 2017-09-27 NOTE — Telephone Encounter (Signed)
Attempted to call patient regarding his labs, no answer. Left message for him to call back.

## 2017-09-28 NOTE — Telephone Encounter (Signed)
Called pt and left a VM to call back.  

## 2017-09-30 ENCOUNTER — Ambulatory Visit (INDEPENDENT_AMBULATORY_CARE_PROVIDER_SITE_OTHER): Payer: Federal, State, Local not specified - PPO | Admitting: Family Medicine

## 2017-09-30 DIAGNOSIS — E538 Deficiency of other specified B group vitamins: Secondary | ICD-10-CM

## 2017-09-30 MED ORDER — CYANOCOBALAMIN 1000 MCG/ML IJ SOLN
1000.0000 ug | Freq: Once | INTRAMUSCULAR | Status: AC
  Administered 2017-09-30: 1000 ug via INTRAMUSCULAR

## 2017-09-30 NOTE — Progress Notes (Signed)
Per orders of Dr. Fry injection of Vitamin B 12 given by NIMMONS, SYLVIA ANN. Patient tolerated injection well. 

## 2017-10-01 NOTE — Telephone Encounter (Signed)
Calle dpt and lefta detailed VM to call back will close out message and will wait for pt to return our call.

## 2017-10-07 ENCOUNTER — Ambulatory Visit (INDEPENDENT_AMBULATORY_CARE_PROVIDER_SITE_OTHER): Payer: Federal, State, Local not specified - PPO | Admitting: Family Medicine

## 2017-10-07 DIAGNOSIS — E538 Deficiency of other specified B group vitamins: Secondary | ICD-10-CM

## 2017-10-07 MED ORDER — CYANOCOBALAMIN 1000 MCG/ML IJ SOLN
1000.0000 ug | Freq: Once | INTRAMUSCULAR | Status: AC
  Administered 2017-10-07: 1000 ug via INTRAMUSCULAR

## 2017-10-07 NOTE — Progress Notes (Signed)
Per orders of Dr. Fry, injection of Vitamin B 12 given by Merida Alcantar ANN. Patient tolerated injection well. 

## 2017-10-12 ENCOUNTER — Other Ambulatory Visit: Payer: Self-pay | Admitting: Family Medicine

## 2017-10-14 ENCOUNTER — Ambulatory Visit (INDEPENDENT_AMBULATORY_CARE_PROVIDER_SITE_OTHER): Payer: Federal, State, Local not specified - PPO | Admitting: Family Medicine

## 2017-10-14 DIAGNOSIS — E538 Deficiency of other specified B group vitamins: Secondary | ICD-10-CM

## 2017-10-14 MED ORDER — CYANOCOBALAMIN 1000 MCG/ML IJ SOLN
1000.0000 ug | Freq: Once | INTRAMUSCULAR | Status: AC
  Administered 2017-10-14: 1000 ug via INTRAMUSCULAR

## 2017-10-14 NOTE — Progress Notes (Signed)
Per orders of Dr. Banks, injection of Vitamin B 12 given by Maloree Uplinger ANN. Patient tolerated injection well.  Dr. Fry is out of the office this afternoon. 

## 2017-10-20 ENCOUNTER — Telehealth: Payer: Self-pay | Admitting: Family Medicine

## 2017-10-20 NOTE — Telephone Encounter (Signed)
Percocet 10-325 mg refill  He is out of medication.   Insurance telling him it's too soon to refill.    He has an appt 8/15 but looks like it is with the nurse not Dr. Clent RidgesFry. Last Refill:11/18/17 # 120 Last OV: 09/21/17     PCP: Clent RidgesFry Pharmacy:[facility] [phone]

## 2017-10-20 NOTE — Telephone Encounter (Signed)
Copied from CRM [id number]. Topic: Quick Communication - Rx Refill/Question >> Oct 20, 2017  1:17 PM Angela NevinWilliams, Candice N wrote: Medication: oxyCODONE-acetaminophen (PERCOCET) 10-325 MG tablet   Pt called stating that his pharmacy advised him to contact pcp about this medication. Pt states that his insurance will not cover a refill stating that is is too soon. Pt said he is out of this medication. Pt has an appointment tomorrow with Dr. Clent RidgesFry but stated he needed this asap. Please advise.   Preferred Pharmacy (with phone number or street name): [id number] - [place], [place] - [address] [phone] (Phone) [phone] (Fax)    Agent: Please be advised that RX refills may take up to 3 business days. We ask that you follow-up with your pharmacy.

## 2017-10-21 ENCOUNTER — Ambulatory Visit (INDEPENDENT_AMBULATORY_CARE_PROVIDER_SITE_OTHER): Payer: Federal, State, Local not specified - PPO | Admitting: Family Medicine

## 2017-10-21 DIAGNOSIS — E538 Deficiency of other specified B group vitamins: Secondary | ICD-10-CM | POA: Diagnosis not present

## 2017-10-21 MED ORDER — CYANOCOBALAMIN 1000 MCG/ML IJ SOLN
1000.0000 ug | Freq: Once | INTRAMUSCULAR | Status: AC
  Administered 2017-10-21: 1000 ug via INTRAMUSCULAR

## 2017-10-21 NOTE — Telephone Encounter (Signed)
PA needed for percocet

## 2017-10-21 NOTE — Telephone Encounter (Signed)
Pt will need a Prior authorization done to get his medication approved for his refills. Insurance company will only allow 270 pills per 75 days pt get 120 pills once monthly.   Sent to Marvia PicklesJo Ann to do PA   Called pt and left a VM to call back to explain the situation CRM created and [facility]

## 2017-10-21 NOTE — Progress Notes (Signed)
Per orders of Dr. Salomon FickBanks, injection of Vitamin B 12 given by Aniceto BossNIMMONS, Jamy Cleckler ANN. Patient tolerated injection well.  Dr. Clent RidgesFry is out of the office this afternoon.

## 2017-10-22 NOTE — Telephone Encounter (Signed)
Prior auth for Oxycodone-acteaminophen 10/325mg  sent to [email or web address]-key [id number].

## 2017-10-25 NOTE — Telephone Encounter (Signed)
[facility] the request was not approved at this time and this was forwarded to Dr Claris CheFry's asst.

## 2017-10-26 NOTE — Telephone Encounter (Signed)
He already has refills available until 12-15-17

## 2017-10-26 NOTE — Telephone Encounter (Signed)
Placed in PCP green folder to review.

## 2017-10-28 ENCOUNTER — Ambulatory Visit (INDEPENDENT_AMBULATORY_CARE_PROVIDER_SITE_OTHER): Payer: Federal, State, Local not specified - PPO

## 2017-10-28 DIAGNOSIS — E538 Deficiency of other specified B group vitamins: Secondary | ICD-10-CM

## 2017-10-28 MED ORDER — CYANOCOBALAMIN 1000 MCG/ML IJ SOLN
1000.0000 ug | Freq: Once | INTRAMUSCULAR | Status: AC
  Administered 2017-10-28: 1000 ug via INTRAMUSCULAR

## 2017-10-28 NOTE — Telephone Encounter (Signed)
Called pt and left a VM to call back. CRM created and sent to PEC pool. 

## 2017-10-28 NOTE — Progress Notes (Signed)
Per orders of Shirline Freesory Nafziger in Dr. Claris CheFry's abscess, injection of B12 10200mcg/ml given by Solon AugustaLindsay  Glorene Leitzke. Patient tolerated injection well.

## 2017-10-29 NOTE — Telephone Encounter (Signed)
Spoke with pharmacy and they advised rx has been approved and is ready for pick up.  LM on pt's vm rx is ready.

## 2017-10-29 NOTE — Telephone Encounter (Signed)
Pt called to check status of his rx refill.  Pharmacy is closed at this time. WCB later this morning.

## 2017-11-04 ENCOUNTER — Ambulatory Visit (INDEPENDENT_AMBULATORY_CARE_PROVIDER_SITE_OTHER): Payer: Federal, State, Local not specified - PPO | Admitting: Family Medicine

## 2017-11-04 DIAGNOSIS — E538 Deficiency of other specified B group vitamins: Secondary | ICD-10-CM | POA: Diagnosis not present

## 2017-11-04 MED ORDER — CYANOCOBALAMIN 1000 MCG/ML IJ SOLN
1000.0000 ug | Freq: Once | INTRAMUSCULAR | Status: AC
  Administered 2017-11-04: 1000 ug via INTRAMUSCULAR

## 2017-11-04 NOTE — Progress Notes (Signed)
Per orders of Dr. Fry, injection of Vitamin B 12 given by Ilay Capshaw ANN. Patient tolerated injection well. 

## 2017-11-11 ENCOUNTER — Ambulatory Visit (INDEPENDENT_AMBULATORY_CARE_PROVIDER_SITE_OTHER): Payer: Federal, State, Local not specified - PPO | Admitting: Family Medicine

## 2017-11-11 DIAGNOSIS — E538 Deficiency of other specified B group vitamins: Secondary | ICD-10-CM | POA: Diagnosis not present

## 2017-11-11 MED ORDER — CYANOCOBALAMIN 1000 MCG/ML IJ SOLN
1000.0000 ug | Freq: Once | INTRAMUSCULAR | Status: AC
  Administered 2017-11-11: 1000 ug via INTRAMUSCULAR

## 2017-11-11 NOTE — Progress Notes (Signed)
Per orders of Dr. Fry, injection of Vitamin B 12 given by NIMMONS, SYLVIA ANN. Patient tolerated injection well. 

## 2017-11-15 ENCOUNTER — Encounter: Payer: Self-pay | Admitting: Podiatry

## 2017-11-15 ENCOUNTER — Other Ambulatory Visit: Payer: Self-pay | Admitting: Podiatry

## 2017-11-15 ENCOUNTER — Ambulatory Visit (INDEPENDENT_AMBULATORY_CARE_PROVIDER_SITE_OTHER): Payer: Federal, State, Local not specified - PPO

## 2017-11-15 ENCOUNTER — Ambulatory Visit: Payer: Federal, State, Local not specified - PPO | Admitting: Podiatry

## 2017-11-15 ENCOUNTER — Ambulatory Visit: Payer: Federal, State, Local not specified - PPO

## 2017-11-15 VITALS — BP 125/67 | HR 48 | Resp 16

## 2017-11-15 DIAGNOSIS — M216X9 Other acquired deformities of unspecified foot: Secondary | ICD-10-CM | POA: Diagnosis not present

## 2017-11-15 DIAGNOSIS — M2041 Other hammer toe(s) (acquired), right foot: Secondary | ICD-10-CM

## 2017-11-15 DIAGNOSIS — M79672 Pain in left foot: Principal | ICD-10-CM

## 2017-11-15 DIAGNOSIS — M2042 Other hammer toe(s) (acquired), left foot: Principal | ICD-10-CM

## 2017-11-15 DIAGNOSIS — L84 Corns and callosities: Secondary | ICD-10-CM

## 2017-11-15 DIAGNOSIS — M79671 Pain in right foot: Secondary | ICD-10-CM

## 2017-11-15 NOTE — Progress Notes (Signed)
   Subjective:    Patient ID: Brian Bond, male    DOB: August 16, 1940, 77 y.o.   MRN: [id number]  HPI    Review of Systems  All other systems reviewed and are negative.      Objective:   Physical Exam        Assessment & Plan:

## 2017-11-16 NOTE — Progress Notes (Signed)
Subjective:   Patient ID: Brian Bond, male   DOB: 77 y.o.   MRN: [id number]   HPI Patient presents with severe discomfort in the plantar aspect of both feet with the right being worse than the left and states he has had the area trimmed with minimal relief of symptoms and he is interested in a more definitive solution for this problem.  Patient does not smoke and likes to be active   Review of Systems  All other systems reviewed and are negative.       Objective:  Physical Exam  Constitutional: He appears well-developed and well-nourished.  Cardiovascular: Intact distal pulses.  Pulmonary/Chest: Effort normal.  Musculoskeletal: Normal range of motion.  Neurological: He is alert.  Skin: Skin is warm.  Nursing note and vitals reviewed.   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  Extremely prominent metatarsal heads bilaterally with pressure against the bones themselves.  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  I did discuss possibility for metatarsal osteotomy digital fusion procedures but there is no guarantee this will resolve the symptoms but would be the best chance he has we also discussed deep pocketed orthotics for the future.  Today I debrided the lesions and applied padding and he will review his different options and we will see him back in 3 weeks  X-ray indicates lesions underneath the second metatarsal right third metatarsal left with digital contracture bilateral

## 2017-11-22 ENCOUNTER — Ambulatory Visit (INDEPENDENT_AMBULATORY_CARE_PROVIDER_SITE_OTHER): Payer: Federal, State, Local not specified - PPO | Admitting: *Deleted

## 2017-11-22 DIAGNOSIS — E538 Deficiency of other specified B group vitamins: Secondary | ICD-10-CM

## 2017-11-22 MED ORDER — CYANOCOBALAMIN 1000 MCG/ML IJ SOLN
1000.0000 ug | Freq: Once | INTRAMUSCULAR | Status: AC
  Administered 2017-11-22: 1000 ug via INTRAMUSCULAR

## 2017-11-22 NOTE — Progress Notes (Signed)
Per orders of Dr. Fry, injection of Cyanocobalamin 1000mg given by Funderburk, Jo A. Patient tolerated injection well. 

## 2017-11-23 ENCOUNTER — Ambulatory Visit: Payer: Federal, State, Local not specified - PPO

## 2017-12-06 ENCOUNTER — Ambulatory Visit (INDEPENDENT_AMBULATORY_CARE_PROVIDER_SITE_OTHER): Payer: Federal, State, Local not specified - PPO | Admitting: *Deleted

## 2017-12-06 DIAGNOSIS — E538 Deficiency of other specified B group vitamins: Secondary | ICD-10-CM

## 2017-12-06 MED ORDER — CYANOCOBALAMIN 1000 MCG/ML IJ SOLN
1000.0000 ug | Freq: Once | INTRAMUSCULAR | Status: AC
  Administered 2017-12-06: 1000 ug via INTRAMUSCULAR

## 2017-12-06 NOTE — Progress Notes (Signed)
Per orders of Dr. Fry, injection of B12 given by Deyonna Fitzsimmons. Patient tolerated injection well. 

## 2017-12-13 ENCOUNTER — Ambulatory Visit: Payer: Federal, State, Local not specified - PPO | Admitting: Podiatry

## 2017-12-14 ENCOUNTER — Ambulatory Visit (INDEPENDENT_AMBULATORY_CARE_PROVIDER_SITE_OTHER): Payer: Federal, State, Local not specified - PPO

## 2017-12-14 DIAGNOSIS — E538 Deficiency of other specified B group vitamins: Secondary | ICD-10-CM

## 2017-12-14 MED ORDER — CYANOCOBALAMIN 1000 MCG/ML IJ SOLN
1000.0000 ug | Freq: Once | INTRAMUSCULAR | Status: AC
  Administered 2017-12-14: 1000 ug via INTRAMUSCULAR

## 2017-12-14 NOTE — Progress Notes (Signed)
Per orders of Dr. Fry, injection of B12 given by Arlenne Kimbley. Patient tolerated injection well.  

## 2017-12-28 ENCOUNTER — Ambulatory Visit: Payer: Federal, State, Local not specified - PPO | Admitting: Family Medicine

## 2017-12-28 ENCOUNTER — Encounter: Payer: Self-pay | Admitting: Family Medicine

## 2017-12-28 VITALS — BP 110/66 | HR 68 | Temp 98.6°F | Wt 195.3 lb

## 2017-12-28 DIAGNOSIS — G8929 Other chronic pain: Secondary | ICD-10-CM | POA: Diagnosis not present

## 2017-12-28 DIAGNOSIS — F1190 Opioid use, unspecified, uncomplicated: Secondary | ICD-10-CM

## 2017-12-28 DIAGNOSIS — M545 Low back pain: Secondary | ICD-10-CM

## 2017-12-28 MED ORDER — OXYCODONE-ACETAMINOPHEN 10-325 MG PO TABS: 1.0000 | ORAL_TABLET | Freq: Four times a day (QID) | ORAL | 0 refills | Status: DC | PRN

## 2017-12-28 NOTE — Progress Notes (Signed)
   Subjective:    Patient ID: Brian Bond, male    DOB: 06-21-1940, 77 y.o.   MRN: [id number]  HPI Here for pain management. He is doing well.  Indication for chronic opioid: low back pain Medication and dose: Percocet 10-325  # pills per month: 120 Last UDS date: 02-24-17 Opioid Treatment Agreement signed (Y/N): 05-19-17 Opioid Treatment Agreement last reviewed with patient:  12-28-17 NCCSRS reviewed this encounter (include red flags):  12-28-17    Review of Systems  Constitutional: Negative.   Respiratory: Negative.   Cardiovascular: Negative.   Musculoskeletal: Positive for back pain.  Neurological: Negative.        Objective:   Physical Exam  Constitutional: He is oriented to person, place, and time. He appears well-developed and well-nourished.  Cardiovascular: Normal rate, regular rhythm, normal heart sounds and intact distal pulses.  Pulmonary/Chest: Effort normal and breath sounds normal.  Neurological: He is alert and oriented to person, place, and time.          Assessment & Plan:  Pain management. meds were refilled.  Gershon Crane, MD

## 2017-12-29 ENCOUNTER — Telehealth: Payer: Self-pay | Admitting: Family Medicine

## 2017-12-29 NOTE — Telephone Encounter (Signed)
Looks like a med refill was sent yesterday, but states unable to fill until 02/27/18. Please verify if this is correct.

## 2017-12-29 NOTE — Telephone Encounter (Signed)
Copied from CRM [id number]. Topic: Quick Communication - Rx Refill/Question >> Dec 29, 2017 12:05 PM Arlyss Gandy, NT wrote: Medication: oxyCODONE-acetaminophen (PERCOCET) 10-325 MG tablet   Has the patient contacted their pharmacy? Yes.   (Agent: If no, request that the patient contact the pharmacy for the refill.) (Agent: If yes, when and what did the pharmacy advise?)  Preferred Pharmacy (with phone number or street name): [facility] #[id number] - [place], [place] - [address] [phone] (Phone) [phone] (Fax)    Agent: Please be advised that RX refills may take up to 3 business days. We ask that you follow-up with your pharmacy.

## 2017-12-29 NOTE — Telephone Encounter (Signed)
Medication was refilled on 12/28/17 #120 x 0refills but it looks like the start date might have been adjusted in error to 02/27/18. The pharmacy is unable to refill until this is fixed or verified. Please advise Dr Clent Ridges if you were wanting the patient to start Rx now or this was an Rx to be put on hold until Dec 2019? Thanks.

## 2017-12-29 NOTE — Telephone Encounter (Signed)
I sent in 3 different prescriptions as I always do. One starts on 12-28-17, one on 01-28-18, and the past one on 02-27-18. Only the last one shows up in Epic but the pharmacy has all 3 of them

## 2017-12-29 NOTE — Telephone Encounter (Signed)
Called and spoke with the pharmacy and they stated that the insurance company will only allow the pt a certain quantity and he is over this limit.  Will need PA done for the percocet.   Call [phone] please.

## 2017-12-31 NOTE — Telephone Encounter (Signed)
Prior auth for Oxycodone-acetaminophen 10/325 sent to [email or web address]-key [id number].

## 2018-02-18 ENCOUNTER — Other Ambulatory Visit: Payer: Self-pay | Admitting: Family Medicine

## 2018-02-18 NOTE — Telephone Encounter (Signed)
Copied from CRM [id number]. Topic: Quick Communication - Rx Refill/Question >> Feb 18, 2018  9:22 AM Lynne LoganHudson, Caryn D wrote: Medication: temazepam (RESTORIL) 30 MG capsule  Has the patient contacted their pharmacy? Yes.   (Agent: If no, request that the patient contact the pharmacy for the refill.) (Agent: If yes, when and what did the pharmacy advise?)  Preferred Pharmacy (with phone number or street name): [facility] #[id number] - [place],  - [address] [phone] (Phone) [phone])    Agent: Please be advised that RX refills may take up to 3 business days. We ask that you follow-up with your pharmacy.

## 2018-02-21 NOTE — Telephone Encounter (Signed)
Patient called back checking on status. He was informed that it can take up to 72 hrs for refill to process

## 2018-02-21 NOTE — Telephone Encounter (Signed)
Pt calling to check status  °

## 2018-02-21 NOTE — Telephone Encounter (Signed)
Dr Fry please advise. thanks 

## 2018-02-22 MED ORDER — TEMAZEPAM 30 MG PO CAPS: 30.0000 mg | ORAL_CAPSULE | Freq: Every evening | ORAL | 5 refills | Status: DC | PRN

## 2018-02-22 NOTE — Telephone Encounter (Signed)
Refill called to the pharmacy 

## 2018-02-22 NOTE — Telephone Encounter (Signed)
Call in #[phone] with 5 rf 

## 2018-03-29 ENCOUNTER — Encounter: Payer: Self-pay | Admitting: Family Medicine

## 2018-03-29 ENCOUNTER — Ambulatory Visit: Payer: Federal, State, Local not specified - PPO | Admitting: Family Medicine

## 2018-03-29 VITALS — BP 126/94 | HR 85 | Temp 98.6°F | Wt 200.0 lb

## 2018-03-29 DIAGNOSIS — M545 Low back pain: Secondary | ICD-10-CM | POA: Diagnosis not present

## 2018-03-29 DIAGNOSIS — F1190 Opioid use, unspecified, uncomplicated: Secondary | ICD-10-CM

## 2018-03-29 DIAGNOSIS — G8929 Other chronic pain: Secondary | ICD-10-CM | POA: Diagnosis not present

## 2018-03-29 MED ORDER — OXYCODONE-ACETAMINOPHEN 10-325 MG PO TABS: 1.0000 | ORAL_TABLET | Freq: Four times a day (QID) | ORAL | 0 refills | Status: DC | PRN

## 2018-03-29 NOTE — Progress Notes (Signed)
   Subjective:    Patient ID: Brian Bond, male    DOB: 12/27/1940, 78 y.o.   MRN: [id number]  HPI Here for pain management. He is doing well. He needs a new FMLA form filled out for work. 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  Indication for chronic opioid: low back pain  Medication and dose: Percocet 10-325 # pills per month: 120 Last UDS date: 03-29-18 Opioid Treatment Agreement signed (Y/N): 05-19-17 Opioid Treatment Agreement last reviewed with patient:  03-29-18 NCCSRS reviewed this encounter (include red flags):  03-29-18    Review of Systems  Constitutional: Negative.   Respiratory: Negative.   Cardiovascular: Negative.   Musculoskeletal: Positive for back pain.  Neurological: Negative.        Objective:   Physical Exam Constitutional:      Appearance: Normal appearance.  Cardiovascular:     Rate and Rhythm: Normal rate and regular rhythm.     Pulses: Normal pulses.     Heart sounds: Normal heart sounds.  Pulmonary:     Effort: Pulmonary effort is normal.     Breath sounds: Normal breath sounds.  Neurological:     General: No focal deficit present.     Mental Status: He is alert and oriented to person, place, and time.           Assessment & Plan:  Pain management, meds were refilled. The FMLA was filled out with restrictions of no bending or stooping and no lifting greater than 10 lbs.  Gershon Crane, MD

## 2018-04-01 LAB — PAIN MGMT, PROFILE 8 W/CONF, U
6 ACETYLMORPHINE: NEGATIVE ng/mL (ref <10)
ALPHAHYDROXYALPRAZOLAM: NEGATIVE ng/mL (ref <25)
AMINOCLONAZEPAM: NEGATIVE ng/mL (ref <25)
Alcohol Metabolites: NEGATIVE ng/mL (ref <500)
Alphahydroxymidazolam: NEGATIVE ng/mL (ref <50)
Alphahydroxytriazolam: NEGATIVE ng/mL (ref <50)
Amphetamines: NEGATIVE ng/mL (ref <500)
BUPRENORPHINE, URINE: NEGATIVE ng/mL (ref <5)
Benzodiazepines: POSITIVE ng/mL — AB (ref <100)
CREATININE: 137.9 mg/dL
Cocaine Metabolite: NEGATIVE ng/mL (ref <150)
Hydroxyethylflurazepam: NEGATIVE ng/mL (ref <50)
Lorazepam: NEGATIVE ng/mL (ref <50)
MDMA: NEGATIVE ng/mL (ref <500)
Marijuana Metabolite: NEGATIVE ng/mL (ref <20)
NOROXYCODONE: 936 ng/mL — AB (ref <50)
Nordiazepam: NEGATIVE ng/mL (ref <50)
OXAZEPAM: 516 ng/mL — AB (ref <50)
OXIDANT: NEGATIVE ug/mL (ref <200)
Opiates: NEGATIVE ng/mL (ref <100)
Oxycodone: 128 ng/mL — ABNORMAL HIGH (ref <50)
Oxycodone: POSITIVE ng/mL — AB (ref <100)
Oxymorphone: 249 ng/mL — ABNORMAL HIGH (ref <50)
PH: 6.64 (ref 4.5–9.0)
TEMAZEPAM: 8001 ng/mL — AB (ref <50)

## 2018-04-29 ENCOUNTER — Telehealth: Payer: Self-pay | Admitting: Family Medicine

## 2018-04-29 MED ORDER — OXYCODONE-ACETAMINOPHEN 10-325 MG PO TABS: 1.0000 | ORAL_TABLET | Freq: Four times a day (QID) | ORAL | 0 refills | Status: DC | PRN

## 2018-04-29 NOTE — Telephone Encounter (Signed)
Dr. Clent Ridges I called the pharmacy and they do not have the other 2 prescriptions from the Jan PMV.  Please advise on the refills. Thanks

## 2018-04-29 NOTE — Telephone Encounter (Signed)
Copied from CRM [id number]. Topic: General - Other >> Apr 29, 2018  9:38 AM Floria Raveling A wrote: Reason for CRM: pt called in and stated that the pharmacy did not have the oxyCODONE-acetaminophen (PERCOCET) 10-325 MG tablet [[id number]]  on file, they told him he would have to contact the dr for refills.  Pt should have refill already at the pharmacy .  [facility] #[id number] - [place], [place] - [address] [phone] (Phone)

## 2018-04-29 NOTE — Telephone Encounter (Signed)
Done for Feb and March

## 2018-06-07 ENCOUNTER — Telehealth: Payer: Self-pay

## 2018-06-07 NOTE — Telephone Encounter (Signed)
PA for oxyCODONE-acetaminophen (PERCOCET) 10-325 MG tablet has been sent to cover my meds.  KeyKarl Ito - PA Case ID: [id number]

## 2018-06-09 ENCOUNTER — Ambulatory Visit (INDEPENDENT_AMBULATORY_CARE_PROVIDER_SITE_OTHER): Payer: Federal, State, Local not specified - PPO | Admitting: Family Medicine

## 2018-06-09 ENCOUNTER — Encounter: Payer: Self-pay | Admitting: Family Medicine

## 2018-06-09 ENCOUNTER — Other Ambulatory Visit: Payer: Self-pay

## 2018-06-09 DIAGNOSIS — G8929 Other chronic pain: Secondary | ICD-10-CM

## 2018-06-09 DIAGNOSIS — M545 Low back pain, unspecified: Secondary | ICD-10-CM

## 2018-06-09 DIAGNOSIS — F1190 Opioid use, unspecified, uncomplicated: Secondary | ICD-10-CM

## 2018-06-09 MED ORDER — OXYCODONE-ACETAMINOPHEN 10-325 MG PO TABS: 1.0000 | ORAL_TABLET | Freq: Four times a day (QID) | ORAL | 0 refills | Status: DC | PRN

## 2018-06-09 MED ORDER — OXYCODONE-ACETAMINOPHEN 10-325 MG PO TABS: 1.0000 | ORAL_TABLET | Freq: Four times a day (QID) | ORAL | 0 refills | Status: AC | PRN

## 2018-06-09 NOTE — Progress Notes (Signed)
Subjective:    Patient ID: Brian Bond, male    DOB: 1940/11/16, 78 y.o.   MRN: [id number]  HPI Virtual Visit via Video Note  I connected with the patient on 06/09/18 at 10:15 AM EDT by a video enabled telemedicine application and verified that I am speaking with the correct person using two identifiers.  Location patient: home Location provider:work or home office Persons participating in the virtual visit: patient, provider  I discussed the limitations of evaluation and management by telemedicine and the availability of in person appointments. The patient expressed understanding and agreed to proceed.   HPI: This is for pain management. He is doing well.  Indication for chronic opioid: low back pain Medication and dose: Percocet 10-325 # pills per month: 120 Last UDS date: 03-29-18 Opioid Treatment Agreement signed (Y/N): 05-19-17 Opioid Treatment Agreement last reviewed with patient:  06-09-18 N[facility] reviewed this encounter (include red flags):  06-09-18    ROS: See pertinent positives and negatives per HPI.  Past Medical History:  Diagnosis Date  . Bell's palsy   . BPH (benign prostatic hyperplasia)   . Diverticula, colon   . Edema   . Insomnia   . Low back pain   . Osteoarthritis     Past Surgical History:  Procedure Laterality Date  . broken leg     right leg  . COLONOSCOPY  05-09-13   per Dr. Russella Dar, clear, no repeats needed     Family History  Problem Relation Age of Onset  . Breast cancer Sister   . Hypertension Unknown        family hx     Current Outpatient Medications:  .  oxyCODONE-acetaminophen (PERCOCET) 10-325 MG tablet, Take 1 tablet by mouth every 6 (six) hours as needed for up to 30 days for pain., Disp: 120 tablet, Rfl: 0 .  tadalafil (CIALIS) 20 MG tablet, Take 1 tablet (20 mg total) by mouth daily as needed for erectile dysfunction., Disp: 10 tablet, Rfl: 11 .  tamsulosin (FLOMAX) 0.4 MG CAPS capsule, Take 2 capsules (0.8 mg total) by  mouth daily., Disp: 180 capsule, Rfl: 3 .  temazepam (RESTORIL) 30 MG capsule, Take 1 capsule (30 mg total) by mouth at bedtime as needed., Disp: 30 capsule, Rfl: 5  EXAM:  VITALS per patient if applicable:  GENERAL: alert, oriented, appears well and in no acute distress  HEENT: atraumatic, conjunttiva clear, no obvious abnormalities on inspection of external nose and ears  NECK: normal movements of the head and neck  LUNGS: on inspection no signs of respiratory distress, breathing rate appears normal, no obvious gross SOB, gasping or wheezing  CV: no obvious cyanosis  MS: moves all visible extremities without noticeable abnormality  PSYCH/NEURO: pleasant and cooperative, no obvious depression or anxiety, speech and thought processing grossly intact  ASSESSMENT AND PLAN: Pain management, meds were refilled.  Gershon Crane, MD  Discussed the following assessment and plan:  No diagnosis found.     I discussed the assessment and treatment plan with the patient. The patient was provided an opportunity to ask questions and all were answered. The patient agreed with the plan and demonstrated an understanding of the instructions.   The patient was advised to call back or seek an in-person evaluation if the symptoms worsen or if the condition fails to improve as anticipated.     Review of Systems     Objective:   Physical Exam        Assessment & Plan:

## 2018-07-12 NOTE — Telephone Encounter (Signed)
PA has been approved

## 2018-08-10 ENCOUNTER — Telehealth: Payer: Self-pay | Admitting: Family Medicine

## 2018-08-10 NOTE — Telephone Encounter (Signed)
Copied from CRM [id number] [id number]. Topic: Quick Communication - Rx Refill/Question >> Aug 10, 2018  1:06 PM Tamela Oddi wrote: Medication: oxyCODONE-acetaminophen (PERCOCET) 10-325 MG tablet  Patient called to request a refill for the above medication  Preferred Pharmacy (with phone number or street name): [id number] - , [place] - [address] AT [address] [phone] (Phone) [phone] (Fax)

## 2018-08-11 NOTE — Telephone Encounter (Signed)
He already has refills until July 2  

## 2018-08-22 ENCOUNTER — Other Ambulatory Visit: Payer: Self-pay | Admitting: Family Medicine

## 2018-08-25 ENCOUNTER — Telehealth: Payer: Self-pay | Admitting: Family Medicine

## 2018-08-25 NOTE — Telephone Encounter (Signed)
Dr. Fry please advise of refill. Thanks 

## 2018-08-25 NOTE — Telephone Encounter (Signed)
Patient called to get an update on the refill he requested on temazepam (RESTORIL) 30 MG capsule states that he is in need of it.

## 2018-08-25 NOTE — Telephone Encounter (Signed)
Medication Refill - Medication: temazepam (RESTORIL) 30 MG capsule [[id number]]    Has the patient contacted their pharmacy? Yes.    (Agent: If yes, when and what did the pharmacy advise?) The pharmacy said that they had not received anything from the office. The patient is out of this medication.   Preferred Pharmacy (with phone number or street name):  [place], [address] [place] [phone] (Phone) [phone] (Fax)     Agent: Please be advised that RX refills may take up to 3 business days. We ask that you follow-up with your pharmacy.

## 2018-08-26 NOTE — Telephone Encounter (Signed)
I have already refilled this °

## 2018-08-26 NOTE — Telephone Encounter (Signed)
Dr. Fry please advise on refill. thanks 

## 2018-08-26 NOTE — Telephone Encounter (Signed)
Refill called to the pharmacy for the pt.  

## 2018-08-26 NOTE — Telephone Encounter (Signed)
Call in #[phone] with 5 rf 

## 2018-10-11 ENCOUNTER — Encounter: Payer: Self-pay | Admitting: Family Medicine

## 2018-10-11 ENCOUNTER — Ambulatory Visit (INDEPENDENT_AMBULATORY_CARE_PROVIDER_SITE_OTHER): Payer: Federal, State, Local not specified - PPO | Admitting: Family Medicine

## 2018-10-11 VITALS — BP 140/64 | HR 59 | Temp 98.7°F | Wt 196.2 lb

## 2018-10-11 DIAGNOSIS — R972 Elevated prostate specific antigen [PSA]: Secondary | ICD-10-CM

## 2018-10-11 DIAGNOSIS — Z125 Encounter for screening for malignant neoplasm of prostate: Secondary | ICD-10-CM | POA: Diagnosis not present

## 2018-10-11 DIAGNOSIS — Z0000 Encounter for general adult medical examination without abnormal findings: Secondary | ICD-10-CM

## 2018-10-11 LAB — LIPID PANEL
Cholesterol: 168 mg/dL (ref 0–200)
HDL: 59.2 mg/dL (ref >39.00)
LDL Cholesterol: 98 mg/dL (ref 0–99)
NonHDL: 108.99
Total CHOL/HDL Ratio: 3
Triglycerides: 56 mg/dL (ref 0.0–149.0)
VLDL: 11.2 mg/dL (ref 0.0–40.0)

## 2018-10-11 LAB — BASIC METABOLIC PANEL
BUN: 14 mg/dL (ref 6–23)
CO2: 29 mEq/L (ref 19–32)
Calcium: 9.2 mg/dL (ref 8.4–10.5)
Chloride: 107 mEq/L (ref 96–112)
Creatinine, Ser: 1.08 mg/dL (ref 0.40–1.50)
GFR: 80.23 mL/min (ref >60.00)
Glucose, Bld: 93 mg/dL (ref 70–99)
Potassium: 4.3 mEq/L (ref 3.5–5.1)
Sodium: 140 mEq/L (ref 135–145)

## 2018-10-11 LAB — CBC WITH DIFFERENTIAL/PLATELET
Basophils Absolute: 0 10*3/uL (ref 0.0–0.1)
Basophils Relative: 0.7 % (ref 0.0–3.0)
Eosinophils Absolute: 0.1 10*3/uL (ref 0.0–0.7)
Eosinophils Relative: 4 % (ref 0.0–5.0)
HCT: 44.4 % (ref 39.0–52.0)
Hemoglobin: 14.7 g/dL (ref 13.0–17.0)
Lymphocytes Relative: 39.6 % (ref 12.0–46.0)
Lymphs Abs: 1.3 10*3/uL (ref 0.7–4.0)
MCHC: 33 g/dL (ref 30.0–36.0)
MCV: 91.7 fl (ref 78.0–100.0)
Monocytes Absolute: 0.3 10*3/uL (ref 0.1–1.0)
Monocytes Relative: 10.3 % (ref 3.0–12.0)
Neutro Abs: 1.5 10*3/uL (ref 1.4–7.7)
Neutrophils Relative %: 45.4 % (ref 43.0–77.0)
Platelets: 192 10*3/uL (ref 150.0–400.0)
RBC: 4.84 Mil/uL (ref 4.22–5.81)
RDW: 13.8 % (ref 11.5–15.5)
WBC: 3.2 10*3/uL — ABNORMAL LOW (ref 4.0–10.5)

## 2018-10-11 LAB — POC URINALSYSI DIPSTICK (AUTOMATED)
Bilirubin, UA: NEGATIVE
Blood, UA: NEGATIVE
Glucose, UA: NEGATIVE
Ketones, UA: NEGATIVE
Leukocytes, UA: NEGATIVE
Nitrite, UA: NEGATIVE
Protein, UA: NEGATIVE
Spec Grav, UA: 1.015 (ref 1.010–1.025)
Urobilinogen, UA: 0.2 E.U./dL
pH, UA: 6 (ref 5.0–8.0)

## 2018-10-11 LAB — TSH: TSH: 1.23 u[IU]/mL (ref 0.35–4.50)

## 2018-10-11 LAB — HEPATIC FUNCTION PANEL
ALT: 23 U/L (ref 0–53)
AST: 23 U/L (ref 0–37)
Albumin: 4.2 g/dL (ref 3.5–5.2)
Alkaline Phosphatase: 61 U/L (ref 39–117)
Bilirubin, Direct: 0.2 mg/dL (ref 0.0–0.3)
Total Bilirubin: 1 mg/dL (ref 0.2–1.2)
Total Protein: 7.2 g/dL (ref 6.0–8.3)

## 2018-10-11 LAB — PSA: PSA: 4.26 ng/mL — ABNORMAL HIGH (ref 0.10–4.00)

## 2018-10-11 MED ORDER — TADALAFIL 20 MG PO TABS: 20.0000 mg | ORAL_TABLET | Freq: Every day | ORAL | 11 refills | Status: DC | PRN

## 2018-10-11 MED ORDER — OXYCODONE-ACETAMINOPHEN 10-325 MG PO TABS: 1.0000 | ORAL_TABLET | Freq: Four times a day (QID) | ORAL | 0 refills | Status: AC | PRN

## 2018-10-11 MED ORDER — TAMSULOSIN HCL 0.4 MG PO CAPS: 0.8000 mg | ORAL_CAPSULE | Freq: Every day | ORAL | 3 refills | Status: DC

## 2018-10-11 NOTE — Progress Notes (Signed)
   Subjective:    Patient ID: Brian Bond, male    DOB: November 03, 1940, 78 y.o.   MRN: [id number]  HPI Here for a well exam. He feels fine except for his arthritis pain. He still works full time.    Review of Systems  Constitutional: Negative.   HENT: Negative.   Eyes: Negative.   Respiratory: Negative.   Cardiovascular: Negative.   Gastrointestinal: Negative.   Genitourinary: Negative.   Musculoskeletal: Negative.   Skin: Negative.   Neurological: Negative.   Psychiatric/Behavioral: Negative.        Objective:   Physical Exam Constitutional:      General: He is not in acute distress.    Appearance: He is well-developed. He is not diaphoretic.  HENT:     Head: Normocephalic and atraumatic.     Right Ear: External ear normal.     Left Ear: External ear normal.     Nose: Nose normal.     Mouth/Throat:     Pharynx: No oropharyngeal exudate.  Eyes:     General: No scleral icterus.       Right eye: No discharge.        Left eye: No discharge.     Conjunctiva/sclera: Conjunctivae normal.     Pupils: Pupils are equal, round, and reactive to light.  Neck:     Musculoskeletal: Neck supple.     Thyroid: No thyromegaly.     Vascular: No JVD.     Trachea: No tracheal deviation.  Cardiovascular:     Rate and Rhythm: Normal rate and regular rhythm.     Heart sounds: Normal heart sounds. No murmur. No friction rub. No gallop.   Pulmonary:     Effort: Pulmonary effort is normal. No respiratory distress.     Breath sounds: Normal breath sounds. No wheezing or rales.  Chest:     Chest wall: No tenderness.  Abdominal:     General: Bowel sounds are normal. There is no distension.     Palpations: Abdomen is soft. There is no mass.     Tenderness: There is no abdominal tenderness. There is no guarding or rebound.  Genitourinary:    Penis: Normal. No tenderness.      Prostate: Normal.     Rectum: Normal. Guaiac result negative.  Musculoskeletal: Normal range of motion.      General: No tenderness.  Lymphadenopathy:     Cervical: No cervical adenopathy.  Skin:    General: Skin is warm and dry.     Coloration: Skin is not pale.     Findings: No erythema or rash.  Neurological:     Mental Status: He is alert and oriented to person, place, and time.     Cranial Nerves: No cranial nerve deficit.     Motor: No abnormal muscle tone.     Coordination: Coordination normal.     Deep Tendon Reflexes: Reflexes are normal and symmetric. Reflexes normal.  Psychiatric:        Behavior: Behavior normal.        Thought Content: Thought content normal.        Judgment: Judgment normal.           Assessment & Plan:  Well exam. We discussed diet and exercise. Get fasting labs.  Alysia Penna, MD

## 2018-10-12 NOTE — Addendum Note (Signed)
Addended by: Alysia Penna A on: 10/12/2018 12:41 PM   Modules accepted: Orders

## 2018-10-17 ENCOUNTER — Ambulatory Visit: Payer: Federal, State, Local not specified - PPO | Admitting: Family Medicine

## 2018-10-17 ENCOUNTER — Other Ambulatory Visit: Payer: Self-pay

## 2018-10-19 ENCOUNTER — Other Ambulatory Visit: Payer: Self-pay | Admitting: Family Medicine

## 2018-10-20 ENCOUNTER — Telehealth: Payer: Self-pay | Admitting: Family Medicine

## 2018-10-20 NOTE — Telephone Encounter (Signed)
Called patient. No answer. LVM for patient to return call to make a PMV appointment

## 2018-10-20 NOTE — Telephone Encounter (Signed)
Medication Refill - Medication: Percocet  Has the patient contacted their pharmacy? Pt states the pharmacy hasn't heard anything from the office (Agent: If no, request that the patient contact the pharmacy for the refill.) (Agent: If yes, when and what did the pharmacy advise?)  Preferred Pharmacy (with phone number or street name):  [facility], [place] - [address] [address] [phone] (Phone) [phone] (Fax)      Agent: Please be advised that RX refills may take up to 3 business days. We ask that you follow-up with your pharmacy.

## 2018-10-20 NOTE — Telephone Encounter (Signed)
He needs a PMV for this  

## 2018-10-20 NOTE — Telephone Encounter (Signed)
I did not see a referral for pain clinic I called the patient back left msg  To call for clarification awaiting for the pt to call back  Will send to Dr fry to advise on     Copied from [place] [phone]. Topic: General - Other >> Oct 19, 2018  9:54 AM [name] wrote: Reason for CRM: Patient called to say that he was waiting to hear back from Pain management on Tuesday asking for a call from someone to inform him on what to do since he heard nothing yet. Please call Ph# [phone]

## 2018-10-20 NOTE — Telephone Encounter (Signed)
Dr. Fry please advise 

## 2018-10-21 ENCOUNTER — Telehealth: Payer: Self-pay | Admitting: Family Medicine

## 2018-10-21 NOTE — Telephone Encounter (Incomplete)
Medication Refill - Medication: percocet  Has the patient contacted their pharmacy? No. (Agent: If no, request that the patient contact the pharmacy for the refill.) (Agent: If yes, when and what did the pharmacy advise?)  Preferred Pharmacy (with phone number or street name): [facility]    Agent: Please be advised that RX refills may take up to 3 business days. We ask that you follow-up with your pharmacy.

## 2018-10-21 NOTE — Telephone Encounter (Signed)
Left message for patient to call back. CRM created 

## 2018-10-21 NOTE — Telephone Encounter (Signed)
Appointment has been scheduled. Patient is aware that Rx will be filled during his visit.

## 2018-10-21 NOTE — Telephone Encounter (Signed)
See request °

## 2018-10-21 NOTE — Telephone Encounter (Signed)
He just needs a Doxy visit with me to refill his pain medication

## 2018-10-21 NOTE — Telephone Encounter (Signed)
PMV appointment scheduled. Patient is aware that Rx will be filled during his appointment.

## 2018-10-24 ENCOUNTER — Other Ambulatory Visit: Payer: Self-pay

## 2018-10-24 ENCOUNTER — Telehealth (INDEPENDENT_AMBULATORY_CARE_PROVIDER_SITE_OTHER): Payer: Federal, State, Local not specified - PPO | Admitting: Family Medicine

## 2018-10-24 DIAGNOSIS — F1190 Opioid use, unspecified, uncomplicated: Secondary | ICD-10-CM

## 2018-10-24 DIAGNOSIS — G8929 Other chronic pain: Secondary | ICD-10-CM

## 2018-10-24 DIAGNOSIS — M545 Low back pain: Secondary | ICD-10-CM | POA: Diagnosis not present

## 2018-10-24 MED ORDER — OXYCODONE-ACETAMINOPHEN 10-325 MG PO TABS: 1.0000 | ORAL_TABLET | Freq: Four times a day (QID) | ORAL | 0 refills | Status: AC | PRN

## 2018-10-24 MED ORDER — OXYCODONE-ACETAMINOPHEN 10-325 MG PO TABS: 1.0000 | ORAL_TABLET | Freq: Four times a day (QID) | ORAL | 0 refills | Status: DC | PRN

## 2018-10-25 ENCOUNTER — Encounter: Payer: Self-pay | Admitting: Family Medicine

## 2018-10-25 NOTE — Progress Notes (Signed)
Virtual Visit via Telephone Note  I connected with the patient on 10/25/18 at  2:45 PM EDT by telephone and verified that I am speaking with the correct person using two identifiers. We attempted to connect virtually but we had technical difficulties with the audio and video.     I discussed the limitations, risks, security and privacy concerns of performing an evaluation and management service by telephone and the availability of in person appointments. I also discussed with the patient that there may be a patient responsible charge related to this service. The patient expressed understanding and agreed to proceed.  Location patient: home Location provider: work or home office Participants present for the call: patient, provider Patient did not have a visit in the prior 7 days to address this/these issue(s).   History of Present Illness: Here for pain management. He is doing well.  Indication for chronic opioid: low back pain Medication and dose: Percocet 10-325 # pills per month: 120 Last UDS date: 03-29-18 Opioid Treatment Agreement signed (Y/N): 05-19-17 Opioid Treatment Agreement last reviewed with patient:  10-24-18 [facility] reviewed this encounter (include red flags):  10-24-18    Observations/Objective: Patient sounds cheerful and well on the phone. I do not appreciate any SOB. Speech and thought processing are grossly intact. Patient reported vitals:  Assessment and Plan: Pain management, meds were refilled.  Alysia Penna, MD   Follow Up Instructions:     [phone] 5-10 [phone] 11-20 [phone] 21-30 I did not refer this patient for an OV in the next 24 hours for this/these issue(s).  I discussed the assessment and treatment plan with the patient. The patient was provided an opportunity to ask questions and all were answered. The patient agreed with the plan and demonstrated an understanding of the instructions.   The patient was advised to call back or seek an in-person  evaluation if the symptoms worsen or if the condition fails to improve as anticipated.  I provided 14 minutes of non-face-to-face time during this encounter.   Alysia Penna, MD

## 2018-10-28 DIAGNOSIS — H2513 Age-related nuclear cataract, bilateral: Secondary | ICD-10-CM | POA: Diagnosis not present

## 2018-10-28 DIAGNOSIS — H524 Presbyopia: Secondary | ICD-10-CM | POA: Diagnosis not present

## 2018-10-28 DIAGNOSIS — H25013 Cortical age-related cataract, bilateral: Secondary | ICD-10-CM | POA: Diagnosis not present

## 2018-10-28 DIAGNOSIS — H40013 Open angle with borderline findings, low risk, bilateral: Secondary | ICD-10-CM | POA: Diagnosis not present

## 2018-11-01 ENCOUNTER — Telehealth: Payer: Self-pay | Admitting: Family Medicine

## 2018-11-01 NOTE — Telephone Encounter (Signed)
I called pt x2 left a msg on VM to call our office back need clarification about message . I did not see a referral for pain clinic awiting pt to return my call   Copied from [place] [phone]. Topic: Referral - Question >> Oct 21, 2018 11:44 AM Nils Flack wrote:   Reason for CRM pt is calling about status of pain mgmt referral.  Please advise

## 2018-11-09 ENCOUNTER — Telehealth: Payer: Self-pay

## 2018-11-09 NOTE — Telephone Encounter (Signed)
Spoke with patient he is aware of his results. They have been mailed.

## 2018-11-09 NOTE — Telephone Encounter (Signed)
Copied from [place] [phone]. Topic: General - Other >> Nov 01, 2018  5:03 PM Rayann Heman wrote: Reason for CRM: pt called and stated that he would like to come in and pick up a copy of lab results. Please advise >> Nov 08, 2018 10:32 AM Lonia Mad B wrote: Called to patient at both #'s [phone] & [phone]. Lft msg explaining ROI procedures and lft my call back phone #. 9/1/20fbg  >> Nov 08, 2018 10:04 AM Cox, Melburn Hake, CMA wrote: Per chart pt has been given lab results via phone:  Notes recorded by Rebecca Eaton, [place] on 10/12/2018 at 2:13 PM EDT  Discussed results with patient, patient expressed understanding. Patient would like his results mailed to him. >> Nov 08, 2018  9:54 AM Mathis Bud wrote: Patient is stating he never got a call to go over lab results and would like to pick up lab results. Patient is worried about his lab results, patient states he has been waiting a month  Patient call back [phone] >> Nov 02, 2018  9:23 AM Lonia Mad B wrote: Left msg at [phone] for return call, explained ROI procedures 8/26/20fbg

## 2018-11-28 DIAGNOSIS — H40013 Open angle with borderline findings, low risk, bilateral: Secondary | ICD-10-CM | POA: Diagnosis not present

## 2019-01-30 ENCOUNTER — Encounter: Payer: Self-pay | Admitting: Family Medicine

## 2019-01-30 ENCOUNTER — Ambulatory Visit (INDEPENDENT_AMBULATORY_CARE_PROVIDER_SITE_OTHER): Payer: Federal, State, Local not specified - PPO | Admitting: Family Medicine

## 2019-01-30 ENCOUNTER — Other Ambulatory Visit: Payer: Self-pay

## 2019-01-30 VITALS — BP 124/80 | HR 74 | Temp 98.3°F | Wt 196.8 lb

## 2019-01-30 DIAGNOSIS — M545 Low back pain, unspecified: Secondary | ICD-10-CM

## 2019-01-30 DIAGNOSIS — F1190 Opioid use, unspecified, uncomplicated: Secondary | ICD-10-CM | POA: Diagnosis not present

## 2019-01-30 DIAGNOSIS — G8929 Other chronic pain: Secondary | ICD-10-CM

## 2019-01-30 MED ORDER — OXYCODONE-ACETAMINOPHEN 10-325 MG PO TABS: 1.0000 | ORAL_TABLET | Freq: Four times a day (QID) | ORAL | 0 refills | Status: DC | PRN

## 2019-01-30 MED ORDER — OXYCODONE-ACETAMINOPHEN 10-325 MG PO TABS: 1.0000 | ORAL_TABLET | Freq: Four times a day (QID) | ORAL | 0 refills | Status: AC | PRN

## 2019-01-30 NOTE — Progress Notes (Signed)
   Subjective:    Patient ID: Brian Bond, male    DOB: 12-03-1940, 78 y.o.   MRN: [id number]  HPI Here for pain management. He does well most days but sometimes his job requires him to do more bending or stooping than usual, and this causes more pain.  Indication for chronic opioid: low back pain Medication and dose: Percocet 10-325 # pills per month: 120 Last UDS date: 03-29-18 Opioid Treatment Agreement signed (Y/N): 05-19-17 Opioid Treatment Agreement last reviewed with patient:  01-30-19 [place] reviewed this encounter (include red flags): Yes    Review of Systems     Objective:   Physical Exam        Assessment & Plan:  Pain management, meds were refilled. I suggested he add 800 mg of Ibuprofen to the Percocet on days when he has more pain.  Alysia Penna, MD

## 2019-01-31 ENCOUNTER — Telehealth: Payer: Self-pay | Admitting: Family Medicine

## 2019-01-31 NOTE — Telephone Encounter (Signed)
Pt called in stating that he needs a prior auth for his percocet.  Pt state that he needs that medication today b/c he is in pain and wanted to know if I could send it in I pt is aware that I am a scheduler and I will send the msg back so that the provider can send the information in.  Pt wanted me to guarantee that the medication would be sent in today I let him know that I could not but I will send the msg to the provider and the provider would read the msg in between his patients.  Pt state that he would call back later today around 3:00 p.m. to make sure that his medication was sent in b/c he needs the medication.

## 2019-01-31 NOTE — Telephone Encounter (Signed)
Called pt for update but no answer. PA has been approved. Pt Rx should be able to refill

## 2019-02-15 ENCOUNTER — Other Ambulatory Visit: Payer: Self-pay | Admitting: Family Medicine

## 2019-02-15 NOTE — Telephone Encounter (Signed)
Okay for refill?  

## 2019-04-19 ENCOUNTER — Other Ambulatory Visit: Payer: Self-pay

## 2019-04-19 ENCOUNTER — Other Ambulatory Visit (INDEPENDENT_AMBULATORY_CARE_PROVIDER_SITE_OTHER): Payer: Federal, State, Local not specified - PPO

## 2019-04-19 DIAGNOSIS — R972 Elevated prostate specific antigen [PSA]: Secondary | ICD-10-CM

## 2019-04-19 LAB — PSA: PSA: 3.23 ng/mL (ref 0.10–4.00)

## 2019-05-08 ENCOUNTER — Other Ambulatory Visit: Payer: Self-pay

## 2019-05-08 ENCOUNTER — Telehealth: Payer: Federal, State, Local not specified - PPO | Admitting: Family Medicine

## 2019-05-09 ENCOUNTER — Telehealth (INDEPENDENT_AMBULATORY_CARE_PROVIDER_SITE_OTHER): Payer: Federal, State, Local not specified - PPO | Admitting: Family Medicine

## 2019-05-09 ENCOUNTER — Other Ambulatory Visit: Payer: Self-pay

## 2019-05-09 DIAGNOSIS — G8929 Other chronic pain: Secondary | ICD-10-CM

## 2019-05-09 DIAGNOSIS — M545 Low back pain, unspecified: Secondary | ICD-10-CM

## 2019-05-09 DIAGNOSIS — F1190 Opioid use, unspecified, uncomplicated: Secondary | ICD-10-CM | POA: Diagnosis not present

## 2019-05-09 MED ORDER — OXYCODONE-ACETAMINOPHEN 10-325 MG PO TABS: 1.0000 | ORAL_TABLET | ORAL | 0 refills | Status: DC | PRN

## 2019-05-09 NOTE — Progress Notes (Signed)
Virtual Visit via Telephone Note  I connected with the patient on 05/09/19 at  3:30 PM EST by telephone and verified that I am speaking with the correct person using two identifiers.   I discussed the limitations, risks, security and privacy concerns of performing an evaluation and management service by telephone and the availability of in person appointments. I also discussed with the patient that there may be a patient responsible charge related to this service. The patient expressed understanding and agreed to proceed.  Location patient: home Location provider: work or home office Participants present for the call: patient, provider Patient did not have a visit in the prior 7 days to address this/these issue(s).   History of Present Illness: Here for pain management. His back pain has gotten a bit worse and he tends to develop pain in between his doses of Percocet. He is still able to work.  Indication for chronic opioid: low back pain Medication and dose: Percoet 10-325 # pills per month: 120 Last UDS date: 03-29-18 Opioid Treatment Agreement signed (Y/N): 05-19-17 Opioid Treatment Agreement last reviewed with patient:  05-09-19 [facility] reviewed this encounter (include red flags): Yes    Observations/Objective: Patient sounds cheerful and well on the phone. I do not appreciate any SOB. Speech and thought processing are grossly intact. Patient reported vitals:  Assessment and Plan: Pain management, we will increase the supply of Percocet to take this every 4 hours as needed. Gershon Crane, MD   Follow Up Instructions:     [phone] 5-10 [phone] 11-20 9443 21-30 I did not refer this patient for an OV in the next 24 hours for this/these issue(s).  I discussed the assessment and treatment plan with the patient. The patient was provided an opportunity to ask questions and all were answered. The patient agreed with the plan and demonstrated an understanding of the instructions.   The  patient was advised to call back or seek an in-person evaluation if the symptoms worsen or if the condition fails to improve as anticipated.  I provided 10 minutes of non-face-to-face time during this encounter.   Gershon Crane, MD

## 2019-06-19 ENCOUNTER — Telehealth: Payer: Self-pay | Admitting: Family Medicine

## 2019-06-19 DIAGNOSIS — N138 Other obstructive and reflux uropathy: Secondary | ICD-10-CM

## 2019-06-19 DIAGNOSIS — N401 Enlarged prostate with lower urinary tract symptoms: Secondary | ICD-10-CM

## 2019-06-19 NOTE — Telephone Encounter (Signed)
I did the referral to Urology  

## 2019-06-19 NOTE — Telephone Encounter (Signed)
Patient is aware 

## 2019-06-19 NOTE — Telephone Encounter (Signed)
Pt would like a referral to an urinologist. Pt states the medication flomax is not working and would like to see a specialist   Pt can be reached at [phone]

## 2019-06-23 ENCOUNTER — Other Ambulatory Visit: Payer: Self-pay

## 2019-06-23 ENCOUNTER — Encounter: Payer: Self-pay | Admitting: Family Medicine

## 2019-06-23 ENCOUNTER — Ambulatory Visit (INDEPENDENT_AMBULATORY_CARE_PROVIDER_SITE_OTHER): Payer: Federal, State, Local not specified - PPO | Admitting: Family Medicine

## 2019-06-23 VITALS — BP 120/70 | HR 52 | Temp 98.0°F | Wt 198.4 lb

## 2019-06-23 DIAGNOSIS — N138 Other obstructive and reflux uropathy: Secondary | ICD-10-CM | POA: Diagnosis not present

## 2019-06-23 DIAGNOSIS — N401 Enlarged prostate with lower urinary tract symptoms: Secondary | ICD-10-CM | POA: Diagnosis not present

## 2019-06-23 NOTE — Progress Notes (Signed)
   Subjective:    Patient ID: Brian Bond, male    DOB: February 21, 1941, 79 y.o.   MRN: [id number]  HPI Here to talk about his BPH. He has had good results with Flomax for several years, but lately it doe snot seem to work very well (even on the 0.8 mg dose). He has to urinate frequently during the day andhe gets up 3-4 times at night. This is interfering with his work. There is mild urgency but no discomfort. He has never had an accident.    Review of Systems  Constitutional: Negative.   Respiratory: Negative.   Cardiovascular: Negative.   Genitourinary: Positive for frequency and urgency. Negative for dysuria, flank pain and hematuria.       Objective:   Physical Exam Constitutional:      Appearance: Normal appearance.  Cardiovascular:     Rate and Rhythm: Normal rate and regular rhythm.     Pulses: Normal pulses.     Heart sounds: Normal heart sounds.  Pulmonary:     Effort: Pulmonary effort is normal.     Breath sounds: Normal breath sounds.  Neurological:     Mental Status: He is alert.           Assessment & Plan:  BPH, we will refer him to Urology to evaluate.  Gershon Crane, MD

## 2019-06-29 ENCOUNTER — Ambulatory Visit: Payer: Federal, State, Local not specified - PPO | Attending: Internal Medicine

## 2019-06-29 DIAGNOSIS — Z23 Encounter for immunization: Secondary | ICD-10-CM

## 2019-06-29 NOTE — Progress Notes (Signed)
   Covid-19 Vaccination Clinic  Name:  ZEN CEDILLOS    MRN: [id number] DOB: [birth year]  06/29/2019  Mr. Kallman was observed post Covid-19 immunization for 15 minutes without incident. He was provided with Vaccine Information Sheet and instruction to access the V-Safe system.   Mr. Olguin was instructed to call 911 with any severe reactions post vaccine: Marland Kitchen Difficulty breathing  . Swelling of face and throat  . A fast heartbeat  . A bad rash all over body  . Dizziness and weakness   Immunizations Administered    Name Date Dose VIS Date Route   Pfizer COVID-19 Vaccine 06/29/2019  4:47 PM 0.3 mL 05/03/2018 Intramuscular   Manufacturer: [employer], [employer]   Lot: [id number]   NDC: [address]-4

## 2019-07-21 ENCOUNTER — Telehealth: Payer: Self-pay | Admitting: Family Medicine

## 2019-07-21 MED ORDER — OXYCODONE-ACETAMINOPHEN 10-325 MG PO TABS: 1.0000 | ORAL_TABLET | ORAL | 0 refills | Status: AC | PRN

## 2019-07-21 NOTE — Telephone Encounter (Signed)
Pt called stating pharmacy never received prescription from 5/2/2021and pt never filled. Pt states will call [facility] again.

## 2019-07-21 NOTE — Telephone Encounter (Signed)
Left a detailed message on verified voice mail.   

## 2019-07-21 NOTE — Telephone Encounter (Signed)
Last filled 07/09/2019 Last OV 06/23/2019  Ok to fill?

## 2019-07-21 NOTE — Addendum Note (Signed)
Addended by: Gershon Crane A on: 07/21/2019 04:44 PM   Modules accepted: Orders

## 2019-07-21 NOTE — Telephone Encounter (Signed)
Pt is calling in stating that he needs a refill on Rx oxycodone (PERCOCET) 10-325 MG.  Pharm: [facility] [address]

## 2019-07-21 NOTE — Telephone Encounter (Signed)
Done

## 2019-07-24 ENCOUNTER — Ambulatory Visit: Payer: Federal, State, Local not specified - PPO | Attending: Internal Medicine

## 2019-07-24 DIAGNOSIS — Z23 Encounter for immunization: Secondary | ICD-10-CM

## 2019-07-24 NOTE — Progress Notes (Signed)
   Covid-19 Vaccination Clinic  Name:  Brian Bond    MRN: [id number] DOB: [birth year]  07/24/2019  [name] was observed post Covid-19 immunization for 15 minutes without incident. He was provided with Vaccine Information Sheet and instruction to access the V-Safe system.   Mr. Forget was instructed to call 911 with any severe reactions post vaccine: Marland Kitchen Difficulty breathing  . Swelling of face and throat  . A fast heartbeat  . A bad rash all over body  . Dizziness and weakness   Immunizations Administered    Name Date Dose VIS Date Route   Pfizer COVID-19 Vaccine 07/24/2019  4:29 PM 0.3 mL 05/03/2018 Intramuscular   Manufacturer: [employer], [employer]   Lot: [id number]   NDC: [address]-7

## 2019-08-09 DIAGNOSIS — R350 Frequency of micturition: Secondary | ICD-10-CM | POA: Diagnosis not present

## 2019-08-09 DIAGNOSIS — R3912 Poor urinary stream: Secondary | ICD-10-CM | POA: Diagnosis not present

## 2019-08-09 DIAGNOSIS — R972 Elevated prostate specific antigen [PSA]: Secondary | ICD-10-CM | POA: Diagnosis not present

## 2019-08-09 DIAGNOSIS — N401 Enlarged prostate with lower urinary tract symptoms: Secondary | ICD-10-CM | POA: Diagnosis not present

## 2019-08-21 ENCOUNTER — Other Ambulatory Visit: Payer: Self-pay | Admitting: Family Medicine

## 2019-08-21 NOTE — Telephone Encounter (Signed)
Last filled 02/16/2019 Last OV 06/23/2019  Ok to fill?

## 2019-08-28 ENCOUNTER — Other Ambulatory Visit: Payer: Self-pay

## 2019-08-28 ENCOUNTER — Ambulatory Visit: Payer: Federal, State, Local not specified - PPO | Admitting: Family Medicine

## 2019-08-28 ENCOUNTER — Encounter: Payer: Self-pay | Admitting: Family Medicine

## 2019-08-28 VITALS — BP 120/60 | HR 54 | Temp 97.7°F | Wt 200.2 lb

## 2019-08-28 DIAGNOSIS — G8929 Other chronic pain: Secondary | ICD-10-CM

## 2019-08-28 DIAGNOSIS — F1190 Opioid use, unspecified, uncomplicated: Secondary | ICD-10-CM | POA: Diagnosis not present

## 2019-08-28 DIAGNOSIS — M545 Low back pain: Secondary | ICD-10-CM | POA: Diagnosis not present

## 2019-08-28 MED ORDER — OXYCODONE-ACETAMINOPHEN 10-325 MG PO TABS: 1.0000 | ORAL_TABLET | Freq: Four times a day (QID) | ORAL | 0 refills | Status: DC | PRN

## 2019-08-28 NOTE — Progress Notes (Signed)
   Subjective:    Patient ID: Brian Bond, male    DOB: 09-16-1940, 79 y.o.   MRN: [id number]  HPI Here for pain management, he is doing well.  Indication for chronic opioid: low back pain Medication and dose: Percocet 10-325 # pills per month: 120 Last UDS date: 03-29-18 Opioid Treatment Agreement signed (Y/N): 05-19-17 Opioid Treatment Agreement last reviewed with patient:  08-28-19 [facility] reviewed this encounter (include red flags): Yes    Review of Systems     Objective:   Physical Exam        Assessment & Plan:  Pain management, meds were refilled.  Gershon Crane, MD

## 2019-08-30 ENCOUNTER — Telehealth: Payer: Self-pay | Admitting: Family Medicine

## 2019-08-30 NOTE — Telephone Encounter (Signed)
Please call for a PA

## 2019-08-30 NOTE — Telephone Encounter (Signed)
Spoke with the patients pharmacy. They stated that the insurance will pay for a Max 270 tab per 72 days and that the patient was made aware of this. Pharmacy stated that since he has federal insurance she does not think this will be covered even with a PA.  Please advise.

## 2019-08-30 NOTE — Telephone Encounter (Signed)
Pt is calling in stating that he was not able to get his pain medication from the pharmacy yesterday and the office need to call to get the medication overridden [phone].  Pt was very vague with his information kept stating "I just need my pain medication".

## 2019-08-31 NOTE — Telephone Encounter (Signed)
Pt would like to be notified when this has been approved or denied.  Pt can be reached at [phone]

## 2019-08-31 NOTE — Telephone Encounter (Signed)
PA has been sent to cover my meds.   Key: [id number] - PA Case ID: [id number]  Left message for patient to call back. To let him know a PA has been started for his medication.

## 2019-09-01 LAB — DRUG MONITORING, PANEL 8 WITH CONFIRMATION, URINE
6 Acetylmorphine: NEGATIVE ng/mL (ref <10)
Alcohol Metabolites: POSITIVE ng/mL — AB
Alphahydroxyalprazolam: NEGATIVE ng/mL (ref <25)
Alphahydroxymidazolam: NEGATIVE ng/mL (ref <50)
Alphahydroxytriazolam: NEGATIVE ng/mL (ref <50)
Aminoclonazepam: NEGATIVE ng/mL (ref <25)
Amphetamines: NEGATIVE ng/mL (ref <500)
Benzodiazepines: POSITIVE ng/mL — AB (ref <100)
Buprenorphine, Urine: NEGATIVE ng/mL (ref <5)
Cocaine Metabolite: NEGATIVE ng/mL (ref <150)
Codeine: 51 ng/mL — ABNORMAL HIGH (ref <50)
Creatinine: 291.5 mg/dL
Ethyl Glucuronide (ETG): 694 ng/mL — ABNORMAL HIGH (ref <500)
Ethyl Sulfate (ETS): 344 ng/mL — ABNORMAL HIGH (ref <100)
Hydrocodone: NEGATIVE ng/mL (ref <50)
Hydromorphone: NEGATIVE ng/mL (ref <50)
Hydroxyethylflurazepam: NEGATIVE ng/mL (ref <50)
Lorazepam: NEGATIVE ng/mL (ref <50)
MDMA: NEGATIVE ng/mL (ref <500)
Marijuana Metabolite: NEGATIVE ng/mL (ref <20)
Morphine: NEGATIVE ng/mL (ref <50)
Nordiazepam: NEGATIVE ng/mL (ref <50)
Norhydrocodone: NEGATIVE ng/mL (ref <50)
Noroxycodone: >10000 ng/mL — ABNORMAL HIGH (ref <50)
Opiates: POSITIVE ng/mL — AB (ref <100)
Oxazepam: 825 ng/mL — ABNORMAL HIGH (ref <50)
Oxidant: NEGATIVE ug/mL
Oxycodone: >10000 ng/mL — ABNORMAL HIGH (ref <50)
Oxycodone: POSITIVE ng/mL — AB (ref <100)
Oxymorphone: 3503 ng/mL — ABNORMAL HIGH (ref <50)
Temazepam: >8000 ng/mL — ABNORMAL HIGH (ref <50)
pH: 5.5 (ref 4.5–9.0)

## 2019-09-01 LAB — DM TEMPLATE

## 2019-09-01 NOTE — Telephone Encounter (Signed)
Noted  

## 2019-09-01 NOTE — Telephone Encounter (Signed)
Approvedon June 24 Your PA request has been approved. Additional information will be provided in the approval communication. (Message [phone])  Left a detailed message on verified voice mail informing patient his PA has been approved and that he should be able to pick up his medication now.

## 2019-09-01 NOTE — Telephone Encounter (Signed)
Pt calling in wanting to check the status of the PA so that he can get his medication.  He is aware that we are waiting to hear back from the insurance company with the approval.

## 2019-10-02 ENCOUNTER — Other Ambulatory Visit: Payer: Self-pay

## 2019-10-02 ENCOUNTER — Ambulatory Visit: Payer: Federal, State, Local not specified - PPO | Admitting: Family Medicine

## 2019-10-02 ENCOUNTER — Encounter: Payer: Self-pay | Admitting: Family Medicine

## 2019-10-02 VITALS — BP 122/60 | HR 60 | Temp 98.2°F | Wt 201.2 lb

## 2019-10-02 DIAGNOSIS — F1190 Opioid use, unspecified, uncomplicated: Secondary | ICD-10-CM

## 2019-10-02 DIAGNOSIS — M545 Low back pain, unspecified: Secondary | ICD-10-CM

## 2019-10-02 DIAGNOSIS — G8929 Other chronic pain: Secondary | ICD-10-CM

## 2019-10-02 MED ORDER — OXYCODONE HCL 20 MG PO TABS: 20.0000 mg | ORAL_TABLET | Freq: Four times a day (QID) | ORAL | 0 refills | Status: AC | PRN

## 2019-10-02 MED ORDER — OXYCODONE HCL 20 MG PO TABS: 20.0000 mg | ORAL_TABLET | Freq: Four times a day (QID) | ORAL | 0 refills | Status: DC | PRN

## 2019-10-02 NOTE — Progress Notes (Signed)
   Subjective:    Patient ID: Brian Bond, male    DOB: November 22, 1940, 79 y.o.   MRN: [id number]  HPI Here for pain management. He has been using Percocet 10-325 and we recently went from dosing this every 6 hours to every 4 hours, but this has not been working. He is also concerned about any effects on his liver from the acetominophen.  Indication for chronic opioid: low back pain  Medication and dose: Oxycodone 20 mg  # pills per month: 120 Last UDS date: 08-28-19 Opioid Treatment Agreement signed (Y/N): 05-19-17 Opioid Treatment Agreement last reviewed with patient:  10-02-19 [facility] reviewed this encounter (include red flags):  10-02-19    Review of Systems     Objective:   Physical Exam        Assessment & Plan:  Pain management, we will switch to Oxycodone 20 mg as needed.  Gershon Crane, MD

## 2019-10-09 DIAGNOSIS — R3912 Poor urinary stream: Secondary | ICD-10-CM | POA: Diagnosis not present

## 2019-10-09 DIAGNOSIS — R350 Frequency of micturition: Secondary | ICD-10-CM | POA: Diagnosis not present

## 2019-10-09 DIAGNOSIS — N401 Enlarged prostate with lower urinary tract symptoms: Secondary | ICD-10-CM | POA: Diagnosis not present

## 2019-10-24 ENCOUNTER — Encounter: Payer: Self-pay | Admitting: Family Medicine

## 2019-10-24 ENCOUNTER — Other Ambulatory Visit: Payer: Self-pay

## 2019-10-24 ENCOUNTER — Ambulatory Visit (INDEPENDENT_AMBULATORY_CARE_PROVIDER_SITE_OTHER): Payer: Federal, State, Local not specified - PPO | Admitting: Family Medicine

## 2019-10-24 VITALS — BP 122/60 | HR 84 | Temp 98.6°F | Ht 77.0 in | Wt 210.6 lb

## 2019-10-24 DIAGNOSIS — Z0000 Encounter for general adult medical examination without abnormal findings: Secondary | ICD-10-CM

## 2019-10-24 DIAGNOSIS — L209 Atopic dermatitis, unspecified: Secondary | ICD-10-CM

## 2019-10-24 MED ORDER — TADALAFIL 20 MG PO TABS: 20.0000 mg | ORAL_TABLET | Freq: Every day | ORAL | 11 refills | Status: DC | PRN

## 2019-10-24 MED ORDER — TRIAMCINOLONE ACETONIDE 0.1 % EX CREA: 1.0000 | TOPICAL_CREAM | Freq: Two times a day (BID) | CUTANEOUS | 2 refills | Status: DC

## 2019-10-24 MED ORDER — METHYLPREDNISOLONE ACETATE 80 MG/ML IJ SUSP
80.0000 mg | Freq: Once | INTRAMUSCULAR | Status: AC
  Administered 2019-10-24: 80 mg via INTRAMUSCULAR

## 2019-10-24 NOTE — Progress Notes (Signed)
Subjective:    Patient ID: Brian Bond, male    DOB: Aug 29, 1940, 79 y.o.   MRN: [id number]  HPI Here for a well exam. His only concern today is an itchy rash that appeared about 2 weeks ago. This involves his arms, legs, trunk, and even his ear lobes. He has never had this before. Of note 4 weeks ago he started taking an herbal supplement OTC that is supposed to help men's prostates. This contains a number of plant products.    Review of Systems  Constitutional: Negative.   HENT: Negative.   Eyes: Negative.   Respiratory: Negative.   Cardiovascular: Negative.   Gastrointestinal: Negative.   Genitourinary: Negative.   Musculoskeletal: Positive for back pain.  Skin: Positive for rash.  Neurological: Negative.   Psychiatric/Behavioral: Negative.        Objective:   Physical Exam Constitutional:      General: He is not in acute distress.    Appearance: He is well-developed. He is not diaphoretic.  HENT:     Head: Normocephalic and atraumatic.     Right Ear: External ear normal.     Left Ear: External ear normal.     Nose: Nose normal.     Mouth/Throat:     Pharynx: No oropharyngeal exudate.  Eyes:     General: No scleral icterus.       Right eye: No discharge.        Left eye: No discharge.     Conjunctiva/sclera: Conjunctivae normal.     Pupils: Pupils are equal, round, and reactive to light.  Neck:     Thyroid: No thyromegaly.     Vascular: No JVD.     Trachea: No tracheal deviation.  Cardiovascular:     Rate and Rhythm: Normal rate and regular rhythm.     Heart sounds: Normal heart sounds. No murmur heard.  No friction rub. No gallop.   Pulmonary:     Effort: Pulmonary effort is normal. No respiratory distress.     Breath sounds: Normal breath sounds. No wheezing or rales.  Chest:     Chest wall: No tenderness.  Abdominal:     General: Bowel sounds are normal. There is no distension.     Palpations: Abdomen is soft. There is no mass.     Tenderness:  There is no abdominal tenderness. There is no guarding or rebound.  Genitourinary:    Penis: Normal. No tenderness.      Testes: Normal.     Rectum: Normal. Guaiac result negative.     Comments: Prostate is moderately enlarged  Musculoskeletal:        General: No tenderness. Normal range of motion.     Cervical back: Neck supple.  Lymphadenopathy:     Cervical: No cervical adenopathy.  Skin:    General: Skin is warm and dry.     Comments: There are widespread areas of erythema, slight edema, and peeling on the ears, neck, trunk, arms, and legs   Neurological:     Mental Status: He is alert and oriented to person, place, and time.     Cranial Nerves: No cranial nerve deficit.     Motor: No abnormal muscle tone.     Coordination: Coordination normal.     Deep Tendon Reflexes: Reflexes are normal and symmetric. Reflexes normal.  Psychiatric:        Behavior: Behavior normal.        Thought Content: Thought content normal.  Judgment: Judgment normal.           Assessment & Plan:  Well exam. We discussed diet and exercise. Set up fasting labs soon. He also has atopic dermatitis which seems to be a reaction to the the OTC prostate supplement he has been taking. He will stop taking this immediately. He is given a shot of DepoMedrol. He can apply Triamcinolone cream to the affected areas. Recheck as needed.  Gershon Crane, MD

## 2019-10-27 ENCOUNTER — Other Ambulatory Visit: Payer: Self-pay

## 2019-10-27 ENCOUNTER — Other Ambulatory Visit (INDEPENDENT_AMBULATORY_CARE_PROVIDER_SITE_OTHER): Payer: Federal, State, Local not specified - PPO

## 2019-10-27 DIAGNOSIS — Z0000 Encounter for general adult medical examination without abnormal findings: Secondary | ICD-10-CM

## 2019-10-28 LAB — CBC WITH DIFFERENTIAL/PLATELET
Absolute Monocytes: 307 cells/uL (ref 200–950)
Basophils Absolute: 38 cells/uL (ref 0–200)
Basophils Relative: 0.9 %
Eosinophils Absolute: 147 cells/uL (ref 15–500)
Eosinophils Relative: 3.5 %
HCT: 42.6 % (ref 38.5–50.0)
Hemoglobin: 14.1 g/dL (ref 13.2–17.1)
Lymphs Abs: 1436 cells/uL (ref 850–3900)
MCH: 30.6 pg (ref 27.0–33.0)
MCHC: 33.1 g/dL (ref 32.0–36.0)
MCV: 92.4 fL (ref 80.0–100.0)
MPV: 10.6 fL (ref 7.5–12.5)
Monocytes Relative: 7.3 %
Neutro Abs: 2272 cells/uL (ref 1500–7800)
Neutrophils Relative %: 54.1 %
Platelets: 193 10*3/uL (ref 140–400)
RBC: 4.61 10*6/uL (ref 4.20–5.80)
RDW: 12.4 % (ref 11.0–15.0)
Total Lymphocyte: 34.2 %
WBC: 4.2 10*3/uL (ref 3.8–10.8)

## 2019-10-28 LAB — PSA: PSA: 2.7 ng/mL (ref <=4.0)

## 2019-10-28 LAB — HEPATIC FUNCTION PANEL
AG Ratio: 1.4 (calc) (ref 1.0–2.5)
ALT: 23 U/L (ref 9–46)
AST: 31 U/L (ref 10–35)
Albumin: 3.7 g/dL (ref 3.6–5.1)
Alkaline phosphatase (APISO): 66 U/L (ref 35–144)
Bilirubin, Direct: 0.2 mg/dL (ref 0.0–0.2)
Globulin: 2.7 g/dL (calc) (ref 1.9–3.7)
Indirect Bilirubin: 0.5 mg/dL (calc) (ref 0.2–1.2)
Total Bilirubin: 0.7 mg/dL (ref 0.2–1.2)
Total Protein: 6.4 g/dL (ref 6.1–8.1)

## 2019-10-28 LAB — BASIC METABOLIC PANEL
BUN: 17 mg/dL (ref 7–25)
CO2: 24 mmol/L (ref 20–32)
Calcium: 8.4 mg/dL — ABNORMAL LOW (ref 8.6–10.3)
Chloride: 109 mmol/L (ref 98–110)
Creat: 1.06 mg/dL (ref 0.70–1.18)
Glucose, Bld: 89 mg/dL (ref 65–99)
Potassium: 4.4 mmol/L (ref 3.5–5.3)
Sodium: 140 mmol/L (ref 135–146)

## 2019-10-28 LAB — LIPID PANEL
Cholesterol: 149 mg/dL (ref <200)
HDL: 54 mg/dL (ref >=40)
LDL Cholesterol (Calc): 83 mg/dL (calc)
Non-HDL Cholesterol (Calc): 95 mg/dL (calc) (ref <130)
Total CHOL/HDL Ratio: 2.8 (calc) (ref <5.0)
Triglycerides: 41 mg/dL (ref <150)

## 2019-10-28 LAB — TSH: TSH: 1.17 mIU/L (ref 0.40–4.50)

## 2019-11-08 DIAGNOSIS — R21 Rash and other nonspecific skin eruption: Secondary | ICD-10-CM

## 2019-11-08 HISTORY — DX: Rash and other nonspecific skin eruption: R21

## 2019-11-28 DIAGNOSIS — N401 Enlarged prostate with lower urinary tract symptoms: Secondary | ICD-10-CM | POA: Diagnosis not present

## 2019-11-28 DIAGNOSIS — R3912 Poor urinary stream: Secondary | ICD-10-CM | POA: Diagnosis not present

## 2019-11-28 DIAGNOSIS — R350 Frequency of micturition: Secondary | ICD-10-CM | POA: Diagnosis not present

## 2019-12-01 ENCOUNTER — Telehealth: Payer: Self-pay | Admitting: Family Medicine

## 2019-12-01 NOTE — Telephone Encounter (Signed)
Patient called wanting his lab results because he stated that no one has contact him with the results. I read in the notes that Lillia Abed called the patient and left a detailed message on his VM.    He is also needing a surgical clearance form completed for [facility].  Please advise

## 2019-12-05 NOTE — Telephone Encounter (Signed)
Patient called, left VM to return the call to the office to discuss lab results. 

## 2019-12-15 ENCOUNTER — Other Ambulatory Visit: Payer: Self-pay | Admitting: Urology

## 2020-01-02 ENCOUNTER — Other Ambulatory Visit: Payer: Self-pay

## 2020-01-02 ENCOUNTER — Encounter: Payer: Self-pay | Admitting: Family Medicine

## 2020-01-02 ENCOUNTER — Ambulatory Visit: Payer: Federal, State, Local not specified - PPO | Admitting: Family Medicine

## 2020-01-02 VITALS — BP 140/80 | HR 88 | Temp 98.7°F | Ht 77.0 in | Wt 202.8 lb

## 2020-01-02 DIAGNOSIS — L209 Atopic dermatitis, unspecified: Secondary | ICD-10-CM | POA: Diagnosis not present

## 2020-01-02 MED ORDER — FLUOCINONIDE EMULSIFIED BASE 0.05 % EX CREA: 1.0000 "application " | TOPICAL_CREAM | Freq: Two times a day (BID) | CUTANEOUS | 5 refills | Status: DC

## 2020-01-02 NOTE — Progress Notes (Signed)
   Subjective:    Patient ID: Brian Bond, male    DOB: 1940-07-08, 79 y.o.   MRN: [id number]  HPI Here for an itchy scaly rash on the neck, trunk, and hands that started about 7 weeks ago. He has been using Triamcinolone cream with little benefit. 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 He wears rubber gloves when he uses them.    Review of Systems  Constitutional: Negative.   Respiratory: Negative.   Cardiovascular: Negative.   Skin: Positive for rash.       Objective:   Physical Exam Constitutional:      Appearance: Normal appearance.  Cardiovascular:     Rate and Rhythm: Normal rate and regular rhythm.     Pulses: Normal pulses.     Heart sounds: Normal heart sounds.  Pulmonary:     Effort: Pulmonary effort is normal.     Breath sounds: Normal breath sounds.  Skin:    Comments: The skin on both hands and wrists is red and scaly. This is present to a lesser degree on the back of his neck  Neurological:     Mental Status: He is alert.           Assessment & Plan:  Atopic dermatitis. He will stop Triamcinolone and try Lidex cream BID. Refer to Dermatology. Gershon Crane, MD

## 2020-01-03 NOTE — Patient Instructions (Addendum)
DUE TO COVID-19 ONLY ONE VISITOR IS ALLOWED TO COME WITH YOU AND STAY IN THE WAITING ROOM ONLY DURING PRE OP AND PROCEDURE DAY OF SURGERY. THE 1 VISITOR  MAY VISIT WITH YOU AFTER SURGERY IN YOUR PRIVATE ROOM DURING VISITING HOURS ONLY!  YOU NEED TO HAVE A COVID 19 TEST ON__10/28_____ @_11 :30______,  THIS TEST MUST BE DONE BEFORE SURGERY,    COVID TESTING SITE [address] [place] ,   IT IS ON THE RIGHT GOING OUT [address] APPROXIMATELY  2 MINUTES PAST [employer] ON THE RIGHT. ONCE YOUR COVID TEST IS COMPLETED,  PLEASE BEGIN THE QUARANTINE INSTRUCTIONS AS OUTLINED IN YOUR HANDOUT.                [id number]    Your procedure is scheduled on: 01/08/20   Report to [facility] Main  Entrance   Report to admitting at  10:10 AM     Call this number if you have problems the morning of surgery [phone]    Remember: Do not eat food or drink liquids :After Midnight.   BRUSH YOUR TEETH MORNING OF SURGERY AND RINSE YOUR MOUTH OUT, NO CHEWING GUM CANDY OR MINTS.     Take these medicines the morning of surgery with A SIP OF WATER: None                                 You may not have any metal on your body including             piercings  Do not wear jewelry,  lotions, powders or deodorant                          Men may shave face and neck.   Do not bring valuables to the hospital. [facility] IS NOT             RESPONSIBLE   FOR VALUABLES.  Contacts, dentures or bridgework may not be worn into surgery.        Special Instructions: N/A              Please read over the following fact sheets you were given: _____________________________________________________________________             [facility] - Preparing for Surgery  Before surgery, you can play an important role.   Because skin is not sterile, your skin needs to be as free of germs as possible.   You can reduce the number of germs on your skin by washing with CHG  (chlorahexidine gluconate) soap before surgery .  CHG is an antiseptic cleaner which kills germs and bonds with the skin to continue killing germs even after washing. Please DO NOT use if you have an allergy to CHG or antibacterial soaps.   If your skin becomes reddened/irritated stop using the CHG and inform your nurse when you arrive at Short Stay.  You may shave your face/neck.  Please follow these instructions carefully:  1.  Shower with CHG Soap the night before surgery and the  morning of Surgery.  2.  If you choose to wash your hair, wash your hair first as usual with your  normal  shampoo.  3.  After you shampoo, rinse your hair and body thoroughly to remove the  shampoo.  4.  Use CHG as you would any other liquid soap.  You can apply chg directly  to the skin and wash                       Gently with a scrungie or clean washcloth.  5.  Apply the CHG Soap to your body ONLY FROM THE NECK DOWN.   Do not use on face/ open                           Wound or open sores. Avoid contact with eyes, ears mouth and genitals (private parts).                       Wash face,  Genitals (private parts) with your normal soap.             6.  Wash thoroughly, paying special attention to the area where your surgery  will be performed.  7.  Thoroughly rinse your body with warm water from the neck down.  8.  DO NOT shower/wash with your normal soap after using and rinsing off  the CHG Soap.             9.  Pat yourself dry with a clean towel.            10.  Wear clean pajamas.            11.  Place clean sheets on your bed the night of your first shower and do not  sleep with pets. Day of Surgery : Do not apply any lotions/deodorants the morning of surgery.  Please wear clean clothes to the hospital/surgery center.  FAILURE TO FOLLOW THESE INSTRUCTIONS MAY RESULT IN THE CANCELLATION OF YOUR SURGERY PATIENT SIGNATURE_________________________________  NURSE  SIGNATURE__________________________________  ________________________________________________________________________

## 2020-01-04 ENCOUNTER — Encounter (HOSPITAL_COMMUNITY): Payer: Self-pay

## 2020-01-04 ENCOUNTER — Other Ambulatory Visit (HOSPITAL_COMMUNITY)
Admission: RE | Admit: 2020-01-04 | Discharge: 2020-01-04 | Disposition: A | Discharge status: Home / Self Care | Payer: Federal, State, Local not specified - PPO | Source: Ambulatory Visit | Attending: Urology | Admitting: Urology

## 2020-01-04 ENCOUNTER — Other Ambulatory Visit: Payer: Self-pay

## 2020-01-04 ENCOUNTER — Encounter (HOSPITAL_COMMUNITY)
Admission: RE | Admit: 2020-01-04 | Discharge: 2020-01-04 | Disposition: A | Discharge status: Home / Self Care | Payer: Federal, State, Local not specified - PPO | Source: Ambulatory Visit | Attending: Urology | Admitting: Urology

## 2020-01-04 DIAGNOSIS — Z20822 Contact with and (suspected) exposure to covid-19: Secondary | ICD-10-CM | POA: Diagnosis not present

## 2020-01-04 DIAGNOSIS — Z01818 Encounter for other preprocedural examination: Secondary | ICD-10-CM | POA: Insufficient documentation

## 2020-01-04 LAB — COMPREHENSIVE METABOLIC PANEL
ALT: 26 U/L (ref 0–44)
AST: 33 U/L (ref 15–41)
Albumin: 3.8 g/dL (ref 3.5–5.0)
Alkaline Phosphatase: 77 U/L (ref 38–126)
Anion gap: 8 (ref 5–15)
BUN: 13 mg/dL (ref 8–23)
CO2: 25 mmol/L (ref 22–32)
Calcium: 8.8 mg/dL — ABNORMAL LOW (ref 8.9–10.3)
Chloride: 109 mmol/L (ref 98–111)
Creatinine, Ser: 1.2 mg/dL (ref 0.61–1.24)
GFR, Estimated: >60 mL/min (ref >60)
Glucose, Bld: 137 mg/dL — ABNORMAL HIGH (ref 70–99)
Potassium: 4.2 mmol/L (ref 3.5–5.1)
Sodium: 142 mmol/L (ref 135–145)
Total Bilirubin: 1 mg/dL (ref 0.3–1.2)
Total Protein: 7.4 g/dL (ref 6.5–8.1)

## 2020-01-04 LAB — CBC
HCT: 48.4 % (ref 39.0–52.0)
Hemoglobin: 15.9 g/dL (ref 13.0–17.0)
MCH: 30.6 pg (ref 26.0–34.0)
MCHC: 32.9 g/dL (ref 30.0–36.0)
MCV: 93.3 fL (ref 80.0–100.0)
Platelets: 210 10*3/uL (ref 150–400)
RBC: 5.19 MIL/uL (ref 4.22–5.81)
RDW: 12.5 % (ref 11.5–15.5)
WBC: 6 10*3/uL (ref 4.0–10.5)
nRBC: 0 % (ref 0.0–0.2)

## 2020-01-04 LAB — SARS CORONAVIRUS 2 (TAT 6-24 HRS): SARS Coronavirus 2: NEGATIVE

## 2020-01-04 NOTE — Progress Notes (Signed)
COVID Vaccine Completed:yes Date COVID Vaccine completed: Pt can't remember and can't find his card COVID vaccine manufacturer: Pfizer   Pt thinks it might be but not sure.  PCP - Dr. Claris Che Cardiologist - no  Chest x-ray -no  EKG - no Stress Test - no ECHO - no Cardiac Cath - no Pacemaker/ICD device last checked:NA  Sleep Study - no CPAP -   Fasting Blood Sugar - NA Checks Blood Sugar _____ times a day  Blood Thinner Instructions:NA Aspirin Instructions: Last Dose:  Anesthesia review:   Patient denies shortness of breath, fever, cough and chest pain at PAT appointment yes   Patient verbalized understanding of instructions that were given to them at the PAT appointment. Patient was also instructed that they will need to review over the PAT instructions again at home before surgery. Yes  Pt reports no SOB climbing stairs, doing work or with ADLs. He has a history of chronic narcotic use and ETOH but only has a few beers a week.

## 2020-01-08 ENCOUNTER — Encounter (HOSPITAL_COMMUNITY)
Admission: RE | Disposition: A | Discharge status: Home / Self Care | Payer: Self-pay | Source: Home / Self Care | Attending: Urology

## 2020-01-08 ENCOUNTER — Encounter (HOSPITAL_COMMUNITY): Payer: Self-pay | Admitting: Urology

## 2020-01-08 ENCOUNTER — Ambulatory Visit (HOSPITAL_COMMUNITY): Payer: Federal, State, Local not specified - PPO | Admitting: Anesthesiology

## 2020-01-08 ENCOUNTER — Other Ambulatory Visit: Payer: Self-pay

## 2020-01-08 ENCOUNTER — Ambulatory Visit (HOSPITAL_COMMUNITY)
Admission: RE | Admit: 2020-01-08 | Discharge: 2020-01-09 | Disposition: A | Discharge status: Home / Self Care | Payer: Federal, State, Local not specified - PPO | Attending: Urology | Admitting: Urology

## 2020-01-08 ENCOUNTER — Telehealth (HOSPITAL_COMMUNITY): Payer: Self-pay | Admitting: *Deleted

## 2020-01-08 DIAGNOSIS — N401 Enlarged prostate with lower urinary tract symptoms: Secondary | ICD-10-CM | POA: Diagnosis not present

## 2020-01-08 DIAGNOSIS — N400 Enlarged prostate without lower urinary tract symptoms: Secondary | ICD-10-CM | POA: Diagnosis not present

## 2020-01-08 DIAGNOSIS — N320 Bladder-neck obstruction: Secondary | ICD-10-CM | POA: Insufficient documentation

## 2020-01-08 DIAGNOSIS — R350 Frequency of micturition: Secondary | ICD-10-CM | POA: Insufficient documentation

## 2020-01-08 DIAGNOSIS — G4700 Insomnia, unspecified: Secondary | ICD-10-CM | POA: Diagnosis not present

## 2020-01-08 DIAGNOSIS — R3912 Poor urinary stream: Secondary | ICD-10-CM | POA: Insufficient documentation

## 2020-01-08 DIAGNOSIS — N138 Other obstructive and reflux uropathy: Secondary | ICD-10-CM | POA: Diagnosis not present

## 2020-01-08 HISTORY — PX: PROSTATE SURGERY: SHX751

## 2020-01-08 LAB — CREATININE, SERUM
Creatinine, Ser: 1.08 mg/dL (ref 0.61–1.24)
GFR, Estimated: >60 mL/min (ref >60)

## 2020-01-08 LAB — CBC
HCT: 46 % (ref 39.0–52.0)
Hemoglobin: 15 g/dL (ref 13.0–17.0)
MCH: 30.1 pg (ref 26.0–34.0)
MCHC: 32.6 g/dL (ref 30.0–36.0)
MCV: 92.4 fL (ref 80.0–100.0)
Platelets: 191 10*3/uL (ref 150–400)
RBC: 4.98 MIL/uL (ref 4.22–5.81)
RDW: 12.4 % (ref 11.5–15.5)
WBC: 5.3 10*3/uL (ref 4.0–10.5)
nRBC: 0 % (ref 0.0–0.2)

## 2020-01-08 SURGERY — Holmium Laser Enucleation of the Prostate with Morcellation
Anesthesia: General

## 2020-01-08 MED ORDER — DEXAMETHASONE SODIUM PHOSPHATE 10 MG/ML IJ SOLN
INTRAMUSCULAR | Status: AC
  Filled 2020-01-08: qty 1

## 2020-01-08 MED ORDER — PROMETHAZINE HCL 25 MG/ML IJ SOLN: 6.2500 mg | INTRAMUSCULAR | Status: DC | PRN

## 2020-01-08 MED ORDER — DEXAMETHASONE SODIUM PHOSPHATE 10 MG/ML IJ SOLN
INTRAMUSCULAR | Status: DC | PRN
  Administered 2020-01-08: 10 mg via INTRAVENOUS

## 2020-01-08 MED ORDER — OXYCODONE-ACETAMINOPHEN 5-325 MG PO TABS
1.0000 | ORAL_TABLET | ORAL | Status: DC | PRN
  Administered 2020-01-09: 1 via ORAL
  Filled 2020-01-08: qty 1

## 2020-01-08 MED ORDER — KCL IN DEXTROSE-NACL 20-5-0.45 MEQ/L-%-% IV SOLN
INTRAVENOUS | Status: DC
  Filled 2020-01-08 (×2): qty 1000

## 2020-01-08 MED ORDER — FENTANYL CITRATE (PF) 100 MCG/2ML IJ SOLN
INTRAMUSCULAR | Status: AC
  Filled 2020-01-08: qty 2

## 2020-01-08 MED ORDER — SODIUM CHLORIDE 0.9 % IR SOLN
3000.0000 mL | Status: DC
  Administered 2020-01-08 – 2020-01-09 (×3): 3000 mL

## 2020-01-08 MED ORDER — PROPOFOL 10 MG/ML IV BOLUS
INTRAVENOUS | Status: DC | PRN
  Administered 2020-01-08: 30 mg via INTRAVENOUS
  Administered 2020-01-08: 50 mg via INTRAVENOUS
  Administered 2020-01-08: 200 mg via INTRAVENOUS

## 2020-01-08 MED ORDER — LIDOCAINE 2% (20 MG/ML) 5 ML SYRINGE
INTRAMUSCULAR | Status: AC
  Filled 2020-01-08: qty 5

## 2020-01-08 MED ORDER — CEPHALEXIN 500 MG PO CAPS: 500.0000 mg | ORAL_CAPSULE | Freq: Two times a day (BID) | ORAL | 0 refills | Status: AC

## 2020-01-08 MED ORDER — ACETAMINOPHEN 500 MG PO TABS
1000.0000 mg | ORAL_TABLET | Freq: Once | ORAL | Status: AC
  Administered 2020-01-08: 1000 mg via ORAL
  Filled 2020-01-08: qty 2

## 2020-01-08 MED ORDER — ORAL CARE MOUTH RINSE: 15.0000 mL | Freq: Once | OROMUCOSAL | Status: AC

## 2020-01-08 MED ORDER — MEPERIDINE HCL 50 MG/ML IJ SOLN: 6.2500 mg | INTRAMUSCULAR | Status: DC | PRN

## 2020-01-08 MED ORDER — HYDROMORPHONE HCL 1 MG/ML IJ SOLN: 0.2500 mg | INTRAMUSCULAR | Status: DC | PRN

## 2020-01-08 MED ORDER — TEMAZEPAM 30 MG PO CAPS
30.0000 mg | ORAL_CAPSULE | Freq: Every evening | ORAL | Status: DC | PRN
  Administered 2020-01-09: 30 mg via ORAL
  Filled 2020-01-08: qty 1

## 2020-01-08 MED ORDER — ROCURONIUM BROMIDE 10 MG/ML (PF) SYRINGE
PREFILLED_SYRINGE | INTRAVENOUS | Status: AC
  Filled 2020-01-08: qty 10

## 2020-01-08 MED ORDER — FENTANYL CITRATE (PF) 100 MCG/2ML IJ SOLN
INTRAMUSCULAR | Status: DC | PRN
  Administered 2020-01-08 (×6): 50 ug via INTRAVENOUS

## 2020-01-08 MED ORDER — PHENYLEPHRINE 40 MCG/ML (10ML) SYRINGE FOR IV PUSH (FOR BLOOD PRESSURE SUPPORT)
PREFILLED_SYRINGE | INTRAVENOUS | Status: AC
  Filled 2020-01-08: qty 10

## 2020-01-08 MED ORDER — CHLORHEXIDINE GLUCONATE 0.12 % MT SOLN
15.0000 mL | Freq: Once | OROMUCOSAL | Status: AC
  Administered 2020-01-08: 15 mL via OROMUCOSAL

## 2020-01-08 MED ORDER — BELLADONNA ALKALOIDS-OPIUM 16.2-60 MG RE SUPP: 1.0000 | Freq: Four times a day (QID) | RECTAL | Status: DC | PRN

## 2020-01-08 MED ORDER — DOCUSATE SODIUM 100 MG PO CAPS: 100.0000 mg | ORAL_CAPSULE | Freq: Every day | ORAL | 0 refills | Status: DC | PRN

## 2020-01-08 MED ORDER — MIDAZOLAM HCL 2 MG/2ML IJ SOLN: 0.5000 mg | Freq: Once | INTRAMUSCULAR | Status: DC | PRN

## 2020-01-08 MED ORDER — DIPHENHYDRAMINE HCL 50 MG/ML IJ SOLN: 12.5000 mg | Freq: Four times a day (QID) | INTRAMUSCULAR | Status: DC | PRN

## 2020-01-08 MED ORDER — OXYCODONE HCL 5 MG/5ML PO SOLN: 5.0000 mg | Freq: Once | ORAL | Status: DC | PRN

## 2020-01-08 MED ORDER — LACTATED RINGERS IV SOLN: INTRAVENOUS | Status: DC

## 2020-01-08 MED ORDER — HEPARIN SODIUM (PORCINE) 5000 UNIT/ML IJ SOLN
5000.0000 [IU] | Freq: Three times a day (TID) | INTRAMUSCULAR | Status: DC
  Administered 2020-01-08 – 2020-01-09 (×2): 5000 [IU] via SUBCUTANEOUS
  Filled 2020-01-08 (×2): qty 1

## 2020-01-08 MED ORDER — OXYCODONE HCL 5 MG PO TABS: 5.0000 mg | ORAL_TABLET | Freq: Once | ORAL | Status: DC | PRN

## 2020-01-08 MED ORDER — 0.9 % SODIUM CHLORIDE (POUR BTL) OPTIME
TOPICAL | Status: DC | PRN
  Administered 2020-01-08: 1000 mL

## 2020-01-08 MED ORDER — ONDANSETRON HCL 4 MG/2ML IJ SOLN
INTRAMUSCULAR | Status: DC | PRN
  Administered 2020-01-08: 4 mg via INTRAVENOUS

## 2020-01-08 MED ORDER — GLYCOPYRROLATE PF 0.2 MG/ML IJ SOSY
PREFILLED_SYRINGE | INTRAMUSCULAR | Status: DC | PRN
  Administered 2020-01-08: .2 mg via INTRAVENOUS

## 2020-01-08 MED ORDER — ACETAMINOPHEN 325 MG PO TABS: 650.0000 mg | ORAL_TABLET | ORAL | Status: DC | PRN

## 2020-01-08 MED ORDER — LIDOCAINE 2% (20 MG/ML) 5 ML SYRINGE
INTRAMUSCULAR | Status: DC | PRN
  Administered 2020-01-08: 20 mg via INTRAVENOUS

## 2020-01-08 MED ORDER — DIPHENHYDRAMINE HCL 12.5 MG/5ML PO ELIX: 12.5000 mg | ORAL_SOLUTION | Freq: Four times a day (QID) | ORAL | Status: DC | PRN

## 2020-01-08 MED ORDER — OXYCODONE-ACETAMINOPHEN 5-325 MG PO TABS
1.0000 | ORAL_TABLET | ORAL | 0 refills | Status: DC | PRN
Start: 2020-01-08 — End: 2020-02-27

## 2020-01-08 MED ORDER — SODIUM CHLORIDE 0.9 % IR SOLN
Status: DC | PRN
  Administered 2020-01-08: 42000 mL
  Administered 2020-01-08: 9000 mL
  Administered 2020-01-08: 6000 mL

## 2020-01-08 MED ORDER — ONDANSETRON HCL 4 MG/2ML IJ SOLN
INTRAMUSCULAR | Status: AC
  Filled 2020-01-08: qty 2

## 2020-01-08 MED ORDER — OXYBUTYNIN CHLORIDE ER 5 MG PO TB24
10.0000 mg | ORAL_TABLET | Freq: Every day | ORAL | Status: DC
  Administered 2020-01-08 – 2020-01-09 (×2): 10 mg via ORAL
  Filled 2020-01-08 (×2): qty 2

## 2020-01-08 MED ORDER — CEFAZOLIN SODIUM-DEXTROSE 2-4 GM/100ML-% IV SOLN
2.0000 g | Freq: Once | INTRAVENOUS | Status: AC
  Administered 2020-01-08: 2 g via INTRAVENOUS
  Filled 2020-01-08: qty 100

## 2020-01-08 MED ORDER — SUGAMMADEX SODIUM 500 MG/5ML IV SOLN
INTRAVENOUS | Status: DC | PRN
  Administered 2020-01-08: 370 mg via INTRAVENOUS

## 2020-01-08 MED ORDER — ONDANSETRON HCL 4 MG/2ML IJ SOLN: 4.0000 mg | INTRAMUSCULAR | Status: DC | PRN

## 2020-01-08 MED ORDER — DOCUSATE SODIUM 100 MG PO CAPS
100.0000 mg | ORAL_CAPSULE | Freq: Two times a day (BID) | ORAL | Status: DC
  Administered 2020-01-08: 100 mg via ORAL
  Filled 2020-01-08 (×2): qty 1

## 2020-01-08 MED ORDER — PNEUMOCOCCAL VAC POLYVALENT 25 MCG/0.5ML IJ INJ
0.5000 mL | INJECTION | INTRAMUSCULAR | Status: DC
  Filled 2020-01-08: qty 0.5

## 2020-01-08 MED ORDER — ROCURONIUM BROMIDE 10 MG/ML (PF) SYRINGE
PREFILLED_SYRINGE | INTRAVENOUS | Status: DC | PRN
  Administered 2020-01-08: 20 mg via INTRAVENOUS
  Administered 2020-01-08: 70 mg via INTRAVENOUS
  Administered 2020-01-08: 10 mg via INTRAVENOUS
  Administered 2020-01-08 (×3): 20 mg via INTRAVENOUS

## 2020-01-08 SURGICAL SUPPLY — 37 items
ADAPTER IRRIG TUBE 2 SPIKE SOL (ADAPTER) ×4 IMPLANT
ADPR TBG 2 SPK PMP STRL ASCP (ADAPTER) ×2
BAG DRN LRG CPC RND TRDRP CNTR (MISCELLANEOUS) ×1
BAG URO DRAIN 4000ML (MISCELLANEOUS) ×2 IMPLANT
CATH FOLEY 3WAY 30CC 24FR (CATHETERS) ×2
CATH URETL 5X70 OPEN END (CATHETERS) ×2 IMPLANT
CATH URTH STD 24FR FL 3W 2 (CATHETERS) ×1 IMPLANT
CONTAINER COLLECT MORCELLATR (MISCELLANEOUS) ×1 IMPLANT
DRAPE UTILITY 15X26 TOWEL STRL (DRAPES) IMPLANT
ELECT BIVAP BIPO 22/24 DONUT (ELECTROSURGICAL)
ELECTRD BIVAP BIPO 22/24 DONUT (ELECTROSURGICAL) IMPLANT
FIBER LASER MOSES 550 DFL (Laser) ×2 IMPLANT
FILTER OVERFLOW MORCELLATOR (FILTER) ×1 IMPLANT
GLOVE SS BIOGEL STRL SZ 7 (GLOVE) ×1 IMPLANT
GLOVE SUPERSENSE BIOGEL SZ 7 (GLOVE) ×1
GOWN STRL REUS W/ TWL LRG LVL3 (GOWN DISPOSABLE) ×1 IMPLANT
GOWN STRL REUS W/ TWL XL LVL3 (GOWN DISPOSABLE) ×1 IMPLANT
GOWN STRL REUS W/TWL LRG LVL3 (GOWN DISPOSABLE) ×2
GOWN STRL REUS W/TWL XL LVL3 (GOWN DISPOSABLE) ×2
HOLDER FOLEY CATH W/STRAP (MISCELLANEOUS) ×2 IMPLANT
KIT TURNOVER KIT A (KITS) ×2 IMPLANT
LOOP CUT BIPOLAR 24F LRG (ELECTROSURGICAL) ×1 IMPLANT
MBRN O SEALING YLW 17 FOR INST (MISCELLANEOUS) ×2
MEMBRANE SLNG YLW 17 FOR INST (MISCELLANEOUS) ×1 IMPLANT
MORCELLATOR COLLECT CONTAINER (MISCELLANEOUS) ×2
MORCELLATOR OVERFLOW FILTER (FILTER) ×2
MORCELLATOR ROTATION 4.75 335 (MISCELLANEOUS) ×2 IMPLANT
NS IRRIG 1000ML POUR BTL (IV SOLUTION) ×8 IMPLANT
PACK CYSTO (CUSTOM PROCEDURE TRAY) ×2 IMPLANT
SET CYSTO W/LG BORE CLAMP LF (SET/KITS/TRAYS/PACK) ×2 IMPLANT
SET IRRIG Y TYPE TUR BLADDER L (SET/KITS/TRAYS/PACK) ×2 IMPLANT
SLEEVE SURGEON STRL (DRAPES) ×4 IMPLANT
SURGILUBE 2OZ TUBE FLIPTOP (MISCELLANEOUS) ×2 IMPLANT
SYR TOOMEY IRRIG 70ML (MISCELLANEOUS) ×2
SYRINGE TOOMEY IRRIG 70ML (MISCELLANEOUS) ×1 IMPLANT
TUBE PUMP MORCELLATOR PIRANHA (TUBING) ×1 IMPLANT
TUBING UROLOGY SET (TUBING) ×2 IMPLANT

## 2020-01-08 NOTE — H&P (Signed)
Arley Phenixonald E. Chuck  MRN: [id number]  DOB: 1940-12-16, 79 year old Male  SSN: [id number]   PRIMARY CARE:  Jeannett SeniorStephen A. Clent RidgesFry, MD  REFERRING:  Tera MaterStephen A. Clent RidgesFry, MD  PROVIDER:  Jettie PaganMatthew Vika Buske, M.D.  LOCATION:  [facility] [phone]     --------------------------------------------------------------------------------   CC/HPI: Mr. Brian Bond is a 79 year old male seen in consultation today for BPH.   He has a long history of BPH with lower urinary tract symptoms and recently underwent diagnostic work-up with Dr. Annabell HowellsWrenn which included a prostate ultrasound for size and a cystoscopy. Ultrasound 10/09/2019 demonstrated a 108 g prostate. Cystoscopy demonstrated an enlarged median lobe. His IPSS score is severe at 24. He is previously tried tamsulosin as well as an over-the-counter prostate supplement. His last PSA was 3.23 in 04/2019. He has frequency every 2 hours as well as nocturia x3. He has some transient urinary urgency as well as rare UUI. He has some intermittency as well as a sensation of incomplete emptying. He states he has an adequate stream however he has to strain at times. He denies a history of recurrent urinary tract infections. He denies a history of urolithiasis. He does have an increased caffeine intake with coffee but does not drink much tea or diet sodas.   Patient currently denies fever, chills, sweats, nausea, vomiting, abdominal or flank pain, gross hematuria or dysuria.      ALLERGIES: No Allergies    MEDICATIONS: Restoril     GU PSH: Complex Uroflow - 08/09/2019       PSH Notes: Complex Repair Of Wound Thighs   NON-GU PSH: Leg/ankle Surgery Procedure     GU PMH: BPH w/LUTS, He has moderate to severe LUTS and has a 108ml prostate with a middle lobe. He is not a good candidate for a Urolift. I discussed Rezum, TURP and HOLEP. He is doing ok on the supplement and will stick with that for now and f/u in 3 months. - 10/09/2019, He has severe LUTS with a reduced stream that  is no longer responding to tamsulosin. I am going to have him return for a prostate US for volume assessment and cystoscopy to evaluate his options for therapy. , - 08/09/2019 Elevated PSA, His PSA is minimally elevated at 3.23 and stable since 2008. His exam is benign. He doesn't need a biopsy. - 08/09/2019 Urinary Frequency - 08/09/2019 Weak Urinary Stream - 08/09/2019      PMH Notes:  1898-03-09 00:00:00 - Note: Normal Routine History And Physical Adult  2006-04-20 08:59:47 - Note: Arthritis  Back Pain   NON-GU PMH: Personal history of other diseases of the digestive system, History of esophageal reflux - 2014 Personal history of other diseases of the nervous system and sense organs, History of sleep apnea - 2014 Sleep Apnea    FAMILY HISTORY: 2 sons - Other Cancer - Sister Cardiac Failure - Runs In Family   SOCIAL HISTORY: Marital Status: Widowed Preferred Language: English Current Smoking Status: Patient has never smoked.   Tobacco Use Assessment Completed: Used Tobacco in last 30 days? Drinks 2 drinks per day.  Does not use drugs. Drinks 2 caffeinated drinks per day. 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     Notes: Tobacco Use, Caffeine Use, Occupation:, Marital History - Widowed, Alcohol Use   REVIEW OF SYSTEMS:    GU Review Male:   Patient denies frequent urination, hard to postpone urination, burning/ pain with urination, get up at night to urinate, leakage of urine, stream starts and stops,  trouble starting your stream, have to strain to urinate , erection problems, and penile pain.  Gastrointestinal (Upper):   Patient denies nausea, vomiting, and indigestion/ heartburn.  Gastrointestinal (Lower):   Patient denies diarrhea and constipation.  Constitutional:   Patient denies fever, night sweats, weight loss, and fatigue.  Skin:   Patient denies skin rash/ lesion and itching.  Eyes:   Patient denies blurred vision and double vision.  Ears/ Nose/ Throat:   Patient denies sore  throat and sinus problems.  Hematologic/Lymphatic:   Patient denies swollen glands and easy bruising.  Cardiovascular:   Patient denies leg swelling and chest pains.  Respiratory:   Patient denies cough and shortness of breath.  Endocrine:   Patient denies excessive thirst.  Musculoskeletal:   Patient denies back pain and joint pain.  Neurological:   Patient denies headaches and dizziness.  Psychologic:   Patient denies depression and anxiety.   VITAL SIGNS:      11/28/2019 03:39 PM  Weight 200 lb / 90.72 kg  Height 78 in / 198.12 cm  BP 149/75 mmHg  Pulse 57 /min  BMI 23.1 kg/m   GU PHYSICAL EXAMINATION:    Anus and Perineum: No hemorrhoids. No anal stenosis. No rectal fissure, no anal fissure. No edema, no dimple, no perineal tenderness, no anal tenderness.  Scrotum: No lesions. No edema. No cysts. No warts.  Epididymides: Right: no spermatocele, no masses, no cysts, no tenderness, no induration, no enlargement. Left: no spermatocele, no masses, no cysts, no tenderness, no induration, no enlargement.  Testes: No tenderness, no swelling, no enlargement left testes. No tenderness, no swelling, no enlargement right testes. Normal location left testes. Normal location right testes. No mass, no cyst, no varicocele, no hydrocele left testes. No mass, no cyst, no varicocele, no hydrocele right testes.  Urethral Meatus: Normal size. No lesion, no wart, no discharge, no polyp. Normal location.  Penis: Circumcised, no warts, no cracks. No dorsal Peyronie's plaques, no left corporal Peyronie's plaques, no right corporal Peyronie's plaques, no scarring, no warts. No balanitis, no meatal stenosis.  Prostate: 80 grams, anodular  Seminal Vesicles: Nonpalpable.  Sphincter Tone: Normal sphincter. No rectal tenderness. No rectal mass.    MULTI-SYSTEM PHYSICAL EXAMINATION:    Constitutional: Well-nourished. No physical deformities. Normally developed. Good grooming.  Respiratory: No labored breathing,  no use of accessory muscles.   Cardiovascular: Normal temperature, normal extremity pulses, no swelling, no varicosities.  Gastrointestinal: No mass, no tenderness, no rigidity, non obese abdomen.     Complexity of Data:  Source Of History:  Patient, Healthcare Provider, Medical Record Summary  Lab Test Review:   PSA  Records Review:   AUA Symptom Score, Previous Doctor Records, Previous Patient Records, IIEF Score  Urine Test Review:   Urinalysis  Urodynamics Review:   Review Bladder Scan   04/19/19 10/11/18 05/14/06  PSA  Total PSA 3.23 ng/dl 3.87 ng/dl 5.64     PROCEDURES:         PVR Ultrasound - 33295  Scanned Volume: 86 cc         Urinalysis Dipstick Dipstick Cont'd  Color: Yellow Bilirubin: Neg mg/dL  Appearance: Clear Ketones: Neg mg/dL  Specific Gravity: 1.884 Blood: Neg ery/uL  pH: 5.5 Protein: Neg mg/dL  Glucose: Neg mg/dL Urobilinogen: 0.2 mg/dL    Nitrites: Neg    Leukocyte Esterase: Neg leu/uL    ASSESSMENT:      ICD-10 Details  1 GU:   BPH w/LUTS - N40.1   2  Urinary Frequency - R35.0   3   Weak Urinary Stream - R39.12    PLAN:            Medications Stop Meds: Percocet  Discontinue: 11/28/2019  - Reason: patient stopped           Document Letter(s):  Created for Patient: Clinical Summary         Notes:   1. BPH with LUTS: Prostate size 108 g on TRUS 10/09/2019. Cystoscopy with enlarged median lobe. PVR 46ml. Uroflow in 08/2019 with peak flow rate of 59ml/sec.   We discussed the options for management of Benign Prostatic Hyperplasia, including watchful waiting, over the counter supplements, medical therapy, minimally invasive therapies, laser therapy and transurethral resection/coagulative therapies. The risks and benefits of each option were discussed. We dicsussed Holmium Laser Enucleation of the Prostate (HoLEP). The risks include but are not limited to frequency, urgency, dysuria, hematuria, retrograde ejaculation and incontinence as well as  anesthesia risks.  After reviewing available options, pt elects to undergo HoLEP. Will schedule. Needs medical clearance prior.        Next Appointment:      Next Appointment: 11/28/2019 03:30 PM    Appointment Type: Office Visit Established Patient    Location: [facility] [phone]    Provider: Jettie Pagan, M.D.    Reason for Visit: per Dr. Annabell Howells to discuss HOLEP      Signed by Jettie Pagan, M.D. on 11/28/19 at 5:20 PM (EDT)  Urology Preoperative H&P   Chief Complaint: BPH  History of Present Illness: Brian Bond is a 79 y.o. male with BPH here for HoLEP. Denies fevers, chills, nausea, emesis.   Past Medical History:  Diagnosis Date  . Bell's palsy 2014  . BPH (benign prostatic hyperplasia)   . Diverticula, colon   . Edema   . Insomnia   . Low back pain   . Osteoarthritis     Past Surgical History:  Procedure Laterality Date  . broken leg     right leg  . COLONOSCOPY  05-09-13   per Dr. Russella Dar, clear, no repeats needed   . FRACTURE SURGERY Right 1974    Allergies: No Known Allergies  Family History  Problem Relation Age of Onset  . Breast cancer Sister   . Hypertension Other        family hx    Social History:  reports that he quit smoking about 28 years ago. His smoking use included cigarettes. He has a 2.50 pack-year smoking history. He has never used smokeless tobacco. He reports current alcohol use of about 4.0 standard drinks of alcohol per week. He reports that he does not use drugs.  ROS: A complete review of systems was performed.  All systems are negative except for pertinent findings as noted.  Physical Exam:  Vital signs in last 24 hours:   Constitutional:  Alert and oriented, No acute distress Cardiovascular: Regular rate and rhythm Respiratory: Normal respiratory effort, Lungs clear bilaterally GI: Abdomen is soft, nontender, nondistended, no abdominal masses GU: No CVA tenderness Lymphatic: No  lymphadenopathy Neurologic: Grossly intact, no focal deficits Psychiatric: Normal mood and affect  Laboratory Data:  No results for input(s): WBC, HGB, HCT, PLT in the last 72 hours.  No results for input(s): NA, K, CL, GLUCOSE, BUN, CALCIUM, CREATININE in the last 72 hours.  Invalid input(s): CO3   No results found for this or any previous visit (from the past 24 hour(s)). Recent Results (from the  past 240 hour(s))  SARS CORONAVIRUS 2 (TAT 6-24 HRS) Nasopharyngeal Nasopharyngeal Swab     Status: None   Collection Time: 01/04/20 11:36 AM   Specimen: Nasopharyngeal Swab  Result Value Ref Range Status   SARS Coronavirus 2 NEGATIVE NEGATIVE Final    Comment: (NOTE) SARS-CoV-2 target nucleic acids are NOT DETECTED.  The SARS-CoV-2 RNA is generally detectable in upper and lower respiratory specimens during the acute phase of infection. Negative results do not preclude SARS-CoV-2 infection, do not rule out co-infections with other pathogens, and should not be used as the sole basis for treatment or other patient management decisions. Negative results must be combined with clinical observations, patient history, and epidemiological information. The expected result is Negative.  Fact Sheet for Patients: HairSlick.no  Fact Sheet for Healthcare Providers: quierodirigir.com  This test is not yet approved or cleared by the [place] FDA and  has been authorized for detection and/or diagnosis of SARS-CoV-2 by FDA under an Emergency Use Authorization (EUA). This EUA will remain  in effect (meaning this test can be used) for the duration of the COVID-19 declaration under Se ction 564(b)(1) of the Act, 21 U.S.C. section 360bbb-3(b)(1), unless the authorization is terminated or revoked sooner.  Performed at [facility], [address] [place], [place] [address]     Renal Function: Recent Labs    01/04/20 1118   CREATININE 1.20   Estimated Creatinine Clearance: 65.6 mL/min (by C-G formula based on SCr of 1.2 mg/dL).  Radiologic Imaging: No results found.  I independently reviewed the above imaging studies.  Assessment and Plan Brian Bond is a 79 y.o. male with BPH here for HoLEP. Ok to proceed.  We discussed the options for management of Benign Prostatic Hyperplasia, including watchful waiting, over the counter supplements, medical therapy, minimally invasive therapies, laser therapy and transurethral resection/coagulative therapies. The risks and benefits of each option were discussed. We dicsussed Holmium Laser Enucleation of the Prostate (HoLEP). The risks include but are not limited to frequency, urgency, dysuria, hematuria, retrograde ejaculation and incontinence as well as anesthesia risks.    Matt R. Barney Gertsch MD 01/08/2020, 10:06 AM  [facility] Pager: [phone]

## 2020-01-08 NOTE — Anesthesia Preprocedure Evaluation (Addendum)
Anesthesia Evaluation  Patient identified by MRN, date of birth, ID band Patient awake    Reviewed: Allergy & Precautions, NPO status , Patient's Chart, lab work & pertinent test results  History of Anesthesia Complications Negative for: history of anesthetic complications  Airway Mallampati: I  TM Distance: >3 FB Neck ROM: Full    Dental  (+) Edentulous Upper, Edentulous Lower   Pulmonary former smoker,  01/04/2020 SARS coronavirus NEG   breath sounds clear to auscultation       Cardiovascular (-) anginanegative cardio ROS   Rhythm:Regular Rate:Normal     Neuro/Psych negative neurological ROS     GI/Hepatic negative GI ROS, Neg liver ROS,   Endo/Other  negative endocrine ROS  Renal/GU negative Renal ROS   BPH    Musculoskeletal   Abdominal   Peds  Hematology negative hematology ROS (+)   Anesthesia Other Findings   Reproductive/Obstetrics                            Anesthesia Physical Anesthesia Plan  ASA: II  Anesthesia Plan: General   Post-op Pain Management:    Induction: Intravenous  PONV Risk Score and Plan: 2 and Ondansetron and Dexamethasone  Airway Management Planned: Oral ETT  Additional Equipment: None  Intra-op Plan:   Post-operative Plan: Extubation in OR  Informed Consent: I have reviewed the patients History and Physical, chart, labs and discussed the procedure including the risks, benefits and alternatives for the proposed anesthesia with the patient or authorized representative who has indicated his/her understanding and acceptance.     Dental advisory given  Plan Discussed with: CRNA and Surgeon  Anesthesia Plan Comments:        Anesthesia Quick Evaluation

## 2020-01-08 NOTE — Transfer of Care (Signed)
Immediate Anesthesia Transfer of Care Note  Patient: Brian Bond  Procedure(s) Performed: Holmium Laser Enucleation of the Prostate with Morcellation, TURP (N/A )  Patient Location: PACU  Anesthesia Type:General  Level of Consciousness: drowsy, patient cooperative and responds to stimulation  Airway & Oxygen Therapy: Patient Spontanous Breathing and Patient connected to face mask oxygen  Post-op Assessment: Report given to RN, Post -op Vital signs reviewed and stable and Patient moving all extremities  Post vital signs: Reviewed and stable  Last Vitals:  Vitals Value Taken Time  BP 128/83 01/08/20 1554  Temp    Pulse 83 01/08/20 1558  Resp 17 01/08/20 1558  SpO2 100 % 01/08/20 1558  Vitals shown include unvalidated device data.  Last Pain:  Vitals:   01/08/20 1100  TempSrc:   PainSc: 0-No pain      Patients Stated Pain Goal: 3 (01/08/20 1100)  Complications: No complications documented.

## 2020-01-08 NOTE — Op Note (Signed)
Operative Note  Preoperative diagnosis:  1.  Bladder outlet obstruction. 2.  Benign prostatic hyperplasia.  Postoperative diagnosis: 1.  Bladder outlet obstruction. 2.  Benign prostatic hyperplasia.  Procedure(s): 1.  Cystoscopy 2.  Holmium laser enucleation of the prostate with tissue morcellation  Surgeon: Jettie Pagan, MD  Assistants:  None  Anesthesia:  General  Complications:  None  EBL:  Minimal  Specimens: 1. Prostate chips * No specimens in log *  Drains/Catheters: 1.  24-French 3-way Foley catheter  Intraoperative findings:   1.  No bladder lesions. 2.  Ureteral orifices in orthotopic position. 3.  Plus +4 trabeculation with a few diverticula  Indication:  Brian Bond is a 79 y.o. male with a history of bladder outlet obstruction and benign prostatic hyperplasia.  Risks, benefits and alternatives were explained and the patient decided to proceed.  Description of procedure: The indications, alternatives, benefits and risks were discussed with the patient and informed consent was obtained.  The patient was brought onto the operating room table, positioned supine and secured with a safety strap.  Pneumatic compression devices were placed on the lower extremities.  After administration of intravenous antibiotics and general anesthesia, the patient was repositioned in the dorsal lithotomy position and all pressure points were carefully padded.  The genitalia were prepped and draped in the standard sterile manner.  Timeout was completed, verifying the correct patient, surgical procedure, and positioning prior to beginning the procedure.  Isotonic normal saline was used for irrigation.  The patient's urethra was calibrated to 30 [place] with sequential [employer] sounds.  Next, a 26 Frenchcontinuous-flow resectoscope was inserted into the patient's bladder using the visual obturator.  This was then exchanged for the laser bridge.  On cystoscopic evaluation, there were no  tumors, stones or foreign bodies or diverticula present.  The bladder wall appeared trabeculated with evidence of several small diverticula.  Both orifices were in the normal anatomic position with clear urinary reflux noted bilaterally.  The location of the ureteral orifices and prostatic configuration was again confirmed.  Using the 550 m holmium Moses laser fiber, we first made an incision starting above the verumontanum taking this down to the capsule.  We then made an incision starting at the bladder neck at 5 and 7 oclock, taking these down to the level of the verumontanum.  We then enucleated the median lobe in a retrograde fashion.  Of note, the patient's prostate was very friable and hypervascular.  It bled very easily with every touch with the laser fiber.  Even using coagulation setting, we had difficulty controlling the bleeders.  We then extended our 5 'oclock incision around the apex of the prostate on the left side.  We then made a 1 o'clock incision at the bladder neck and carried this around the apex of the prostate to meet our 5'oclock incision. Again the prostate was very friable and we had difficulty controlling hemostasis with the laser fiber. Thus, I elected to insert a resectoscope with bipolar cautery and controlled his bleeders.  As the obstruction was essentially relieved after new cleaning his median lobe, I elected to resect his bilateral lateral lobes using the resectoscope.  These were taken down and hemostasis was controlled.  I then exchange the resectoscope for the offset nephroscope with a tissue morcellator and morcellated the large median lobe.  Small prostate chips were then evacuated out. We then reinserted the continuous flow resectoscope ensured hemostasis.  Once this was done, the scope was removed and a  24-French 3-way Foley catheter was placed, 50 mL sterile water in the balloon.   Plan: Admit overnight for observation on CBI.  We will stop CBI tomorrow morning and  plan discharge home.  He will follow up on Thursday morning for void trial.  Matt R. Aliesha Dolata MD [facility]  Pager: [phone]

## 2020-01-08 NOTE — Discharge Instructions (Signed)
   Activity:  You are encouraged to ambulate frequently (about every hour during waking hours) to help prevent blood clots from forming in your legs or lungs.  However, you should not engage in any heavy lifting (> 10-15 lbs), strenuous activity, or straining.   Diet: You should advance your diet as instructed by your physician.  It will be normal to have some bloating, nausea, and abdominal discomfort intermittently.   Prescriptions:  You will be provided a prescription for pain medication to take as needed.  If your pain is not severe enough to require the prescription pain medication, you may take extra strength Tylenol instead which will have less side effects.  You should also take a prescribed stool softener to avoid straining with bowel movements as the prescription pain medication may constipate you.

## 2020-01-08 NOTE — Anesthesia Procedure Notes (Signed)
Procedure Name: Intubation Date/Time: 01/08/2020 12:38 PM Performed by: Theodosia Quay, CRNA Pre-anesthesia Checklist: Patient identified, Emergency Drugs available, Suction available and Patient being monitored Patient Re-evaluated:Patient Re-evaluated prior to induction Oxygen Delivery Method: Circle System Utilized Preoxygenation: Pre-oxygenation with 100% oxygen Induction Type: IV induction Ventilation: Mask ventilation without difficulty and Oral airway inserted - appropriate to patient size Laryngoscope Size: Miller and 3 Grade View: Grade I Tube type: Oral Tube size: 7.5 mm Number of attempts: 1 Airway Equipment and Method: Stylet and Oral airway Placement Confirmation: ETT inserted through vocal cords under direct vision,  positive ETCO2 and breath sounds checked- equal and bilateral Secured at: 24 cm Tube secured with: Tape Dental Injury: Teeth and Oropharynx as per pre-operative assessment  Comments: Placed by Tollie Eth

## 2020-01-08 NOTE — Anesthesia Postprocedure Evaluation (Signed)
Anesthesia Post Note  Patient: Brian Bond  Procedure(s) Performed: Holmium Laser Enucleation of the Prostate with Morcellation, TURP (N/A )     Patient location during evaluation: PACU Anesthesia Type: General Level of consciousness: awake and alert, patient cooperative and oriented Pain management: pain level controlled Vital Signs Assessment: post-procedure vital signs reviewed and stable Respiratory status: spontaneous breathing, nonlabored ventilation and respiratory function stable Cardiovascular status: blood pressure returned to baseline and stable Postop Assessment: no apparent nausea or vomiting Anesthetic complications: no   No complications documented.  Last Vitals:  Vitals:   01/08/20 1630 01/08/20 1645  BP: 131/81 (!) 141/83  Pulse: (!) 44 (!) 45  Resp: 13 12  Temp: 36.9 C   SpO2: 100% 100%    Last Pain:  Vitals:   01/08/20 1554  TempSrc:   PainSc: 0-No pain                 Felisa Zechman,E. Parissa Chiao

## 2020-01-08 NOTE — Progress Notes (Signed)
Pt presents for surgery with rash and peeling skin to hands (severe) , arms, neck and waist.  States scalp has been flakey as well.  States he may been exposed to chemicals at his job.  Encouraged Pt to follow up with MD as recommended.

## 2020-01-09 DIAGNOSIS — N401 Enlarged prostate with lower urinary tract symptoms: Secondary | ICD-10-CM | POA: Diagnosis not present

## 2020-01-09 DIAGNOSIS — N320 Bladder-neck obstruction: Secondary | ICD-10-CM | POA: Diagnosis not present

## 2020-01-09 DIAGNOSIS — R3912 Poor urinary stream: Secondary | ICD-10-CM | POA: Diagnosis not present

## 2020-01-09 DIAGNOSIS — R350 Frequency of micturition: Secondary | ICD-10-CM | POA: Diagnosis not present

## 2020-01-09 LAB — CBC
HCT: 38.5 % — ABNORMAL LOW (ref 39.0–52.0)
Hemoglobin: 13 g/dL (ref 13.0–17.0)
MCH: 30.7 pg (ref 26.0–34.0)
MCHC: 33.8 g/dL (ref 30.0–36.0)
MCV: 91 fL (ref 80.0–100.0)
Platelets: 176 10*3/uL (ref 150–400)
RBC: 4.23 MIL/uL (ref 4.22–5.81)
RDW: 12.3 % (ref 11.5–15.5)
WBC: 7.1 10*3/uL (ref 4.0–10.5)
nRBC: 0 % (ref 0.0–0.2)

## 2020-01-09 LAB — BASIC METABOLIC PANEL
Anion gap: 7 (ref 5–15)
BUN: 15 mg/dL (ref 8–23)
CO2: 23 mmol/L (ref 22–32)
Calcium: 7.7 mg/dL — ABNORMAL LOW (ref 8.9–10.3)
Chloride: 108 mmol/L (ref 98–111)
Creatinine, Ser: 1.02 mg/dL (ref 0.61–1.24)
GFR, Estimated: >60 mL/min (ref >60)
Glucose, Bld: 148 mg/dL — ABNORMAL HIGH (ref 70–99)
Potassium: 4.3 mmol/L (ref 3.5–5.1)
Sodium: 138 mmol/L (ref 135–145)

## 2020-01-09 MED ORDER — SODIUM CHLORIDE 0.9% FLUSH: 3.0000 mL | INTRAVENOUS | Status: DC | PRN

## 2020-01-09 MED ORDER — SODIUM CHLORIDE 0.9% FLUSH
3.0000 mL | Freq: Two times a day (BID) | INTRAVENOUS | Status: DC
  Administered 2020-01-09: 3 mL via INTRAVENOUS

## 2020-01-09 MED ORDER — CHLORHEXIDINE GLUCONATE CLOTH 2 % EX PADS
6.0000 | MEDICATED_PAD | Freq: Every day | CUTANEOUS | Status: DC
  Administered 2020-01-09: 6 via TOPICAL

## 2020-01-09 MED ORDER — SODIUM CHLORIDE 0.9 % IV SOLN: 250.0000 mL | INTRAVENOUS | Status: DC | PRN

## 2020-01-09 NOTE — Plan of Care (Signed)
  Problem: Education: Goal: Knowledge of General Education information will improve Description: Including pain rating scale, medication(s)/side effects and non-pharmacologic comfort measures Outcome: Progressing   Problem: Clinical Measurements: Goal: Will remain free from infection Outcome: Progressing Goal: Respiratory complications will improve Outcome: Progressing   Problem: Nutrition: Goal: Adequate nutrition will be maintained Outcome: Progressing   Problem: Pain Managment: Goal: General experience of comfort will improve Outcome: Progressing   Problem: Safety: Goal: Ability to remain free from injury will improve Outcome: Progressing   Problem: Education: Goal: Knowledge of the prescribed therapeutic regimen will improve Outcome: Progressing   Problem: Urinary Elimination: Goal: Ability to avoid or minimize complications of infection will improve Outcome: Progressing

## 2020-01-09 NOTE — Progress Notes (Signed)
1 Day Post-Op Subjective: Pain controlled.  No nausea or emesis.  Tolerating diet.  Tolerating Foley.  Objective: Vital signs in last 24 hours: Temp:  [97.6 F (36.4 C)-98.6 F (37 C)] 98.2 F (36.8 C) (11/02 0437) Pulse Rate:  [43-84] 52 (11/02 0437) Resp:  [9-20] 15 (11/02 0437) BP: (117-155)/(76-106) 128/81 (11/02 0437) SpO2:  [98 %-100 %] 98 % (11/02 0437) Weight:  [92 kg] 92 kg (11/01 1710)  Intake/Output from previous day: 11/01 0701 - 11/02 0700 In: 8763 [P.O.:300; I.V.:2163; IV Piggyback:100] Out: 9300 [Urine:9200; Blood:100] Intake/Output this shift: Total I/O In: 5463 [P.O.:300; I.V.:1163; Other:4000] Out: 5000 [Urine:5000]  Physical Exam:  General: Alert and oriented CV: RRR Lungs: Clear Abdomen: Soft, ND, NT Ext: NT, No erythema Foley: Clamped with return of yellow urine with slight pink tinge.  Irrigates yellow urine.  Lab Results: Recent Labs    01/08/20 1633 01/09/20 0453  HGB 15.0 13.0  HCT 46.0 38.5*   BMET Recent Labs    01/08/20 1633 01/09/20 0453  NA  --  138  K  --  4.3  CL  --  108  CO2  --  23  GLUCOSE  --  148*  BUN  --  15  CREATININE 1.08 1.02  CALCIUM  --  7.7*     Studies/Results: No results found.  Assessment/Plan: 1. BPH s/p HoLEP 01/08/2020  -Pain control prn -Diet as tolerated -Clamped CBI.  Urine yellow with slight pink tinge.  Irrigates yellow.  No clots.  Void trial in office on Thursday. -Hemoglobin appropriate 13 -Afebrile, no leukocytosis -Creatinine normal at 1.02 -Discharge home   LOS: 0 days   Jannifer Hick 01/09/2020, 6:51 AM Matt R. Kacen Mellinger MD [facility]  Pager: [phone]

## 2020-01-09 NOTE — Discharge Summary (Signed)
Date of admission: 01/08/2020  Date of discharge: 01/09/2020  Admission diagnosis: BPH  Discharge diagnosis: BPH  Secondary diagnoses: none  History and Physical: For full details, please see admission history and physical. Briefly, Brian Bond is a 79 y.o. year old patient with BPH who underwent a HoLEP on 01/08/2020.   Hospital Course: The patient recovered in the usual expected fashion.  He had hi diet advanced slowly.  Initially managed with IV pain control, then transitioned to PO meds when he was tolerating oral intake.  His labs were stable throughout the hospital course.  He was discharged to home on POD#1 after CBI clamped and there was no significant bleeding.  At the time of discharge he was tolerating a regular diet, passing flatus, ambulating, had adequate pain control and was agreeable to discharge.  Follow up as scheduled on Thursday for foley removal.  Laboratory values:  Recent Labs    01/08/20 1633 01/09/20 0453  HGB 15.0 13.0  HCT 46.0 38.5*   Recent Labs    01/08/20 1633 01/09/20 0453  CREATININE 1.08 1.02    Disposition: Home  Discharge instruction: The patient was instructed to be ambulatory but told to refrain from heavy lifting, strenuous activity, or driving.   Discharge medications:  Allergies as of 01/09/2020   No Known Allergies     Medication List    TAKE these medications   cephALEXin 500 MG capsule Commonly known as: KEFLEX Take 1 capsule (500 mg total) by mouth 2 (two) times daily for 3 days.   docusate sodium 100 MG capsule Commonly known as: Colace Take 1 capsule (100 mg total) by mouth daily as needed for up to 30 doses.   fluocinonide-emollient 0.05 % cream Commonly known as: LIDEX-E Apply 1 application topically 2 (two) times daily.   oxyCODONE-acetaminophen 5-325 MG tablet Commonly known as: Percocet Take 1 tablet by mouth every 4 (four) hours as needed for up to 10 doses for severe pain.   PROSTATE SUPPORT PO Take 3  tablets by mouth daily.   tadalafil 20 MG tablet Commonly known as: Cialis Take 1 tablet (20 mg total) by mouth daily as needed for erectile dysfunction.   temazepam 30 MG capsule Commonly known as: RESTORIL TAKE 1 CAPSULE BY MOUTH EVERY NIGHT AT BEDTIME AS NEEDED What changed: See the new instructions.       Followup:   Follow-up Information    [facility] On 01/11/2020.   Why: at 9:15AM for catheter removal Contact information: [address] [place] [address] [phone]              Matt R. Twala Collings MD [facility]  Pager: [phone]

## 2020-01-10 LAB — SURGICAL PATHOLOGY

## 2020-01-11 DIAGNOSIS — R3912 Poor urinary stream: Secondary | ICD-10-CM | POA: Diagnosis not present

## 2020-01-11 DIAGNOSIS — R350 Frequency of micturition: Secondary | ICD-10-CM | POA: Diagnosis not present

## 2020-01-11 DIAGNOSIS — N401 Enlarged prostate with lower urinary tract symptoms: Secondary | ICD-10-CM | POA: Diagnosis not present

## 2020-01-17 DIAGNOSIS — L298 Other pruritus: Secondary | ICD-10-CM | POA: Diagnosis not present

## 2020-01-17 DIAGNOSIS — L239 Allergic contact dermatitis, unspecified cause: Secondary | ICD-10-CM | POA: Diagnosis not present

## 2020-01-31 ENCOUNTER — Other Ambulatory Visit: Payer: Self-pay | Admitting: Family Medicine

## 2020-01-31 NOTE — Telephone Encounter (Signed)
Oxycodone/Acet 5/325 mg Filled by Dr Cardell Peach on:  LR 01/08/20, #10, 0 rf LOV w/dr Clent Ridges 01/02/20 FOV none scheduled.   lft VM to rtn call.  Dm/cma

## 2020-01-31 NOTE — Telephone Encounter (Signed)
Pt call and stated he need a refill on oxyCODONE-acetaminophen (PERCOCET) 5-325 MG tablet sent to  [id number] [place], [place] - [address] Phone:  [phone]  Fax:  [phone]

## 2020-02-19 ENCOUNTER — Other Ambulatory Visit: Payer: Self-pay | Admitting: Family Medicine

## 2020-02-27 ENCOUNTER — Encounter: Payer: Self-pay | Admitting: Family Medicine

## 2020-02-27 ENCOUNTER — Ambulatory Visit: Payer: Federal, State, Local not specified - PPO | Admitting: Family Medicine

## 2020-02-27 ENCOUNTER — Other Ambulatory Visit: Payer: Self-pay

## 2020-02-27 VITALS — BP 122/80 | HR 84 | Wt 195.0 lb

## 2020-02-27 DIAGNOSIS — F1190 Opioid use, unspecified, uncomplicated: Secondary | ICD-10-CM | POA: Diagnosis not present

## 2020-02-27 DIAGNOSIS — G8929 Other chronic pain: Secondary | ICD-10-CM

## 2020-02-27 DIAGNOSIS — M545 Low back pain, unspecified: Secondary | ICD-10-CM | POA: Diagnosis not present

## 2020-02-27 MED ORDER — OXYCODONE HCL 20 MG PO TABS: 20.0000 mg | ORAL_TABLET | Freq: Four times a day (QID) | ORAL | 0 refills | Status: DC | PRN

## 2020-02-27 NOTE — Progress Notes (Signed)
   Subjective:    Patient ID: Brian Bond, male    DOB: 15-Apr-1940, 79 y.o.   MRN: [id number]  HPI Here for pain management, he is doing well. We recently changed to oxycodone 20 mg and this works much better for him.  Indication for chronic opioid: low back pain Medication and dose: Oxycodone 20 mg  # pills per month: 120 Last UDS date: 08-28-19 Opioid Treatment Agreement signed (Y/N): 05-19-17 Opioid Treatment Agreement last reviewed with patient:  02-27-20 [facility] reviewed this encounter (include red flags): Yes    Review of Systems     Objective:   Physical Exam        Assessment & Plan:  Pain management, meds were refilled.  Gershon Crane, MD

## 2020-04-03 ENCOUNTER — Telehealth: Payer: Self-pay | Admitting: Family Medicine

## 2020-04-03 NOTE — Telephone Encounter (Signed)
Patient states a prior authorization is needed for Oxycodone.  Pharmacy: [facility] on [address]  Please advise.

## 2020-04-03 NOTE — Telephone Encounter (Signed)
Prior authorization has been submitted.  Waiting on response

## 2020-04-05 NOTE — Telephone Encounter (Signed)
Pt is calling to check the status of his PA and pt is aware that we are waiting on the insurance to approve it and it can take anywhere 3-5 business days for approval.

## 2020-04-08 NOTE — Telephone Encounter (Signed)
PA for Oxycodone 20 mg is approved from 03/05/2020  To 10/01/2020.  Informed pharmacy will fill prescription, called patient LVM to informed of approval

## 2020-04-09 DIAGNOSIS — R350 Frequency of micturition: Secondary | ICD-10-CM | POA: Diagnosis not present

## 2020-04-09 DIAGNOSIS — N401 Enlarged prostate with lower urinary tract symptoms: Secondary | ICD-10-CM | POA: Diagnosis not present

## 2020-04-09 DIAGNOSIS — N5201 Erectile dysfunction due to arterial insufficiency: Secondary | ICD-10-CM | POA: Diagnosis not present

## 2020-04-09 DIAGNOSIS — R3912 Poor urinary stream: Secondary | ICD-10-CM | POA: Diagnosis not present

## 2020-05-09 ENCOUNTER — Telehealth: Payer: Self-pay | Admitting: Family Medicine

## 2020-05-09 NOTE — Telephone Encounter (Signed)
Patient is requesting a refill on his Oxycodone.  Pharmacy: [facility] on [address]

## 2020-05-09 NOTE — Telephone Encounter (Signed)
Spoke with pharmacy tech at [facility], prescription was on file for Oxycodone 20mg .  Will fill prescription, call left voicemail for patient to pick up prescription later at pharmacy

## 2020-05-13 ENCOUNTER — Telehealth: Payer: Self-pay | Admitting: Family Medicine

## 2020-05-13 NOTE — Telephone Encounter (Signed)
No longer needed

## 2020-05-16 ENCOUNTER — Other Ambulatory Visit: Payer: Self-pay

## 2020-05-16 ENCOUNTER — Ambulatory Visit (INDEPENDENT_AMBULATORY_CARE_PROVIDER_SITE_OTHER): Payer: Federal, State, Local not specified - PPO

## 2020-05-16 ENCOUNTER — Encounter (HOSPITAL_COMMUNITY): Payer: Self-pay

## 2020-05-16 ENCOUNTER — Ambulatory Visit (HOSPITAL_COMMUNITY)
Admission: EM | Admit: 2020-05-16 | Discharge: 2020-05-16 | Disposition: A | Discharge status: Home / Self Care | Payer: Federal, State, Local not specified - PPO | Attending: Emergency Medicine | Admitting: Emergency Medicine

## 2020-05-16 DIAGNOSIS — R066 Hiccough: Secondary | ICD-10-CM | POA: Diagnosis not present

## 2020-05-16 DIAGNOSIS — H6123 Impacted cerumen, bilateral: Secondary | ICD-10-CM

## 2020-05-16 LAB — CBC
HCT: 45.5 % (ref 39.0–52.0)
Hemoglobin: 14.6 g/dL (ref 13.0–17.0)
MCH: 29.2 pg (ref 26.0–34.0)
MCHC: 32.1 g/dL (ref 30.0–36.0)
MCV: 91 fL (ref 80.0–100.0)
Platelets: 217 10*3/uL (ref 150–400)
RBC: 5 MIL/uL (ref 4.22–5.81)
RDW: 13.1 % (ref 11.5–15.5)
WBC: 6 10*3/uL (ref 4.0–10.5)
nRBC: 0 % (ref 0.0–0.2)

## 2020-05-16 LAB — COMPREHENSIVE METABOLIC PANEL
ALT: 20 U/L (ref 0–44)
AST: 23 U/L (ref 15–41)
Albumin: 2.4 g/dL — ABNORMAL LOW (ref 3.5–5.0)
Alkaline Phosphatase: 63 U/L (ref 38–126)
Anion gap: 5 (ref 5–15)
BUN: 10 mg/dL (ref 8–23)
CO2: 28 mmol/L (ref 22–32)
Calcium: 8.8 mg/dL — ABNORMAL LOW (ref 8.9–10.3)
Chloride: 105 mmol/L (ref 98–111)
Creatinine, Ser: 0.94 mg/dL (ref 0.61–1.24)
GFR, Estimated: >60 mL/min (ref >60)
Glucose, Bld: 97 mg/dL (ref 70–99)
Potassium: 4.5 mmol/L (ref 3.5–5.1)
Sodium: 138 mmol/L (ref 135–145)
Total Bilirubin: 0.9 mg/dL (ref 0.3–1.2)
Total Protein: 5.2 g/dL — ABNORMAL LOW (ref 6.5–8.1)

## 2020-05-16 MED ORDER — BACLOFEN 5 MG PO TABS: 5.0000 mg | ORAL_TABLET | Freq: Three times a day (TID) | ORAL | 0 refills | Status: DC

## 2020-05-16 MED ORDER — PANTOPRAZOLE SODIUM 20 MG PO TBEC: 20.0000 mg | DELAYED_RELEASE_TABLET | Freq: Every day | ORAL | 0 refills | Status: DC

## 2020-05-16 NOTE — ED Provider Notes (Signed)
HPI  SUBJECTIVE:  Brian Bond is a 80 y.o. male who presents with 1 week of daily paroxysms of hiccups.  He reports he gets are worse when he tries to eat solid foods.  It Is not affected with drinking liquids.  He reports substernal intermittent lower chest pain that lasts seconds, he states it feels as if something is "stuck" and describes it as like a "cramp".  No regurgitation.  He reports waterbrash, belching, burning chest pain.  He reports 1 episode of emesis 3 days ago after drinking a large bolus of fluids.  No further episodes of emesis although he states that he has not eaten in 3 days because it makes his hiccups worse.  No nausea, headache, fever.  No coughing, wheezing.  No chest pressure, heaviness, diaphoresis, radiation of this chest pain up his neck, down his arm or through to his back.  No exertional component.  No shortness of breath at rest.  Reports some shortness of breath with exertion which is new for him.  No change in his baseline lower extremity edema, unintentional weight gain, nocturia, PND, orthopnea.  Denies abdominal pain, distention.  Denies melena, hematochezia.  No recent procedures, surgeries, calf pain, swelling, hemoptysis, recent prolonged immobilization.  No arm or leg weakness, facial droop, slurred speech, discoordination.  No ear pain/irritation.  He ran out of his oxycodone 3 weeks ago.  He does not take any other medications other than the Restoril.  He tried herbal teas without improvement in his symptoms.  Symptoms are worse with eating.  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  No history of GERD, peptic ulcer disease, esophageal spasm, chronic kidney disease, stroke, diabetes, hypertension, cancer, DVT, PE, congestive heart failure, asthma, emphysema, COPD, smoking, MI, hypercholesterolemia, coronary artery disease.  PMD:Fry, Tera Mater, MD   Past Medical History:  Diagnosis Date  . Bell's palsy 2014  .  BPH (benign prostatic hyperplasia)   . Diverticula, colon   . Edema   . Insomnia   . Low back pain   . Osteoarthritis   . Skin rash 11/2019   hands waist neck scalp rash and skin peeling    Past Surgical History:  Procedure Laterality Date  . broken leg     right leg  . COLONOSCOPY  05-09-13   per Dr. Russella Dar, clear, no repeats needed   . FRACTURE SURGERY Right 1974    Family History  Problem Relation Age of Onset  . Breast cancer Sister   . Hypertension Other        family hx    Social History   Tobacco Use  . Smoking status: Former Smoker    Packs/day: 0.50    Years: 5.00    Pack years: 2.50    Types: Cigarettes    Quit date: 05/03/1991    Years since quitting: 29.0  . Smokeless tobacco: Never Used  Vaping Use  . Vaping Use: Never used  Substance Use Topics  . Alcohol use: Yes    Alcohol/week: 4.0 standard drinks    Types: 4 Cans of beer per week    Comment: occ  . Drug use: No    No current facility-administered medications for this encounter.  Current Outpatient Medications:  .  baclofen 5 MG TABS, Take 5 mg by mouth 3 (three) times daily., Disp: 30 tablet, Rfl: 0 .  pantoprazole (PROTONIX) 20 MG tablet, Take 1 tablet (20 mg total) by mouth  daily., Disp: 30 tablet, Rfl: 0 .  temazepam (RESTORIL) 30 MG capsule, TAKE 1 CAPSULE BY MOUTH EVERY NIGHT AT BEDTIME AS NEEDED, Disp: 30 capsule, Rfl: 5  No Known Allergies   ROS  As noted in HPI.   Physical Exam  BP 139/84   Pulse 62   Temp 98.3 F (36.8 C)   Resp 17   SpO2 100%   Constitutional: Well developed, well nourished, no acute distress Eyes:  EOMI, conjunctiva normal bilaterally PERRLA HENT: Normocephalic, atraumatic,mucus membranes moist.  TMs obscured with cerumen bilaterally. Neck: No cervical lymphadenopathy, thyromegaly. Respiratory: Normal inspiratory effort, lungs clear bilaterally Cardiovascular: Normal rate regular rhythm, no murmurs rubs or gallops.  No chest wall tenderness. GI:  nondistended soft, nontender, no rebound, guarding.  No palpable masses.  Active bowel sounds. skin: No rash, skin intact Musculoskeletal: no deformities.  Trace edema bilaterally.  Calves symmetric, nontender. Neurologic: Alert & oriented x 3, coordination normal, cranial nerves III through XII intact, speech fluent, no facial droop. Psychiatric: Speech and behavior appropriate   ED Course   Medications - No data to display  Orders Placed This Encounter  Procedures  . DG Chest 2 View    Standing Status:   Standing    Number of Occurrences:   1    Order Specific Question:   Reason for Exam (SYMPTOM  OR DIAGNOSIS REQUIRED)    Answer:   hiccups x 1 week r/o mass, PNA,  . CBC    Standing Status:   Standing    Number of Occurrences:   1  . Comprehensive metabolic panel    Standing Status:   Standing    Number of Occurrences:   1  . ED EKG    hiccups    Standing Status:   Standing    Number of Occurrences:   1    Order Specific Question:   Reason for Exam    Answer:   Other (See Comments)  . Ear Cerumen Removal    bilateral    Standing Status:   Standing    Number of Occurrences:   1    Results for orders placed or performed during the hospital encounter of 05/16/20 (from the past 24 hour(s))  CBC     Status: None   Collection Time: 05/16/20  3:47 PM  Result Value Ref Range   WBC 6.0 4.0 - 10.5 K/uL   RBC 5.00 4.22 - 5.81 MIL/uL   Hemoglobin 14.6 13.0 - 17.0 g/dL   HCT 40.9 81.1 - 91.4 %   MCV 91.0 80.0 - 100.0 fL   MCH 29.2 26.0 - 34.0 pg   MCHC 32.1 30.0 - 36.0 g/dL   RDW 78.2 95.6 - 21.3 %   Platelets 217 150 - 400 K/uL   nRBC 0.0 0.0 - 0.2 %  Comprehensive metabolic panel     Status: Abnormal   Collection Time: 05/16/20  3:47 PM  Result Value Ref Range   Sodium 138 135 - 145 mmol/L   Potassium 4.5 3.5 - 5.1 mmol/L   Chloride 105 98 - 111 mmol/L   CO2 28 22 - 32 mmol/L   Glucose, Bld 97 70 - 99 mg/dL   BUN 10 8 - 23 mg/dL   Creatinine, Ser 0.86 0.61 - 1.24  mg/dL   Calcium 8.8 (L) 8.9 - 10.3 mg/dL   Total Protein 5.2 (L) 6.5 - 8.1 g/dL   Albumin 2.4 (L) 3.5 - 5.0 g/dL   AST 23 15 - 41  U/L   ALT 20 0 - 44 U/L   Alkaline Phosphatase 63 38 - 126 U/L   Total Bilirubin 0.9 0.3 - 1.2 mg/dL   GFR, Estimated >27 >03 mL/min   Anion gap 5 5 - 15   DG Chest 2 View  Result Date: 05/16/2020 CLINICAL DATA:  Hiccups. EXAM: CHEST - 2 VIEW COMPARISON:  No prior. FINDINGS: Mediastinum and hilar structures normal. Heart size normal. No focal infiltrate. No pleural effusion or pneumothorax. Degenerative change thoracic spine. IMPRESSION: No active cardiopulmonary disease. Electronically Signed   By: Maisie Fus  Register   On: 05/16/2020 15:26    ED Clinical Impression  1. Hiccups   2. Bilateral impacted cerumen      ED Assessment/Plan  Normal BUN/creatinine November 21.  Patient with hiccups for a week.  Based on his symptoms, suspect esophageal distention/esophagitis versus GERD.  His abdomen is soft, nontender.  Doubt obstruction.  Does not appear to be a stroke, meningitis, he denies sore throat.  He has bilateral cerumen impaction, which could be contributing to his hiccups, so we will have this irrigated out.  No appreciable cervical lymphadenopathy.  He is not tachycardic, satting 100% on room air, no evidence of right heart strain on EKG, doubt PE. He is not on any drugs that would cause hiccups.  He has no history of diabetes or electrolyte disorders.   will check chest x-ray looking for pneumonia, aortic aneurysm, EKG for MI, pericarditis, CBC because of shortness of breath with exertion, CMP.  EKG: Sinus bradycardia, rate 58.  Normal axis, normal intervals.  No hypertrophy.  Q V1, V2, V3.  Q waves present in V1 V2 and EKG from 6/17.  No ST elevation.  Reviewed imaging independently.  Normal chest x-ray.  See radiology report for details  No evidence of MI, pericarditis on EKG. reassuring chest x-ray.  He is not anemic.  CMP pending.  Patient has  borderline low calcium, do not think that this is clinically significant.  He has low albumin, but I do not think that this is contributing to his symptoms today.  Otherwise his CMP is normal.  Called patient regarding low albumin result.  It has been trending down over the past 2 years.  Advised outpatient follow-up with PMD.  will send home with Protonix, baclofen 5 mg 3 times daily, physical maneuvers including breath-holding for 5 to 10 seconds as tolerated, Valsalva maneuver holding for 5 seconds sipping on gargling with very cold water, biting into a lemon, swallowing a teaspoon a dry sugar, pressing gently but firmly on the eyeballs.  He will need to follow-up with his PMD for further work-up.  Discussed EKG, imaging, labs, MDM, treatment plan, and plan for follow-up with patient. Discussed sn/sx that should prompt return to the ED. patient agrees with plan.   Meds ordered this encounter  Medications  . pantoprazole (PROTONIX) 20 MG tablet    Sig: Take 1 tablet (20 mg total) by mouth daily.    Dispense:  30 tablet    Refill:  0  . baclofen 5 MG TABS    Sig: Take 5 mg by mouth 3 (three) times daily.    Dispense:  30 tablet    Refill:  0    *This clinic note was created using Scientist, clinical (histocompatibility and immunogenetics). Therefore, there may be occasional mistakes despite careful proofreading.   ?    Domenick Gong, MD 05/16/20 1836

## 2020-05-16 NOTE — ED Triage Notes (Signed)
Pt in with c/o having "hiccup spasms" that have been going on for 1 week now  States that nothing has helped his hiccups go away

## 2020-05-16 NOTE — Discharge Instructions (Addendum)
We have washed out your ears today.  Sometimes a foreign body in the ear can cause hiccups.  Your EKG and chest x-ray were fine.  You are not anemic.  1 more lab test is pending.   Try Protonix, and if that does not work, then start baclofen.  You can try physical maneuvers such as breath-holding for 5 to 10 seconds as tolerated, bearing down, like you are trying to have a bowel movement and holding this for 5 seconds, sipping or gargling with very cold water, biting into a lemon, swallowing a teaspoon a dry sugar, pressing gently but firmly on the eyeballs.   I will call you ONLY if your labs come back abnormal.  If you do not hear from me by the end of the day, then you can assume that they are fine.  Please follow-up with your doctor if these medications do not work.

## 2020-07-01 ENCOUNTER — Telehealth: Payer: Self-pay | Admitting: Family Medicine

## 2020-07-01 NOTE — Telephone Encounter (Signed)
Patient dropped off FMLA paperwork that he would like Dr. Clent Ridges to fill out.   Patient would like a call at [phone] when paperwork is completed and ready to be picked up.  Paper will be in folder.  Please advise.

## 2020-07-02 DIAGNOSIS — Z0279 Encounter for issue of other medical certificate: Secondary | ICD-10-CM

## 2020-07-02 NOTE — Telephone Encounter (Signed)
The form is ready  

## 2020-07-04 NOTE — Telephone Encounter (Signed)
Called and LVM letting pt know forms are complete for pick up.

## 2020-07-04 NOTE — Telephone Encounter (Signed)
Forms have been placed in the front cabinet for pt to pick up.

## 2020-07-04 NOTE — Telephone Encounter (Signed)
Pt dropped off FMLA forms at our office  for completing, forms have been completed and are ready for pick up. Left a detailed message on pt phone to pick up forms at the office per pt

## 2020-08-19 ENCOUNTER — Other Ambulatory Visit: Payer: Self-pay | Admitting: Family Medicine

## 2020-08-20 NOTE — Telephone Encounter (Signed)
Last office visit- 02/27/20 Last refill- 02/21/20-30 tabs-5 refills  No future appointment scheduled

## 2020-09-02 ENCOUNTER — Telehealth: Payer: Self-pay | Admitting: Family Medicine

## 2020-09-02 ENCOUNTER — Encounter (HOSPITAL_COMMUNITY): Payer: Self-pay

## 2020-09-02 ENCOUNTER — Other Ambulatory Visit: Payer: Self-pay

## 2020-09-02 ENCOUNTER — Ambulatory Visit (HOSPITAL_COMMUNITY)
Admission: EM | Admit: 2020-09-02 | Discharge: 2020-09-02 | Disposition: A | Discharge status: Home / Self Care | Payer: Federal, State, Local not specified - PPO | Attending: Emergency Medicine | Admitting: Emergency Medicine

## 2020-09-02 DIAGNOSIS — H6503 Acute serous otitis media, bilateral: Secondary | ICD-10-CM

## 2020-09-02 DIAGNOSIS — H918X1 Other specified hearing loss, right ear: Secondary | ICD-10-CM | POA: Diagnosis not present

## 2020-09-02 DIAGNOSIS — H7291 Unspecified perforation of tympanic membrane, right ear: Secondary | ICD-10-CM

## 2020-09-02 MED ORDER — KETOROLAC TROMETHAMINE 30 MG/ML IJ SOLN
INTRAMUSCULAR | Status: AC
  Filled 2020-09-02: qty 1

## 2020-09-02 MED ORDER — KETOROLAC TROMETHAMINE 30 MG/ML IJ SOLN
30.0000 mg | Freq: Once | INTRAMUSCULAR | Status: AC
  Administered 2020-09-02: 30 mg via INTRAMUSCULAR

## 2020-09-02 MED ORDER — AMOXICILLIN-POT CLAVULANATE 875-125 MG PO TABS: 1.0000 | ORAL_TABLET | Freq: Two times a day (BID) | ORAL | 0 refills | Status: DC

## 2020-09-02 MED ORDER — CIPRO HC 0.2-1 % OT SUSP: 3.0000 [drp] | Freq: Two times a day (BID) | OTIC | 1 refills | Status: DC

## 2020-09-02 NOTE — Telephone Encounter (Signed)
This patient was seen at  [facility] on 09/01/20.

## 2020-09-02 NOTE — ED Triage Notes (Signed)
Pt in with c/o bilateral ear pain that started 5 days ago and worsened last night. Pt states he cannot hear   Pt states he put peroxide in his ears with no relief

## 2020-09-02 NOTE — Telephone Encounter (Signed)
Son call and stated pt want dr.Fry to know that he have a busted eardrum.

## 2020-09-02 NOTE — Discharge Instructions (Addendum)
You will need to call today to see ears doctor  Follow up with your primary care doctor in 2 days  Take tylenol as needed for pain Use ear drops in both ears and place  Make sure to eat with medications  Take the full dose of medications even if you feel better

## 2020-09-02 NOTE — ED Provider Notes (Signed)
[facility]    CSN: [id number] Arrival date & time: 09/02/20  0809      History   Chief Complaint Chief Complaint  Patient presents with   Otalgia    HPI Brian Bond is a 80 y.o. male.   Pt states for several days his rt ear has hurt , drainage, and loss of hearing. Lt ear some discomfort but not as much as the rt. Pt works at the [employer] and thought he may have got some chemicals on his hands and touch his ear  he is unsure. Pt has put peroxide in the area with no relief. Has never had this before. Denies any cough, congestion, or any upper resp symptoms. Unsure of any fever.    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 Russella Dar, clear, no repeats needed    FRACTURE SURGERY Right 1974       Home Medications    Prior to Admission medications   Medication Sig Start Date End Date Taking? Authorizing Provider  amoxicillin-clavulanate (AUGMENTIN) 875-125 MG tablet Take 1 tablet by mouth every 12 (twelve) hours. 09/02/20  Yes Coralyn Mark, NP  ciprofloxacin-hydrocortisone (CIPRO HC) OTIC suspension Place 3 drops into both ears 2 (two) times daily. 09/02/20  Yes Maple Mirza L, NP  baclofen 5 MG TABS Take 5 mg by mouth 3 (three) times daily. 05/16/20   Domenick Gong, MD  pantoprazole (PROTONIX) 20 MG tablet Take 1  tablet (20 mg total) by mouth daily. 05/16/20   Domenick Gong, MD  temazepam (RESTORIL) 30 MG capsule TAKE 1 CAPSULE BY MOUTH EVERY NIGHT AT BEDTIME AS NEEDED 08/21/20   Nelwyn Salisbury, MD    Family History Family History  Problem Relation Age of Onset   Breast cancer Sister    Hypertension Other        family hx    Social History Social History   Tobacco Use   Smoking status: Former    Packs/day: 0.50    Years: 5.00    Pack years: 2.50    Types: Cigarettes    Quit date: 05/03/1991    Years since quitting: 29.3   Smokeless tobacco: Never  Vaping Use   Vaping Use: Never used  Substance Use Topics   Alcohol use: Yes    Alcohol/week: 4.0 standard drinks    Types: 4 Cans of beer per week    Comment: occ   Drug use: No     Allergies   Patient has no known allergies.   Review of Systems Review of Systems  Constitutional:  Positive for chills. Negative for fever.  HENT:  Positive for ear discharge, ear pain, facial swelling and hearing loss. Negative for congestion, mouth sores, postnasal drip, rhinorrhea,  sinus pressure, sinus pain, sneezing and sore throat.        Drainage from rt ear , pain in both ear   Respiratory: Negative.    Cardiovascular: Negative.   Gastrointestinal: Negative.   Neurological: Negative.     Physical Exam Triage Vital Signs ED Triage Vitals  Enc Vitals Group     BP 09/02/20 0839 (!) 148/86     Pulse Rate 09/02/20 0839 64     Resp 09/02/20 0839 18     Temp 09/02/20 0839 99.3 F (37.4 C)     Temp Source 09/02/20 0839 Oral     SpO2 09/02/20 0839 95 %     Weight --      Height --      Head Circumference --      Peak Flow --      Pain Score 09/02/20 0836 6     Pain Loc --      Pain Edu? --      Excl. in GC? --    No data found.  Updated Vital Signs BP (!) 148/86 (BP Location: Left Arm)   Pulse 64   Temp 99.3 F (37.4 C) (Oral)   Resp 18   SpO2 95%   Visual Acuity     Physical Exam Constitutional:      Appearance: He  is ill-appearing.  HENT:     Ears:     Comments: Large amount of serosanguious to RT drum, perforated TM. Erythema to bil drum, LT TM intact buldiging,     Nose: Nose normal.  Eyes:     Pupils: Pupils are equal, round, and reactive to light.  Cardiovascular:     Rate and Rhythm: Normal rate.     Pulses: Normal pulses.  Pulmonary:     Effort: Pulmonary effort is normal.  Abdominal:     General: Abdomen is flat.  Musculoskeletal:     Cervical back: Normal range of motion.  Neurological:     General: No focal deficit present.     Mental Status: He is alert.     UC Treatments / Results  Labs (all labs ordered are listed, but only abnormal results are displayed) Labs Reviewed - No data to display  EKG   Radiology No results found.  Procedures Procedures (including critical care time)  Medications Ordered in UC Medications  ketorolac (TORADOL) 30 MG/ML injection 30 mg (has no administration in time range)    Initial Impression / Assessment and Plan / UC Course  I have reviewed the triage vital signs and the nursing notes.  Pertinent labs & imaging results that were available during my care of the patient were reviewed by me and considered in my medical decision making (see chart for details).     Cleaned out drum of ear educated to pt not to place anything in drum except medications  Make sure to keep ear dry  Will need to call today to see ENT  Follow up with pcp ion 2 days  Take tylenol as needed for pain   Final Clinical Impressions(s) / UC Diagnoses   Final diagnoses:  Non-recurrent acute serous otitis media of both ears  Perforation of ear drum, right  Other specified hearing loss of right ear, unspecified hearing status on contralateral side     Discharge Instructions      You will need to call today to see ears doctor  Follow up with your primary care doctor in 2 days  Take tylenol as  needed for pain Use ear drops in both ears and place  Make  sure to eat with medications  Take the full dose of medications even if you feel better       ED Prescriptions     Medication Sig Dispense Auth. Provider   amoxicillin-clavulanate (AUGMENTIN) 875-125 MG tablet Take 1 tablet by mouth every 12 (twelve) hours. 14 tablet Maple Mirza L, NP   ciprofloxacin-hydrocortisone (CIPRO HC) OTIC suspension Place 3 drops into both ears 2 (two) times daily. 10 mL Coralyn Mark, NP      PDMP not reviewed this encounter.   Coralyn Mark, NP 09/02/20 [phone]

## 2020-09-03 ENCOUNTER — Ambulatory Visit: Payer: Federal, State, Local not specified - PPO | Admitting: Family Medicine

## 2020-09-03 ENCOUNTER — Encounter: Payer: Self-pay | Admitting: Family Medicine

## 2020-09-03 VITALS — BP 128/78 | HR 80 | Temp 98.5°F | Wt 202.0 lb

## 2020-09-03 DIAGNOSIS — H66011 Acute suppurative otitis media with spontaneous rupture of ear drum, right ear: Secondary | ICD-10-CM | POA: Diagnosis not present

## 2020-09-03 DIAGNOSIS — H6122 Impacted cerumen, left ear: Secondary | ICD-10-CM | POA: Diagnosis not present

## 2020-09-03 NOTE — Telephone Encounter (Signed)
Will see him today

## 2020-09-03 NOTE — Progress Notes (Signed)
   Subjective:    Patient ID: Brian Bond, male    DOB: 1941-02-24, 80 y.o.   MRN: [id number]  HPI Here to follow up on an urgent care visit yesterday morning. Two nights ago he developed sudden sharp pain in the right ear and he noticed fluid draining from the ear. No sinus congestion or ST or cough or fever. He also notes that his hearing has been reduced on both sides for several weeks. At the urgent care he was diagnosed with "bilateral serous otitis media" with a perforation in the right TM. He was started on Augmentin and Cipro Otic ear drops. Today the pain in the right ear has decreased a lot, but there is still some mild pain. No dizziness.    Review of Systems  Constitutional: Negative.   HENT:  Positive for ear discharge, ear pain and hearing loss. Negative for congestion, postnasal drip, sinus pain and sore throat.   Eyes: Negative.   Respiratory: Negative.    Cardiovascular: Negative.   Neurological: Negative.       Objective:   Physical Exam Constitutional:      General: He is not in acute distress.    Appearance: Normal appearance.  HENT:     Left Ear: There is impacted cerumen.     Ears:     Comments: The left ear canal is totally impacted with cerumen. The right canal is clear but the TM is red and swollen. No perforations are seen.  Cardiovascular:     Rate and Rhythm: Normal rate and regular rhythm.     Pulses: Normal pulses.     Heart sounds: Normal heart sounds.  Pulmonary:     Effort: Pulmonary effort is normal.     Breath sounds: Normal breath sounds.  Lymphadenopathy:     Cervical: No cervical adenopathy.  Neurological:     Mental Status: He is alert.          Assessment & Plan:  He has an otitis media on the right side, and a cerumen impaction on the left. We obtained informed consent to irrigate the left canal with water, but this was unsuccessful. He tolerated the procedure well, but his hearing was still blocked. He will take the Augmentin  and use the Cipro drops. Refer to ENT for the cerumen impaction.  Gershon Crane, MD

## 2020-09-18 DIAGNOSIS — H6122 Impacted cerumen, left ear: Secondary | ICD-10-CM | POA: Diagnosis not present

## 2020-09-18 DIAGNOSIS — H6091 Unspecified otitis externa, right ear: Secondary | ICD-10-CM | POA: Diagnosis not present

## 2020-09-19 ENCOUNTER — Telehealth: Payer: Self-pay | Admitting: Family Medicine

## 2020-09-19 NOTE — Telephone Encounter (Signed)
The patient dropped off FMLA forms  Contact the patient when forms are completed at [phone]  Disposition: Dr's folder

## 2020-09-20 NOTE — Telephone Encounter (Signed)
Prior FMLA forms were completed/scanned in on 07/02/20.

## 2020-09-24 DIAGNOSIS — Z0279 Encounter for issue of other medical certificate: Secondary | ICD-10-CM

## 2020-09-24 NOTE — Telephone Encounter (Signed)
The form is ready  

## 2020-09-24 NOTE — Telephone Encounter (Signed)
I left a detailed message at the pts cell number stating the forms were completed and left at the front desk for pick up.  Copy sent to be scanned.

## 2020-10-18 DIAGNOSIS — H903 Sensorineural hearing loss, bilateral: Secondary | ICD-10-CM | POA: Diagnosis not present

## 2020-11-05 ENCOUNTER — Telehealth: Payer: Self-pay | Admitting: Family Medicine

## 2020-11-05 ENCOUNTER — Telehealth (INDEPENDENT_AMBULATORY_CARE_PROVIDER_SITE_OTHER): Payer: Federal, State, Local not specified - PPO | Admitting: Family Medicine

## 2020-11-05 ENCOUNTER — Encounter: Payer: Self-pay | Admitting: Family Medicine

## 2020-11-05 DIAGNOSIS — J40 Bronchitis, not specified as acute or chronic: Secondary | ICD-10-CM | POA: Diagnosis not present

## 2020-11-05 MED ORDER — HYDROCODONE BIT-HOMATROP MBR 5-1.5 MG/5ML PO SOLN: 5.0000 mL | ORAL | 0 refills | Status: DC | PRN

## 2020-11-05 MED ORDER — AMOXICILLIN-POT CLAVULANATE 875-125 MG PO TABS: 1.0000 | ORAL_TABLET | Freq: Two times a day (BID) | ORAL | 0 refills | Status: DC

## 2020-11-05 NOTE — Telephone Encounter (Signed)
Pt had a telephone visit with Dr Clent Ridges this morning

## 2020-11-05 NOTE — Telephone Encounter (Signed)
Patient called the after hours nurse line returning a call to the office.

## 2020-11-05 NOTE — Progress Notes (Signed)
   Subjective:    Patient ID: Brian Bond, male    DOB: 04-10-1940, 80 y.o.   MRN: [id number]  HPI Virtual Visit via Telephone Note  I connected with the patient on 11/05/20 at 10:00 AM EDT by telephone and verified that I am speaking with the correct person using two identifiers.   I discussed the limitations, risks, security and privacy concerns of performing an evaluation and management service by telephone and the availability of in person appointments. I also discussed with the patient that there may be a patient responsible charge related to this service. The patient expressed understanding and agreed to proceed.  Location patient: home Location provider: work or home office Participants present for the call: patient, provider Patient did not have a visit in the prior 7 days to address this/these issue(s).   History of Present Illness: Here for 5 days of chest congestion and a dry cough. No chest pain or fever or SOB. No body aches or NVD. He is drinking fluids and taking Mucinex.    Observations/Objective: Patient sounds cheerful and well on the phone. I do not appreciate any SOB. Speech and thought processing are grossly intact. Patient reported vitals:  Assessment and Plan: Bronchitis, treat with 10 days of Augmentin. Recheck as needed.  Gershon Crane, MD   Follow Up Instructions:     [phone] 5-10 [phone] 11-20 9443 21-30 I did not refer this patient for an OV in the next 24 hours for this/these issue(s).  I discussed the assessment and treatment plan with the patient. The patient was provided an opportunity to ask questions and all were answered. The patient agreed with the plan and demonstrated an understanding of the instructions.   The patient was advised to call back or seek an in-person evaluation if the symptoms worsen or if the condition fails to improve as anticipated.  I provided  18 minutes of non-face-to-face time during this encounter.   Gershon Crane, MD     Review of Systems     Objective:   Physical Exam        Assessment & Plan:

## 2020-11-21 DIAGNOSIS — L244 Irritant contact dermatitis due to drugs in contact with skin: Secondary | ICD-10-CM | POA: Diagnosis not present

## 2020-11-21 DIAGNOSIS — L298 Other pruritus: Secondary | ICD-10-CM | POA: Diagnosis not present

## 2020-11-26 ENCOUNTER — Ambulatory Visit (INDEPENDENT_AMBULATORY_CARE_PROVIDER_SITE_OTHER): Payer: Federal, State, Local not specified - PPO | Admitting: Family Medicine

## 2020-11-26 ENCOUNTER — Encounter: Payer: Self-pay | Admitting: Family Medicine

## 2020-11-26 ENCOUNTER — Other Ambulatory Visit: Payer: Self-pay

## 2020-11-26 VITALS — BP 118/80 | HR 51 | Temp 98.9°F | Ht 77.0 in | Wt 204.0 lb

## 2020-11-26 DIAGNOSIS — Z125 Encounter for screening for malignant neoplasm of prostate: Secondary | ICD-10-CM | POA: Diagnosis not present

## 2020-11-26 DIAGNOSIS — Z23 Encounter for immunization: Secondary | ICD-10-CM | POA: Diagnosis not present

## 2020-11-26 DIAGNOSIS — Z0000 Encounter for general adult medical examination without abnormal findings: Secondary | ICD-10-CM

## 2020-11-26 LAB — BASIC METABOLIC PANEL
BUN: 17 mg/dL (ref 6–23)
CO2: 28 mEq/L (ref 19–32)
Calcium: 9.1 mg/dL (ref 8.4–10.5)
Chloride: 107 mEq/L (ref 96–112)
Creatinine, Ser: 1.15 mg/dL (ref 0.40–1.50)
GFR: 60.7 mL/min (ref >60.00)
Glucose, Bld: 89 mg/dL (ref 70–99)
Potassium: 4.3 mEq/L (ref 3.5–5.1)
Sodium: 142 mEq/L (ref 135–145)

## 2020-11-26 LAB — TSH: TSH: 1.31 u[IU]/mL (ref 0.35–5.50)

## 2020-11-26 LAB — LIPID PANEL
Cholesterol: 164 mg/dL (ref 0–200)
HDL: 62 mg/dL (ref >39.00)
LDL Cholesterol: 96 mg/dL (ref 0–99)
NonHDL: 102.19
Total CHOL/HDL Ratio: 3
Triglycerides: 31 mg/dL (ref 0.0–149.0)
VLDL: 6.2 mg/dL (ref 0.0–40.0)

## 2020-11-26 LAB — HEMOGLOBIN A1C: Hgb A1c MFr Bld: 5.8 % (ref 4.6–6.5)

## 2020-11-26 LAB — CBC WITH DIFFERENTIAL/PLATELET
Basophils Absolute: 0 10*3/uL (ref 0.0–0.1)
Basophils Relative: 0.7 % (ref 0.0–3.0)
Eosinophils Absolute: 0.1 10*3/uL (ref 0.0–0.7)
Eosinophils Relative: 2.4 % (ref 0.0–5.0)
HCT: 45 % (ref 39.0–52.0)
Hemoglobin: 14.6 g/dL (ref 13.0–17.0)
Lymphocytes Relative: 27.8 % (ref 12.0–46.0)
Lymphs Abs: 1.2 10*3/uL (ref 0.7–4.0)
MCHC: 32.4 g/dL (ref 30.0–36.0)
MCV: 90.5 fl (ref 78.0–100.0)
Monocytes Absolute: 0.4 10*3/uL (ref 0.1–1.0)
Monocytes Relative: 9.5 % (ref 3.0–12.0)
Neutro Abs: 2.6 10*3/uL (ref 1.4–7.7)
Neutrophils Relative %: 59.6 % (ref 43.0–77.0)
Platelets: 203 10*3/uL (ref 150.0–400.0)
RBC: 4.98 Mil/uL (ref 4.22–5.81)
RDW: 14.5 % (ref 11.5–15.5)
WBC: 4.3 10*3/uL (ref 4.0–10.5)

## 2020-11-26 LAB — HEPATIC FUNCTION PANEL
ALT: 26 U/L (ref 0–53)
AST: 25 U/L (ref 0–37)
Albumin: 4 g/dL (ref 3.5–5.2)
Alkaline Phosphatase: 55 U/L (ref 39–117)
Bilirubin, Direct: 0.2 mg/dL (ref 0.0–0.3)
Total Bilirubin: 0.6 mg/dL (ref 0.2–1.2)
Total Protein: 7.3 g/dL (ref 6.0–8.3)

## 2020-11-26 LAB — PSA: PSA: 5.03 ng/mL — ABNORMAL HIGH (ref 0.10–4.00)

## 2020-11-26 NOTE — Addendum Note (Signed)
Addended by: Kandra Nicolas on: 11/26/2020 10:20 AM   Modules accepted: Orders

## 2020-11-26 NOTE — Addendum Note (Signed)
Addended by: Carola Rhine on: 11/26/2020 01:46 PM   Modules accepted: Orders

## 2020-11-26 NOTE — Progress Notes (Signed)
   Subjective:    Patient ID: Brian Bond, male    DOB: November 29, 1940, 80 y.o.   MRN: [id number]  HPI Here for a well exam. He feels fine. He still works full time for the [employer].    Review of Systems  Constitutional: Negative.   HENT: Negative.    Eyes: Negative.   Respiratory: Negative.    Cardiovascular: Negative.   Gastrointestinal: Negative.   Genitourinary: Negative.   Musculoskeletal: Negative.   Skin: Negative.   Neurological: Negative.   Psychiatric/Behavioral: Negative.        Objective:   Physical Exam Constitutional:      General: He is not in acute distress.    Appearance: Normal appearance. He is well-developed. He is not diaphoretic.  HENT:     Head: Normocephalic and atraumatic.     Right Ear: External ear normal.     Left Ear: External ear normal.     Nose: Nose normal.     Mouth/Throat:     Pharynx: No oropharyngeal exudate.  Eyes:     General: No scleral icterus.       Right eye: No discharge.        Left eye: No discharge.     Conjunctiva/sclera: Conjunctivae normal.     Pupils: Pupils are equal, round, and reactive to light.  Neck:     Thyroid: No thyromegaly.     Vascular: No JVD.     Trachea: No tracheal deviation.  Cardiovascular:     Rate and Rhythm: Normal rate and regular rhythm.     Heart sounds: Normal heart sounds. No murmur heard.   No friction rub. No gallop.  Pulmonary:     Effort: Pulmonary effort is normal. No respiratory distress.     Breath sounds: Normal breath sounds. No wheezing or rales.  Chest:     Chest wall: No tenderness.  Abdominal:     General: Bowel sounds are normal. There is no distension.     Palpations: Abdomen is soft. There is no mass.     Tenderness: There is no abdominal tenderness. There is no guarding or rebound.  Genitourinary:    Penis: Normal. No tenderness.      Testes: Normal.  Musculoskeletal:        General: No tenderness. Normal range of motion.     Cervical back: Neck supple.   Lymphadenopathy:     Cervical: No cervical adenopathy.  Skin:    General: Skin is warm and dry.     Coloration: Skin is not pale.     Findings: No erythema or rash.  Neurological:     Mental Status: He is alert and oriented to person, place, and time.     Cranial Nerves: No cranial nerve deficit.     Motor: No abnormal muscle tone.     Coordination: Coordination normal.     Deep Tendon Reflexes: Reflexes are normal and symmetric. Reflexes normal.  Psychiatric:        Behavior: Behavior normal.        Thought Content: Thought content normal.        Judgment: Judgment normal.          Assessment & Plan:  Well exam. We discussed diet and exercise. Get fasting labs.  Gershon Crane, MD

## 2020-11-28 ENCOUNTER — Telehealth: Payer: Self-pay | Admitting: Family Medicine

## 2020-11-28 NOTE — Telephone Encounter (Signed)
PT called to return the missed call about lab results. 

## 2020-11-29 ENCOUNTER — Other Ambulatory Visit: Payer: Self-pay

## 2020-11-29 DIAGNOSIS — R972 Elevated prostate specific antigen [PSA]: Secondary | ICD-10-CM

## 2020-11-29 NOTE — Telephone Encounter (Signed)
Spoke with pt advised that I will send Dr Clent Ridges a message regarding pt request, advised I will call pt later when Dr Clent Ridges responds

## 2020-12-30 ENCOUNTER — Other Ambulatory Visit (INDEPENDENT_AMBULATORY_CARE_PROVIDER_SITE_OTHER): Payer: Federal, State, Local not specified - PPO

## 2020-12-30 ENCOUNTER — Other Ambulatory Visit: Payer: Self-pay

## 2020-12-30 DIAGNOSIS — R972 Elevated prostate specific antigen [PSA]: Secondary | ICD-10-CM | POA: Diagnosis not present

## 2020-12-31 LAB — PSA: Prostate Specific Ag, Serum: 4.5 ng/mL — ABNORMAL HIGH (ref 0.0–4.0)

## 2021-01-01 ENCOUNTER — Telehealth: Payer: Self-pay

## 2021-01-01 NOTE — Telephone Encounter (Signed)
Spoke with patient, he stated that last night he went to sleep with the windows in his home open, and this morning he has a bad "stuffy head."  He currently has a runny nose and a sore throat.Marland Kitchen  Pharmacy updated

## 2021-01-01 NOTE — Telephone Encounter (Signed)
He may have a cold. He can try saline nasal sprays and Mucinex 1200 mg BID

## 2021-01-01 NOTE — Telephone Encounter (Signed)
Called patient, left detail message, and to call back with any questions.

## 2021-01-09 DIAGNOSIS — N5201 Erectile dysfunction due to arterial insufficiency: Secondary | ICD-10-CM | POA: Diagnosis not present

## 2021-01-09 DIAGNOSIS — N401 Enlarged prostate with lower urinary tract symptoms: Secondary | ICD-10-CM | POA: Diagnosis not present

## 2021-01-09 DIAGNOSIS — R972 Elevated prostate specific antigen [PSA]: Secondary | ICD-10-CM | POA: Diagnosis not present

## 2021-01-09 DIAGNOSIS — R350 Frequency of micturition: Secondary | ICD-10-CM | POA: Diagnosis not present

## 2021-02-12 ENCOUNTER — Ambulatory Visit: Payer: Federal, State, Local not specified - PPO | Admitting: Family Medicine

## 2021-02-12 ENCOUNTER — Other Ambulatory Visit: Payer: Self-pay | Admitting: Family Medicine

## 2021-02-12 ENCOUNTER — Encounter: Payer: Self-pay | Admitting: Family Medicine

## 2021-02-12 VITALS — BP 130/74 | HR 74 | Temp 98.0°F | Ht 77.0 in | Wt 224.7 lb

## 2021-02-12 DIAGNOSIS — R600 Localized edema: Secondary | ICD-10-CM

## 2021-02-12 MED ORDER — POTASSIUM CHLORIDE ER 10 MEQ PO TBCR: 10.0000 meq | EXTENDED_RELEASE_TABLET | Freq: Every day | ORAL | 1 refills | Status: DC

## 2021-02-12 MED ORDER — FUROSEMIDE 40 MG PO TABS: 40.0000 mg | ORAL_TABLET | Freq: Every day | ORAL | 1 refills | Status: DC
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
## 2021-02-12 NOTE — Patient Instructions (Addendum)
Elevate legs frequently  Return tomorrow for labs  Start the Furosemide 40 mg once daily    Set up follow up with Dr Clent Ridges in 2 weeks.

## 2021-02-13 ENCOUNTER — Other Ambulatory Visit (INDEPENDENT_AMBULATORY_CARE_PROVIDER_SITE_OTHER): Payer: Federal, State, Local not specified - PPO

## 2021-02-13 DIAGNOSIS — R600 Localized edema: Secondary | ICD-10-CM

## 2021-02-13 LAB — COMPREHENSIVE METABOLIC PANEL
ALT: 28 U/L (ref 0–53)
AST: 29 U/L (ref 0–37)
Albumin: 3.7 g/dL (ref 3.5–5.2)
Alkaline Phosphatase: 68 U/L (ref 39–117)
BUN: 22 mg/dL (ref 6–23)
CO2: 28 mEq/L (ref 19–32)
Calcium: 9.2 mg/dL (ref 8.4–10.5)
Chloride: 104 mEq/L (ref 96–112)
Creatinine, Ser: 1.36 mg/dL (ref 0.40–1.50)
GFR: 49.56 mL/min — ABNORMAL LOW (ref >60.00)
Glucose, Bld: 81 mg/dL (ref 70–99)
Potassium: 4.6 mEq/L (ref 3.5–5.1)
Sodium: 138 mEq/L (ref 135–145)
Total Bilirubin: 0.5 mg/dL (ref 0.2–1.2)
Total Protein: 6.9 g/dL (ref 6.0–8.3)

## 2021-02-13 LAB — MICROALBUMIN / CREATININE URINE RATIO
Creatinine,U: 54.2 mg/dL
Microalb Creat Ratio: 1.3 mg/g (ref 0.0–30.0)
Microalb, Ur: <0.7 mg/dL (ref 0.0–1.9)

## 2021-02-13 LAB — BRAIN NATRIURETIC PEPTIDE: Pro B Natriuretic peptide (BNP): 23 pg/mL (ref 0.0–100.0)

## 2021-02-14 ENCOUNTER — Telehealth: Payer: Self-pay

## 2021-02-14 NOTE — Telephone Encounter (Signed)
Pt came by the office to pick up a copy of his lab results

## 2021-02-17 ENCOUNTER — Telehealth: Payer: Self-pay | Admitting: Family Medicine

## 2021-02-17 NOTE — Telephone Encounter (Signed)
Pt is calling and would like someone to call him and go over the blood work results

## 2021-02-18 NOTE — Telephone Encounter (Signed)
Called patient, unable to leave VM due to mail box is full.   Lab results were discussed with patient on 02/13/21, pt picked up results on 02/14/21.     Per lab result message, patient is to follow up with Dr. Clent Ridges in a couple of weeks.

## 2021-02-27 ENCOUNTER — Other Ambulatory Visit: Payer: Self-pay | Admitting: Family Medicine

## 2021-03-09 HISTORY — PX: TRANSTHORACIC ECHOCARDIOGRAM: SHX275

## 2021-03-12 ENCOUNTER — Ambulatory Visit (HOSPITAL_COMMUNITY): Payer: Federal, State, Local not specified - PPO | Attending: Cardiology

## 2021-03-18 ENCOUNTER — Other Ambulatory Visit: Payer: Self-pay

## 2021-03-18 ENCOUNTER — Ambulatory Visit (HOSPITAL_COMMUNITY): Payer: Federal, State, Local not specified - PPO | Attending: Cardiology

## 2021-03-18 DIAGNOSIS — R600 Localized edema: Secondary | ICD-10-CM | POA: Diagnosis not present

## 2021-03-18 LAB — ECHOCARDIOGRAM COMPLETE
Area-P 1/2: 2.63 cm2
S' Lateral: 3.5 cm

## 2021-03-24 ENCOUNTER — Other Ambulatory Visit (HOSPITAL_COMMUNITY): Payer: Federal, State, Local not specified - PPO

## 2021-03-24 ENCOUNTER — Encounter: Payer: Self-pay | Admitting: Family Medicine

## 2021-03-25 ENCOUNTER — Telehealth: Payer: Self-pay | Admitting: Family Medicine

## 2021-03-25 NOTE — Telephone Encounter (Signed)
Sent Dr Clent Ridges message to pt via MyChart, will try call pt in the morning

## 2021-03-25 NOTE — Telephone Encounter (Signed)
His ECHO shows some mild congestive heart failure. The Furosemide he was given should help. Set up an OV with me to go over all this and make a plan

## 2021-03-25 NOTE — Telephone Encounter (Signed)
Awaiting message from 03/18/21. Echo results

## 2021-03-25 NOTE — Telephone Encounter (Signed)
Pt is calling and would like the results of echocardiogram he had on 03-18-2021. Pt was referred by dr Elease Hashimoto

## 2021-03-25 NOTE — Telephone Encounter (Signed)
Called pt not able to leave a message due to message mailbox being full, will tray call pt later

## 2021-03-27 NOTE — Telephone Encounter (Signed)
Patient called to get results. I let patient know that Dr.Fry and Cma were gone for the day and would be back in tomorrow. I let him know that CMA would give him a call sometime when they were available.

## 2021-03-28 NOTE — Telephone Encounter (Signed)
Spoke with pt reviewed Echo results per Dr Clent Ridges, pt scheduled for OV on 04/02/2021 at 3 pm per Dr Clent Ridges

## 2021-04-02 ENCOUNTER — Ambulatory Visit: Payer: Federal, State, Local not specified - PPO | Admitting: Family Medicine

## 2021-04-02 ENCOUNTER — Encounter: Payer: Self-pay | Admitting: Family Medicine

## 2021-04-02 VITALS — BP 120/70 | HR 52 | Temp 99.1°F | Wt 216.0 lb

## 2021-04-02 DIAGNOSIS — N1831 Chronic kidney disease, stage 3a: Secondary | ICD-10-CM | POA: Insufficient documentation

## 2021-04-02 DIAGNOSIS — I5042 Chronic combined systolic (congestive) and diastolic (congestive) heart failure: Secondary | ICD-10-CM | POA: Diagnosis not present

## 2021-04-02 MED ORDER — POTASSIUM CHLORIDE ER 10 MEQ PO TBCR: 10.0000 meq | EXTENDED_RELEASE_TABLET | Freq: Every day | ORAL | 5 refills | Status: DC

## 2021-04-02 MED ORDER — FUROSEMIDE 40 MG PO TABS: 40.0000 mg | ORAL_TABLET | Freq: Every day | ORAL | 5 refills | Status: DC

## 2021-04-02 NOTE — Progress Notes (Signed)
° °  Subjective:    Patient ID: Brian Bond, male    DOB: Mar 31, 1940, 81 y.o.   MRN: [id number]  HPI Here to follow up on leg edema. He saw Dr. Elease Hashimoto for this on 02-12-21 after he noticed his legs swelling and he had a 20 lb weight gain over a 2 month period. He denies any chest pain or SOB. That day his labs revealed a GFR of 49.56 with a normal creatinine. He also had an ECHO on 03-18-21 that showed th LVEF to be 45-50%. The LV had global hypokinesis, and there was a Grade II diastolic dysfunction. He was started on Lasix 40 mg a day, and this has helped. He says the swelling is down a little from what it was. He has never had HTN, but he was a smoker until he quit in 1993.    Review of Systems  Constitutional: Negative.   Respiratory: Negative.    Cardiovascular:  Positive for leg swelling. Negative for chest pain and palpitations.      Objective:   Physical Exam Constitutional:      Appearance: Normal appearance. He is not ill-appearing.  Cardiovascular:     Rate and Rhythm: Normal rate and regular rhythm.     Pulses: Normal pulses.     Heart sounds: Normal heart sounds.  Pulmonary:     Effort: Pulmonary effort is normal.     Breath sounds: Normal breath sounds.  Musculoskeletal:     Comments: 1+ edema in both lower legs   Neurological:     Mental Status: He is alert.          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 He will stay on Lasix 40 mg daily as well as potassium. We will refer him to Cardiology for further evaluation. We spent a total of ( 32  ) minutes reviewing records and discussing these issues.  Alysia Penna, MD

## 2021-04-19 NOTE — Assessment & Plan Note (Addendum)
Mildly reduced EF on echo with grade 2 diastolic function noted but normal left atrial size.-Symptoms of worsening edema, DOE but not necessarily PND orthopnea.  Swelling notably improved with addition of diuretic and wearing support stockings possible elevation.  Plan:  For now continue current dose of diuretic.  But would likely need to consider adding afterload reduction with low-dose ARB plus or minus spironolactone in order to combat hypokalemia.  No beta-blocker because of significant bradycardia.  Need to explain the reason for reduced EF and new onset CHF symptoms.-Exclude ischemic etiology  Myoview Stress Test

## 2021-04-19 NOTE — Progress Notes (Signed)
Primary Care Provider: Laurey Morale, MD [facility] Cardiologist: None Electrophysiologist: None  Clinic Note: Chief Complaint  Patient presents with   New Patient (Initial Visit)    New diagnosis of mild HFmrEF; 20 lb wgt gain with edema, DOE & CP   ===================================  ASSESSMENT/PLAN   Problem List Items Addressed This Visit       Cardiology Problems   Varicose veins of leg with edema, bilateral    Clearly has the potential for edema. Discussed importance of foot elevation and wearing support stockings. Continue diuretic, but would prefer to try to avoid chronic use of diuretic if not necessary.      Relevant Orders   EKG 12-Lead (Completed)   VAS [place] LOWER EXTREMITY VENOUS REFLUX   Chronic combined systolic and diastolic congestive heart failure (HCC) - Primary (Chronic)    Mildly reduced EF on echo with grade 2 diastolic function noted but normal left atrial size.-Symptoms of worsening edema, DOE but not necessarily PND orthopnea.  Swelling notably improved with addition of diuretic and wearing support stockings possible elevation.  Plan: For now continue current dose of diuretic.  But would likely need to consider adding afterload reduction with low-dose ARB plus or minus spironolactone in order to combat hypokalemia. No beta-blocker because of significant bradycardia. Need to explain the reason for reduced EF and new onset CHF symptoms.-Exclude ischemic etiology Myoview Stress Test       Relevant Orders   EKG 12-Lead (Completed)   MYOCARDIAL PERFUSION IMAGING   Cardiac Stress Test: Informed Consent Details: Physician/Practitioner Attestation; Transcribe to consent form and obtain patient signature     Other   Chest pain with moderate risk for cardiac etiology    With the new onset of edema and worsening EF, would like to exclude ischemia.  He also had an episode of chest pain that woke him up from sleep.  Seems somewhat atypical because it  was relatively short-lived and after the reduction in EF.  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      Relevant Orders   EKG 12-Lead (Completed)   Other Visit Diagnoses     Precordial pain       Relevant Orders   MYOCARDIAL PERFUSION IMAGING   Cardiac Stress Test: Informed Consent Details: Physician/Practitioner Attestation; Transcribe to consent form and obtain patient signature      ===================================  HPI:    Brian Bond is a 81 y.o. male who is being seen today for the evaluation of Brian Bond at the request of Laurey Morale, MD.  [name] was just seen on April 02, 2021 by Dr. Sarajane Jews in follow-up after initial visit in December for worsening edema and weight gain over 2 months.  Echo revealed EF 45 to 50% with global HK and GRII DD.  He was started on 40 mg Lasix at the time the results were read -which did seem to improve  edema some.   BNP was 23 .   Referred to cardiology.  Recent Hospitalizations:  none, right  Reviewed  CV studies:    The following studies were reviewed today: (if available, images/films reviewed: From Epic Chart or Care Everywhere) 2D Echo 03/18/2021: EF 45 to 50% with global HK.  Mild septal LVH.  GRII DD?  But normal LA size (does not correlate)  Interval History:   THELMON Bond presents noting that starting about Thanksgiving he noted that his  weight went up from 205 to 227 pounds.  He noted pretty significant swelling in his legs.  Right was definitely worse than the left.  He started elevating his legs and started wearing compression stockings as well as taking the furosemide and the swelling is significantly improved.  His weight is down to about 208 pounds at home.  He is not quite down to where he should be.  Also, about a month ago a had a prolonged episode of chest discomfort/pressure that lasted a minute or 2 while sleeping.  This scared him as it was associated with some shortness of breath and it woke him up at night.  Otherwise no further chest pain.  He does have some mild skin discoloration lower legs with some swollen varicose veins with the right being worse than the left.  He is always had a little bit of swelling, but nothing like he had back in Thanksgiving.  He did not notice anything different as far as what he had been eating.  (But cannot be sure)  CV Review of Symptoms (Summary) Cardiovascular ROS: positive for - chest pain, edema, and initial wgt gain 20 lb - now back to normal negative for - dyspnea on exertion, irregular heartbeat, loss of consciousness, orthopnea, palpitations, paroxysmal nocturnal dyspnea, rapid heart rate, shortness of breath, or syncope/near syncope or TIA/amaurosis fugax, claudication  REVIEWED OF SYSTEMS   Review of Systems  Constitutional:  Positive for malaise/fatigue (Has slowed down quite a bit) and weight loss (His weight is gone up about 20 pounds he lost it back with diuresis.).  HENT:  Negative for congestion and nosebleeds.   Respiratory:  Positive for shortness of breath.   Cardiovascular:  Positive for leg swelling.       Per HPI  Gastrointestinal:  Negative for blood in stool and melena.  Genitourinary:  Negative for hematuria.  Musculoskeletal:  Negative for joint pain.  Neurological:  Positive for dizziness (A little bit with change in position and standing up quickly.).  Negative for weakness.  Psychiatric/Behavioral:  Positive for memory loss. Negative for depression. The patient has insomnia. The patient is not nervous/anxious.    I have reviewed and (if needed) personally updated the patient's problem list, medications, allergies, past medical and surgical history, social and family history.   PAST MEDICAL HISTORY   Past Medical History:  Diagnosis Date   Bell's palsy 2014   BPH (benign prostatic hyperplasia)    Diverticula, colon    Edema    Insomnia    Low back pain    Osteoarthritis    Skin rash 11/2019   hands waist neck scalp rash and skin peeling    PAST SURGICAL HISTORY   Past Surgical History:  Procedure Laterality Date   broken leg     right leg   COLONOSCOPY  05/09/2013   per Dr. Fuller Plan, clear, no repeats needed    FRACTURE SURGERY Right [place]  01/08/2020   per Dr. Rodman Key  Gay, Holmium laser enucleation with morcellation    Immunization History  Administered Date(s) Administered   Fluad Quad(high Dose 65+) 11/26/2020   PFIZER(Purple Top)SARS-COV-2 Vaccination 06/29/2019, 07/24/2019   Pneumococcal Conjugate-13 11/26/2020    MEDICATIONS/ALLERGIES   Current Meds  Medication Sig   furosemide (LASIX) 40 MG tablet Take 1 tablet (40 mg total) by mouth daily.   MULTIPLE VITAMIN PO Take by mouth.   potassium chloride (KLOR-CON 10) 10 MEQ tablet Take 1 tablet (10 mEq total) by mouth daily.   temazepam (RESTORIL) 30 MG capsule TAKE 1 CAPSULE BY MOUTH EVERY NIGHT AT BEDTIME AS NEEDED    No Known Allergies  SOCIAL HISTORY/FAMILY HISTORY   Reviewed in Epic:   Social History   Tobacco Use   Smoking status: Former    Packs/day: 0.50    Years: 5.00    Pack years: 2.50    Types: Cigarettes    Quit date: 05/03/1991    Years since quitting: 30.0   Smokeless tobacco: Never  Vaping Use   Vaping Use: Never used  Substance Use Topics   Alcohol use: Yes    Alcohol/week: 4.0 standard drinks    Types: 4 Cans  of beer per week    Comment: occ   Drug use: No   Social History   Social History Narrative   Not on file   Family History  Problem Relation Age of Onset   Breast cancer Sister    Hypertension Other        family hx    OBJCTIVE -PE, EKG, labs   Wt Readings from Last 3 Encounters:  04/23/21 214 lb 9.6 oz (97.3 kg)  04/02/21 216 lb (98 kg)  02/12/21 224 lb 11.2 oz (101.9 kg)    Physical Exam: BP 119/76    Pulse (!) 46    Ht 6\' 5"  (1.956 m)    Wt 214 lb 9.6 oz (97.3 kg)    SpO2 99%    BMI 25.45 kg/m  Physical Exam Vitals reviewed.  Constitutional:      General: He is not in acute distress.    Appearance: Normal appearance. He is normal weight. He is not ill-appearing or toxic-appearing.     Comments: Somewhat frail elderly gentleman.  No obvious distress.  HENT:     Head: Normocephalic and atraumatic.     Ears:     Comments: Very hard of hearing Neck:     Vascular: No carotid bruit or JVD.  Cardiovascular:     Rate and Rhythm: Regular rhythm. Bradycardia present. No extrasystoles are present.    Chest Wall: PMI is not displaced.     Pulses: Decreased pulses (Diminished with palpable pedal pulses.).     Heart sounds: S1 normal and S2 normal. Murmur (Cannot exclude soft SEM at RUSB.) heard.    No friction rub. No gallop.  Musculoskeletal:        General: Swelling (No real notable swelling, but mild stasis dermatitis changes. R>L) present. Normal range of motion.     Cervical back: Normal range of motion and neck supple.  Skin:    General: Skin is warm and dry.     Comments: BLE (R>L venous stasis changes)  Neurological:     General: No focal deficit present.     Mental Status: He is alert and oriented to person, place, and time.     Motor: No weakness.     Gait: Gait abnormal (Somewhat slow, deliberate).  Psychiatric:  Mood and Affect: Mood normal.        Behavior: Behavior normal.     Comments: Relatively poor historian.    Adult ECG Report  Rate: 46 ;   Rhythm: sinus bradycardia and nonspecific ST and T wave changes.  Otherwise normal axis, intervals and durations. ;   Narrative Interpretation: Abnormal because of bradycardia.  Recent Labs: Reviewed 02/13/2021-BNP 23 Lab Results  Component Value Date   CHOL 164 11/26/2020   HDL 62.00 11/26/2020   LDLCALC 96 11/26/2020   TRIG 31.0 11/26/2020   CHOLHDL 3 11/26/2020   Lab Results  Component Value Date   CREATININE 1.36 02/13/2021   BUN 22 02/13/2021   NA 138 02/13/2021   K 4.6 02/13/2021   CL 104 02/13/2021   CO2 28 02/13/2021   CBC Latest Ref Rng & Units 11/26/2020 05/16/2020 01/09/2020  WBC 4.0 - 10.5 K/uL 4.3 6.0 7.1  Hemoglobin 13.0 - 17.0 g/dL 14.6 14.6 13.0  Hematocrit 39.0 - 52.0 % 45.0 45.5 38.5(L)  Platelets 150.0 - 400.0 K/uL 203.0 217 176    Lab Results  Component Value Date   HGBA1C 5.8 11/26/2020   Lab Results  Component Value Date   TSH 1.31 11/26/2020    ==================================================  COVID-19 Education: The signs and symptoms of COVID-19 were discussed with the patient and how to seek care for testing (follow up with PCP or arrange E-visit).    I spent a total of 33 minutes with the patient spent in direct patient consultation.  Additional time spent with chart review  / charting (studies, outside notes, etc): 35 min Total Time: 68 min  Current medicines are reviewed at length with the patient today.  (+/- concerns) n/a  This visit occurred during the SARS-CoV-2 public health emergency.  Safety protocols were in place, including screening questions prior to the visit, additional usage of staff PPE, and extensive cleaning of exam room while observing appropriate contact time as indicated for disinfecting solutions.  Notice: This dictation was prepared with Dragon dictation along with smart phrase technology. Any transcriptional errors that result from this process are unintentional and may not be corrected upon review.   Studies  Ordered:  Orders Placed This Encounter  Procedures   Cardiac Stress Test: Informed Consent Details: Physician/Practitioner Attestation; Transcribe to consent form and obtain patient signature   MYOCARDIAL PERFUSION IMAGING   EKG 12-Lead   VAS [place] LOWER EXTREMITY VENOUS REFLUX    Patient Instructions / Medication Changes & Studies & Tests Ordered   Shared Decision Making/Informed Consent The risks [chest pain, shortness of breath, cardiac arrhythmias, dizziness, blood pressure fluctuations, myocardial infarction, stroke/transient ischemic attack, nausea, vomiting, allergic reaction, radiation exposure, metallic taste sensation and life-threatening complications (estimated to be 1 in 10,000)], benefits (risk stratification, diagnosing coronary artery disease, treatment guidance) and alternatives of a nuclear stress test were discussed in detail with Mr. Areola and he agrees to proceed.   Patient Instructions  Medication Instructions:  No changes  *If you need a refill on your cardiac medications before your next appointment, please call your pharmacy*   Lab Work: Not needed  Testing/Procedures: Will be schedule at  [place] has requested that you have en exercise stress myoview. Please follow instruction sheet, as given. .  And  Will be schedule at [place] has requested that you have a lower  extremity venous duplex  for Reflux and DVT. This test is an  ultrasound of the veins in the legs. It looks at venous blood flow that carries blood from the heart to the legs. Allow one hour for a Lower Venous exam. There are no restrictions or special instructions.    Follow-Up: At [facility], you and your health needs are our priority.  As part of our continuing mission to provide you with exceptional heart care, we have created designated Provider Care Teams.  These Care Teams include your primary Cardiologist  (physician) and Advanced Practice Providers (APPs -  Physician Assistants and Nurse Practitioners) who all work together to provide you with the care you need, when you need it.     Your next appointment:   2 to 3  month(s)  The format for your next appointment:   In Person  Provider:   None    Other Instructions      Glenetta Hew, M.D., M.S. Interventional Cardiologist   Pager # [phone] Phone # [phone] [address] [place], [place] [address]   Thank you for choosing Heartcare at [facility]!!

## 2021-04-23 ENCOUNTER — Ambulatory Visit: Payer: Federal, State, Local not specified - PPO | Admitting: Cardiology

## 2021-04-23 ENCOUNTER — Encounter: Payer: Self-pay | Admitting: Cardiology

## 2021-04-23 ENCOUNTER — Other Ambulatory Visit: Payer: Self-pay

## 2021-04-23 VITALS — BP 119/76 | HR 46 | Ht 77.0 in | Wt 214.6 lb

## 2021-04-23 DIAGNOSIS — R072 Precordial pain: Secondary | ICD-10-CM

## 2021-04-23 DIAGNOSIS — I5042 Chronic combined systolic (congestive) and diastolic (congestive) heart failure: Secondary | ICD-10-CM

## 2021-04-23 DIAGNOSIS — R079 Chest pain, unspecified: Secondary | ICD-10-CM

## 2021-04-23 DIAGNOSIS — N1831 Chronic kidney disease, stage 3a: Secondary | ICD-10-CM

## 2021-04-23 DIAGNOSIS — I83893 Varicose veins of bilateral lower extremities with other complications: Secondary | ICD-10-CM

## 2021-04-23 HISTORY — DX: Chest pain, unspecified: R07.9

## 2021-04-23 NOTE — Patient Instructions (Addendum)
Medication Instructions:  No changes  *If you need a refill on your cardiac medications before your next appointment, please call your pharmacy*   Lab Work: Not needed  Testing/Procedures: Will be schedule at  [place] has requested that you have en exercise stress myoview. Please follow instruction sheet, as given. .  And  Will be schedule at [place] has requested that you have a lower  extremity venous duplex  for Reflux and DVT. This test is an ultrasound of the veins in the legs. It looks at venous blood flow that carries blood from the heart to the legs. Allow one hour for a Lower Venous exam. There are no restrictions or special instructions.    Follow-Up: At [facility], you and your health needs are our priority.  As part of our continuing mission to provide you with exceptional heart care, we have created designated Provider Care Teams.  These Care Teams include your primary Cardiologist (physician) and Advanced Practice Providers (APPs -  Physician Assistants and Nurse Practitioners) who all work together to provide you with the care you need, when you need it.     Your next appointment:   2 to 3  month(s)  The format for your next appointment:   In Person  Provider:   None    Other Instructions

## 2021-04-24 ENCOUNTER — Telehealth: Payer: Self-pay

## 2021-04-24 NOTE — Telephone Encounter (Signed)
Spoke with the patient, detailed instructions given. He stated that he would be here for his test. Asked to call back with any questions. S.Maddox Hlavaty EMTP 

## 2021-04-26 ENCOUNTER — Encounter: Payer: Self-pay | Admitting: Cardiology

## 2021-04-26 NOTE — Assessment & Plan Note (Signed)
With chronic kidney disease and the need for diuresis and possible ARB, would prefer to avoid contrast use in Coronary CTA, therefore we will have decided to use Lexiscan Myoview for ischemic evaluation.

## 2021-04-26 NOTE — Assessment & Plan Note (Signed)
Clearly has the potential for edema. Discussed importance of foot elevation and wearing support stockings. Continue diuretic, but would prefer to try to avoid chronic use of diuretic if not necessary.

## 2021-04-26 NOTE — Assessment & Plan Note (Signed)
With the new onset of edema and worsening EF, would like to exclude ischemia.  He also had an episode of chest pain that woke him up from sleep.  Seems somewhat atypical because it was relatively short-lived and after the reduction in EF.  It however it is possible that he [place] spells that he cannot recall (he is a pretty bad historian)  Plan: [employer]

## 2021-04-29 ENCOUNTER — Other Ambulatory Visit: Payer: Self-pay

## 2021-04-29 ENCOUNTER — Ambulatory Visit (HOSPITAL_COMMUNITY): Payer: Federal, State, Local not specified - PPO | Attending: Cardiology

## 2021-04-29 DIAGNOSIS — R079 Chest pain, unspecified: Secondary | ICD-10-CM | POA: Diagnosis not present

## 2021-04-29 DIAGNOSIS — I5042 Chronic combined systolic (congestive) and diastolic (congestive) heart failure: Secondary | ICD-10-CM | POA: Insufficient documentation

## 2021-04-29 DIAGNOSIS — R072 Precordial pain: Secondary | ICD-10-CM | POA: Insufficient documentation

## 2021-04-29 DIAGNOSIS — R9439 Abnormal result of other cardiovascular function study: Secondary | ICD-10-CM | POA: Insufficient documentation

## 2021-04-29 HISTORY — PX: NM MYOVIEW LTD: HXRAD82

## 2021-04-29 LAB — MYOCARDIAL PERFUSION IMAGING
Estimated workload: 4.6
Exercise duration (min): 4 min
Exercise duration (sec): 30 s
LV dias vol: 136 mL (ref 62–150)
LV sys vol: 77 mL
MPHR: 141 {beats}/min
Nuc Stress EF: 43 %
Peak HR: 134 {beats}/min
Percent HR: 95 %
Rest HR: 76 {beats}/min
Rest Nuclear Isotope Dose: 10.2 mCi
SDS: 0
SRS: 0
SSS: 0
ST Depression (mm): 0 mm
Stress Nuclear Isotope Dose: 31.6 mCi
TID: 0.99

## 2021-04-29 MED ORDER — TECHNETIUM TC 99M TETROFOSMIN IV KIT
31.6000 | PACK | Freq: Once | INTRAVENOUS | Status: AC | PRN
  Administered 2021-04-29: 31.6 via INTRAVENOUS
  Filled 2021-04-29: qty 32

## 2021-04-29 MED ORDER — TECHNETIUM TC 99M TETROFOSMIN IV KIT
10.2000 | PACK | Freq: Once | INTRAVENOUS | Status: AC | PRN
  Administered 2021-04-29: 10.2 via INTRAVENOUS
  Filled 2021-04-29: qty 11

## 2021-04-29 NOTE — Progress Notes (Signed)
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  As such, this suggests reduced pump function. We can discuss this in follow-up, but I think this may be something that, if symptoms warrant, we may want to consider doing a heart catheterization to confirm or deny if there is indeed a significant blockage or totally closed artery.  The finding is not glaring, but it is concerning to give a reason for reduced pump function.  I do not think this is up that needs to be rushed, but we can discuss in clinic when he comes back  Bryan Lemma, MD

## 2021-05-05 ENCOUNTER — Ambulatory Visit (HOSPITAL_COMMUNITY)
Admission: RE | Admit: 2021-05-05 | Payer: Federal, State, Local not specified - PPO | Source: Ambulatory Visit | Attending: Cardiology | Admitting: Cardiology

## 2021-05-05 ENCOUNTER — Encounter (HOSPITAL_COMMUNITY): Payer: Self-pay

## 2021-05-06 ENCOUNTER — Telehealth: Payer: Self-pay | Admitting: *Deleted

## 2021-05-06 NOTE — Telephone Encounter (Signed)
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  As such, this suggests reduced pump function. We can discuss this in follow-up, but I think this may be something that, if symptoms warrant, we may want to consider doing a heart catheterization to confirm or deny if there is indeed a significant blockage or totally closed artery.  The finding is not glaring, but it is concerning to give a reason for reduced pump function.  I do not think this is up that needs to be rushed, but we can discuss in clinic when he comes back  Bryan Lemma, MD

## 2021-05-06 NOTE — Telephone Encounter (Signed)
RN discussed with Dr Herbie Baltimore. Dr Herbie Baltimore would like for patient's appointment to be  moved a little sooner into April .  Left message for patient to call back, will need to give result and moved  May 2023 appointment to April after doppler on 06/02/21.

## 2021-05-15 NOTE — Telephone Encounter (Signed)
Patient reviewed  mychart on 05/08/21. ? Called no answer - left message on patient voicemail- appointment made for 06/09/21 at 11 am. ? If time is not suitable patient can call back to reschedule ?  Message left that Dr Herbie Baltimore would like to discuss test results  sooner than MAY 2023 ?

## 2021-06-02 ENCOUNTER — Ambulatory Visit (HOSPITAL_COMMUNITY)
Admission: RE | Admit: 2021-06-02 | Discharge: 2021-06-02 | Disposition: A | Discharge status: Home / Self Care | Payer: Federal, State, Local not specified - PPO | Source: Ambulatory Visit | Attending: Cardiovascular Disease | Admitting: Cardiovascular Disease

## 2021-06-02 ENCOUNTER — Other Ambulatory Visit: Payer: Self-pay | Admitting: Cardiology

## 2021-06-02 ENCOUNTER — Telehealth: Payer: Self-pay | Admitting: *Deleted

## 2021-06-02 ENCOUNTER — Other Ambulatory Visit: Payer: Self-pay

## 2021-06-02 DIAGNOSIS — R2243 Localized swelling, mass and lump, lower limb, bilateral: Secondary | ICD-10-CM

## 2021-06-02 DIAGNOSIS — I82411 Acute embolism and thrombosis of right femoral vein: Secondary | ICD-10-CM

## 2021-06-02 DIAGNOSIS — I83893 Varicose veins of bilateral lower extremities with other complications: Secondary | ICD-10-CM

## 2021-06-02 HISTORY — DX: Acute embolism and thrombosis of right femoral vein: I82.411

## 2021-06-02 MED ORDER — APIXABAN 5 MG PO TABS: ORAL_TABLET | ORAL | 0 refills | Status: DC

## 2021-06-02 NOTE — Telephone Encounter (Signed)
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 ? ? Per Dr Herbie Baltimore, start  Eliquis  10 mg twice a day x 7 days then decrease to 5 mg twice a day. ? Per Dr Herbie Baltimore ,set up  an appointment for March 28,2023 at 10:20 am  ? ?RN informed patient ,Dr Herbie Baltimore would like to start Eliquis  for treatment for DVT.  14 tablet Samples  box given. Instruction was given on how to take medication tonight.   ? ?Patient  aware of appointment appointment 05/06/21 with Dr Herbie Baltimore and verbalized understanding instructions of medication . ?

## 2021-06-03 ENCOUNTER — Ambulatory Visit: Payer: Federal, State, Local not specified - PPO | Admitting: Cardiology

## 2021-06-03 ENCOUNTER — Encounter: Payer: Self-pay | Admitting: Cardiology

## 2021-06-03 VITALS — BP 118/64 | HR 72 | Ht 77.0 in | Wt 212.0 lb

## 2021-06-03 DIAGNOSIS — R9439 Abnormal result of other cardiovascular function study: Secondary | ICD-10-CM

## 2021-06-03 DIAGNOSIS — I5042 Chronic combined systolic (congestive) and diastolic (congestive) heart failure: Secondary | ICD-10-CM | POA: Diagnosis not present

## 2021-06-03 DIAGNOSIS — I83893 Varicose veins of bilateral lower extremities with other complications: Secondary | ICD-10-CM | POA: Diagnosis not present

## 2021-06-03 DIAGNOSIS — I82513 Chronic embolism and thrombosis of femoral vein, bilateral: Secondary | ICD-10-CM | POA: Insufficient documentation

## 2021-06-03 DIAGNOSIS — R079 Chest pain, unspecified: Secondary | ICD-10-CM

## 2021-06-03 DIAGNOSIS — N1831 Chronic kidney disease, stage 3a: Secondary | ICD-10-CM

## 2021-06-03 MED ORDER — APIXABAN 5 MG PO TABS: 5.0000 mg | ORAL_TABLET | Freq: Two times a day (BID) | ORAL | 3 refills | Status: DC

## 2021-06-03 NOTE — Assessment & Plan Note (Signed)
He had that 1 episode of chest discomfort that woke him out of sleep, but not mother.  I do not think that is the infarct noted on his Myoview, because I think this happened after the onset of his symptoms.  It is possible however that he has had missed inferior infarct.  I think it is either a silent MI, or gradual progression of disease. ? ?He is no longer having chest pain of any type, and exertional dyspnea does not seem to let significant either.  We talked about what steps to take next.  With his borderline renal insufficiency, we are trying to avoid contrast, in which case either coronary CTA or cardiac catheterization would not be overly favorable. ? ?I think for now the best option is to just take care of the DVT, then reassess in about 6 months. ?

## 2021-06-03 NOTE — Assessment & Plan Note (Signed)
I suspect the DVT that was diagnosed yesterday probably has been there since last November.  He has had unilateral swelling that seems to have propagated up the leg a little bit.  It is possible that the DVT was larger.  Its been possible in some of his brother, but he has not had any symptoms of PE. ? ?Since it seems like it is somewhat subacute and chronic I would prefer to treat with at minimum 6 months of DOAC.  We have started him on Eliquis 10 mg twice daily for 1 week and then 5 mg to twice daily afterwards. ? ?Would like to reassess with Dopplers in 6 months to see if they have thrombus is cleared.  If not, would continue for 9 to 12 months. ?After stopping DOAC, would at a minimum place him on aspirin because of his venous insufficiency. ? ?After 1 month treatment on DOAC, we can have him restart wearing support stocking on his right leg.  He can also then start actively walking and stretching with less concern.  I still want him walking between now and then but just not as significant with level of exertion and stretching. ?

## 2021-06-03 NOTE — Patient Instructions (Addendum)
?  Medication Instructions:  ? ?Continue taking Eliquis  10 mg twice a day for 7 days , then decrease to 5 mg  twice a day  ? ? Do not take  Aleve, Asprin  , Nsaids, Goody powders    ? ? ?*If you need a refill on your cardiac medications before your next appointment, please call your pharmacy* ? ? ?Lab Work: ? ?Not needed  ? ? ?Testing/Procedures: ? In 6 month  bilateral lower venous doppler  to follow up DVT- Sept 2023 ?Your physician has requested that you have a lower  extremity venous duplex _ DVT. This test is an ultrasound of the veins in the legs . It looks at venous blood flow that carries blood from the heart to the legs. Allow one hour for a Lower Venous exam.  There are no restrictions or special instructions. ?  ? ? ?Follow-Up: ?At [facility], you and your health needs are our priority.  As part of our continuing mission to provide you with exceptional heart care, we have created designated Provider Care Teams.  These Care Teams include your primary Cardiologist (physician) and Advanced Practice Providers (APPs -  Physician Assistants and Nurse Practitioners) who all work together to provide you with the care you need, when you need it. ? ?  ? ?Your next appointment:   ?6 month(s) ? ?The format for your next appointment:   ?In Person ? ?Provider:   ?None  ? ? ?Other Instructions  ? ?   Ask discussed in the visit -your stress test and echo  does show you had some injury to your heart  but without you having any symptoms like  shortness of breath or chest pain with exertion  for both.  No further testing at present time. ? Please  contact office if you notice these symptoms occurring  often - shortness of breath and chest pain with activity /exertion ?

## 2021-06-03 NOTE — Progress Notes (Signed)
? ?Primary Care Provider: Nelwyn SalisburyFry, Stephen A, MD ?Cardiologist: Bryan Lemmaavid Giavana Rooke, MD ?Electrophysiologist: None ? ?Clinic Note: ?Chief Complaint  ?Patient presents with  ? Follow-up  ?  Test results: Myoview and lower extremities Doppler  ? DVT  ?  Just diagnosed on lower extremity Dopplers  ? Cardiomyopathy  ?  Reduced EF on echocardiogram confirmed with Myoview.  Likely ischemic  ? ?=================================== ? ?ASSESSMENT/PLAN  ? ?Problem List Items Addressed This Visit   ? ?  ? Cardiology Problems  ? 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  I suspect the DVT that was diagnosed yesterday probably has been there since last November.  He has had unilateral swelling that seems to have propagated up the leg a little bit.  It is possible that the DVT was larger.  Its been possible in some of his brother, but he has not had any symptoms of PE. ? ?Since it seems like it is somewhat subacute and chronic I would prefer to treat with at minimum 6 months of DOAC.  We have started him on Eliquis 10 mg twice daily for 1 week and then 5 mg to twice daily afterwards. ? ?Would like to reassess with Dopplers in 6 months to see if they have thrombus is cleared.  If not, would continue for 9 to 12 months. ?After stopping DOAC, would at a minimum place him on aspirin because of his venous insufficiency. ? ?After 1 month treatment on DOAC, we can have him restart wearing support stocking on his right leg.  He can also then start actively walking and stretching with less concern.  I still want him walking between now and then but just not as significant with level of exertion and stretching. ?  ?  ? Relevant Medications  ? apixaban (ELIQUIS) 5 MG TABS tablet  ? Other Relevant Orders  ? VAS US LOWER EXTREMITY VENOUS (DVT)  ? 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  By Echo mild HmrEF with reduced EF confirmed by Myoview suggesting  prior inferior infarct. ?He really does not have any symptoms of PND or orthopnea and the edema seems to be worse on the right than the left.  This is probably more consistent with DVT than true edema from CHF. ?Suspect may be NYHA class I symptoms-he did have grade 2 diastolic function noted on echo, but left atrial size was normal.  Interestingly the Myoview showed mildly dilated LV. ? ?He is on standing dose of Lasix which is not unreasonable since the edema has doubly improved bilaterally-I suspect there is a component of venous stasis as well.  This may have set him up for DVT after long drive. ?Baseline bradycardia, therefore not using a beta-blocker.  Also his blood pressures are if anything borderline low.  Will hold off on afterload reduction since he is not actively having CHF symptoms. ?  ?  ? Relevant Medications  ? apixaban (ELIQUIS) 5 MG TABS tablet  ? Varicose veins of leg with edema, bilateral (Chronic)  ?  He does have history of venous stasis type symptoms and Likely has bilateral edema but the right was clearly worse than the left.  I suspect, that he had a DVT form in the right leg during his trip over Thanksgiving last year.  That exacerbated the right sided edema.  The right side is still swollen with evidence of DVT. ? ?I told him to continue wearing  his support stockings on the left leg but would hold off on the right leg for now until we have a been able to treat him for least a month with Eliquis. ?Going forward after that, he should continue to wear the support stockings. ? ?We also talked about the possibility of having as needed DOAC to use PRN for prolonged trips once he is no longer on standing dose. ?  ?  ? Relevant Medications  ? apixaban (ELIQUIS) 5 MG TABS tablet  ? Other Relevant Orders  ? VAS [place] LOWER EXTREMITY VENOUS (DVT)  ?  ? Other  ? Abnormal nuclear stress test (Chronic)  ?  This is probably the more difficult part of the visit to explain the results of this Myoview in  conjunction with the echocardiogram.  The reduced pump function can be explained by reduced function on the Myoview and a fixed perfusion defect in the inferior wall.  They did not appear to be any reversible defect to suggest ischemia.  This going along with the fact that he does not really have any exertional chest pain symptoms would mean that he probably would not benefit from further evaluation. ? ?As discussed, in an effort to avoid contrast given his CKD 3 A, would prefer to avoid Coronary CTA as well as cardiac catheterization. ? ?Plan: ?Reassess in 6 months after he has completed his DOAC.  Would not build to use aspirin until then. ?Because of bradycardia issues, we decided not to use a beta-blocker, and with renal insufficiency trying to avoid overly aggressive treatment of the blood pressure-in general. ?Would consider statin based on LDL of 96 on last check back in September 2022.  With him having cramping, I chose to hold off on doing so at this time.  Hoping that when he completes his treatment for the DVT and is able to get back more active, that will help his cramps.  => If he sees his PCP between now and then would be okay to start moderate-dose rosuvastatin-20 mg. ?  ?  ? Chest pain with moderate risk for cardiac etiology  ?  He had that 1 episode of chest discomfort that woke him out of sleep, but not mother.  I do not think that is the infarct noted on his Myoview, because I think this happened after the onset of his symptoms.  It is possible however that he has had missed inferior infarct.  I think it is either a silent MI, or gradual progression of disease. ? ?He is no longer having chest pain of any type, and exertional dyspnea does not seem to let significant either.  We talked about what steps to take next.  With his borderline renal insufficiency, we are trying to avoid contrast, in which case either coronary CTA or cardiac catheterization would not be overly favorable. ? ?I think for now  the best option is to just take care of the DVT, then reassess in about 6 months. ?  ?  ? 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 ? ?=================================== ? ?HPI:   ? ?Brian Bond is a 81 y.o. male with no notable previous medical history who presents today for 6-week follow-up to discuss results of 2 abnormal studies.Marland Kitchen ?I saw him for initial consultation on April 23, 2021 at the request of Laurey Morale, MD, who had just seen him in late January as a follow-up from her December visit with complaints of worsening edema and weight gain over 2  months-most notable after Thanksgiving travels.  He had been evaluated with echocardiogram that showed reduced EF of 45 to 50% with global hypokinesis and grade 2 diastolic function.  He was then started on 40 mg Lasix when the results were read which did have a notable improvement in the edema.  Interestingly BNP was only 23.  Because of the reduced EF he was referred to cardiology => during his initial evaluation his son helped provide most of the history apparently his symptoms began during the Thanksgiving weekend after drive all the way up to [place].  His weight went up from 205 to 227 pounds with significant swelling in his legs right side notably worse than the left.  Based on family recommendation, he started wearing compression stockings, and his PCP started him on diuretic which did help the edema, but the right leg is still worse.  By time I saw him his weight was down basically to almost baseline of 208 pounds.  There is also suggestion of a possible prolonged episode of chest discomfort lasting several minutes that woke him up from sleeping in early January.  He woke up short of breath but this was the only episode of any potential orthopnea or PND.  No further chest pain. ?Evaluate the abnormal echo I ordered a Myoview stress test (mostly to avoid contrast with borderline renal function).  I Also Ordered Lower Extremity Venous  Dopplers with plans proceed with insufficiency evaluation if no DVT noted.   ?The Myoview was completed relatively quickly, and he was called with results, with plans to go to details during follow-up visit

## 2021-06-03 NOTE — Assessment & Plan Note (Addendum)
He does have history of venous stasis type symptoms and Likely has bilateral edema but the right was clearly worse than the left.  I suspect, that he had a DVT form in the right leg during his trip over Thanksgiving last year.  That exacerbated the right sided edema.  The right side is still swollen with evidence of DVT. ? ?I told him to continue wearing his support stockings on the left leg but would hold off on the right leg for now until we have a been able to treat him for least a month with Eliquis. ?Going forward after that, he should continue to wear the support stockings. ? ?We also talked about the possibility of having as needed DOAC to use PRN for prolonged trips once he is no longer on standing dose. ?

## 2021-06-03 NOTE — Assessment & Plan Note (Signed)
This is probably the more difficult part of the visit to explain the results of this Myoview in conjunction with the echocardiogram.  The reduced pump function can be explained by reduced function on the Myoview and a fixed perfusion defect in the inferior wall.  They did not appear to be any reversible defect to suggest ischemia.  This going along with the fact that he does not really have any exertional chest pain symptoms would mean that he probably would not benefit from further evaluation. ? ?As discussed, in an effort to avoid contrast given his CKD 3 A, would prefer to avoid Coronary CTA as well as cardiac catheterization. ? ?Plan: ?? Reassess in 6 months after he has completed his DOAC.  Would not build to use aspirin until then. ?? Because of bradycardia issues, we decided not to use a beta-blocker, and with renal insufficiency trying to avoid overly aggressive treatment of the blood pressure-in general. ?? Would consider statin based on LDL of 96 on last check back in September 2022.  With him having cramping, I chose to hold off on doing so at this time.  Hoping that when he completes his treatment for the DVT and is able to get back more active, that will help his cramps.  => If he sees his PCP between now and then would be okay to start moderate-dose rosuvastatin-20 mg. ?

## 2021-06-03 NOTE — Assessment & Plan Note (Signed)
By Echo mild HmrEF with reduced EF confirmed by Myoview suggesting prior inferior infarct. ?He really does not have any symptoms of PND or orthopnea and the edema seems to be worse on the right than the left.  This is probably more consistent with DVT than true edema from CHF. ?Suspect may be NYHA class I symptoms-he did have grade 2 diastolic function noted on echo, but left atrial size was normal.  Interestingly the Myoview showed mildly dilated LV. ? ?? He is on standing dose of Lasix which is not unreasonable since the edema has doubly improved bilaterally-I suspect there is a component of venous stasis as well.  This may have set him up for DVT after long drive. ?? Baseline bradycardia, therefore not using a beta-blocker.  Also his blood pressures are if anything borderline low.  Will hold off on afterload reduction since he is not actively having CHF symptoms. ?

## 2021-06-09 ENCOUNTER — Ambulatory Visit: Payer: Federal, State, Local not specified - PPO | Admitting: Cardiology

## 2021-07-18 ENCOUNTER — Telehealth: Payer: Self-pay | Admitting: Cardiology

## 2021-07-18 NOTE — Telephone Encounter (Signed)
Patient reports chest pain mid chest to left side when bending over, when he coughs, or sneezes. He reports edema in feet. He stated he has not been taking lasix for the past 2 weeks because he goes to the bathroom all day and it is inconvenient for him. Recommended that he start taking the lasix again. Also recommended that if he has chest pain again to have someone take him to the ED or call 911. He voiced understanding. ?

## 2021-07-18 NOTE — Telephone Encounter (Signed)
Pt c/o of Chest Pain: STAT if CP now or developed within 24 hours ? ?1. Are you having CP right now?  ?No, only occurs with exertion ? ?2. Are you experiencing any other symptoms (ex. SOB, nausea, vomiting, sweating)?  ?Leg swelling ? ?3. How long have you been experiencing CP?  ?About 1 week ? ?4. Is your CP continuous or coming and going?  ?Coming and going ? ?5. Have you taken Nitroglycerin?  ?No  ?? ? ?

## 2021-07-18 NOTE — Telephone Encounter (Signed)
Even if taking Lasix every day is too much for him, taking it every other day should be enough to keep this from happening.  Chest pain does not sound cardiac in nature but the swelling is probably because not taking Lasix every day.  Try taking at least every other day ? ?Bryan Lemma, MD ? ?

## 2021-07-21 NOTE — Telephone Encounter (Signed)
LMTCB

## 2021-07-21 NOTE — Telephone Encounter (Signed)
Informed patient of Dr. Allison Quarry recommendation to take lasix every other day. Patient has not taken lasix today. Suggested for patient to take lasix today to help decrease edema, then take it every other day. Also informed patient that the chest pain he feels when bending over, coughing, or sneezing is not cardiac-related. Pt verbalized understanding. ? ?

## 2021-07-28 ENCOUNTER — Ambulatory Visit: Payer: Federal, State, Local not specified - PPO | Admitting: Cardiology

## 2021-08-16 IMAGING — DX DG CHEST 2V
3 series · 3 of 3 positions shown · non-contrast
Comparison: No prior.

CLINICAL DATA: Hiccups.

EXAM:
CHEST - 2 VIEW

[chest pa (1 of 2)]
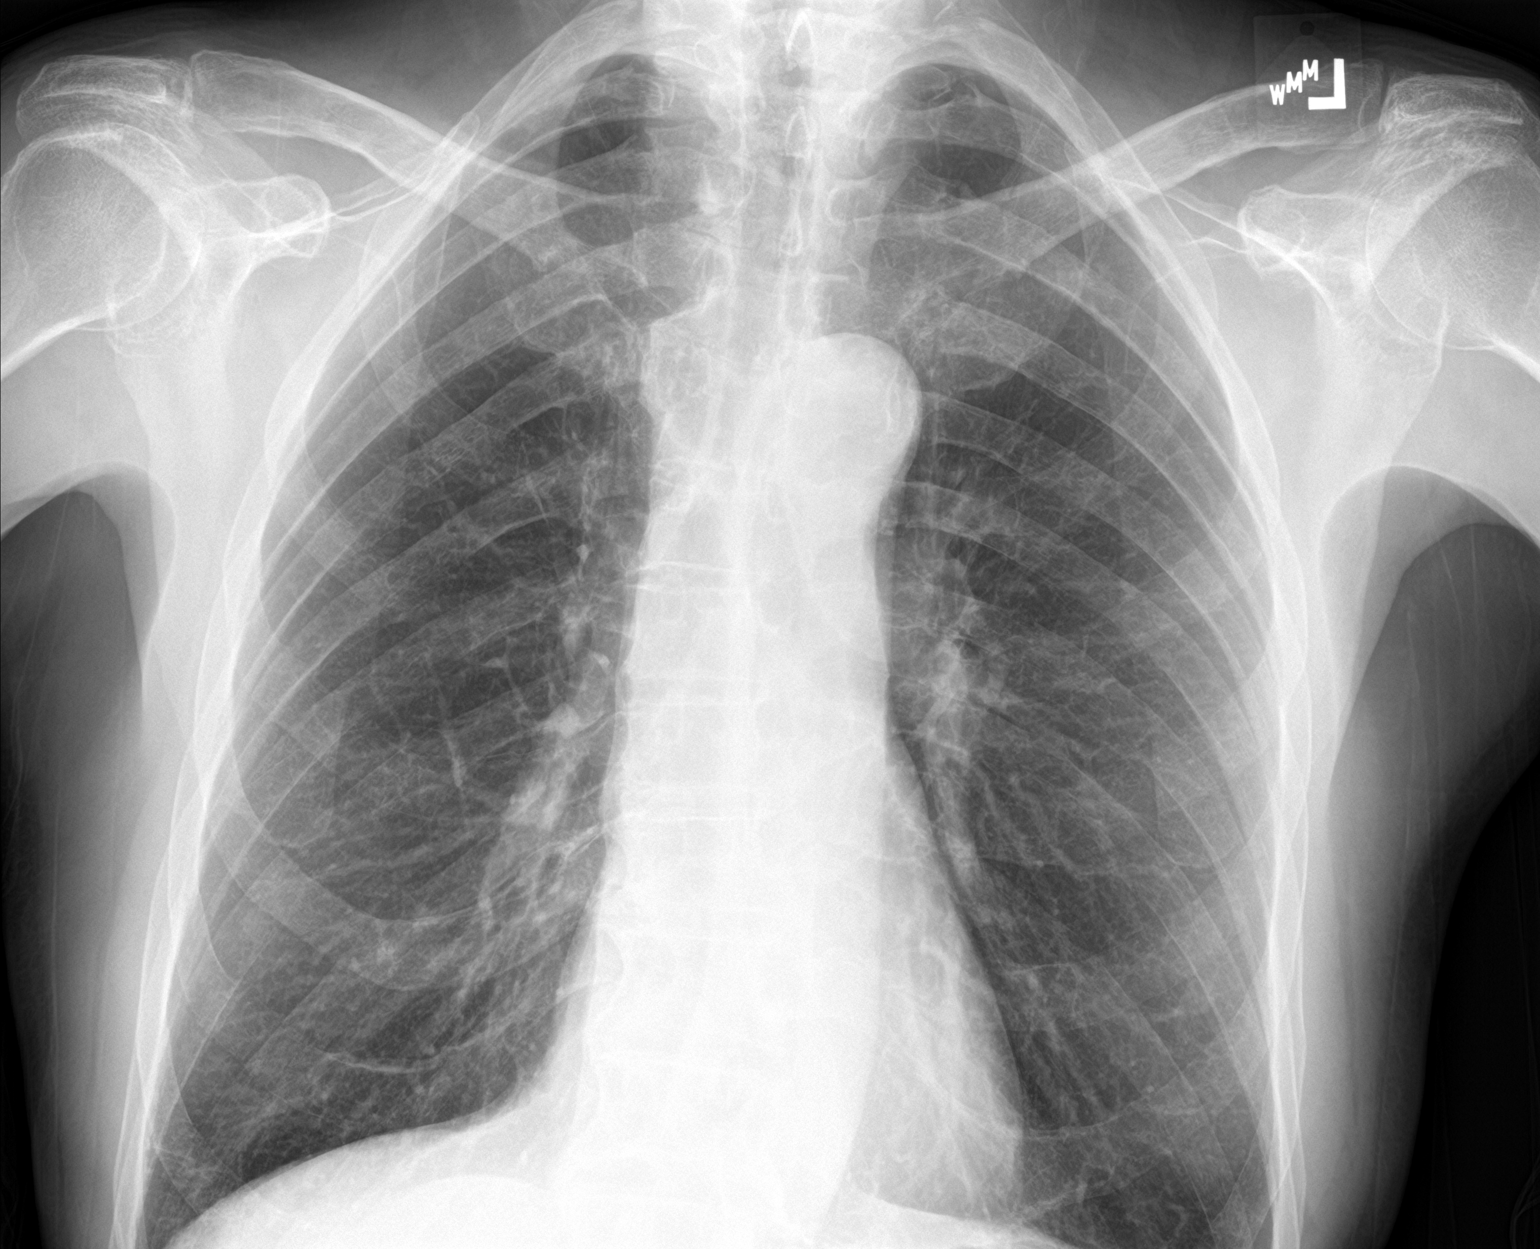

[chest lat]
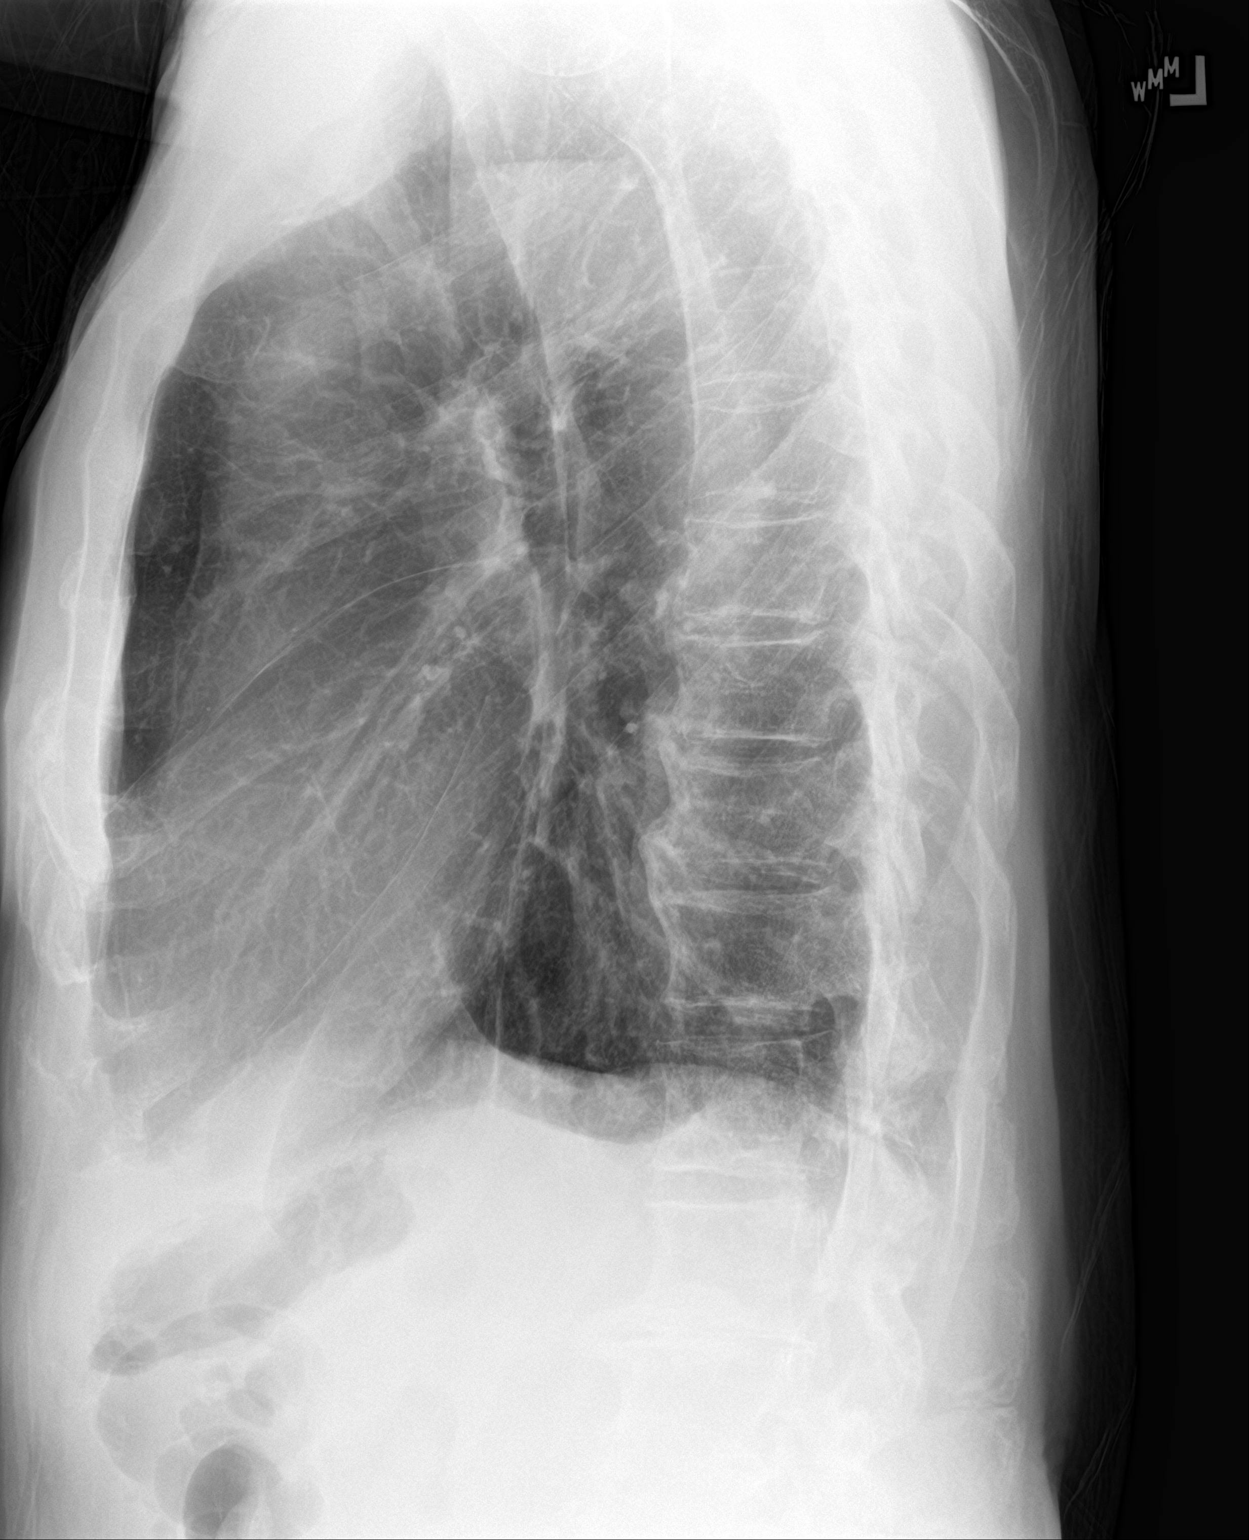

[chest pa (2 of 2)]
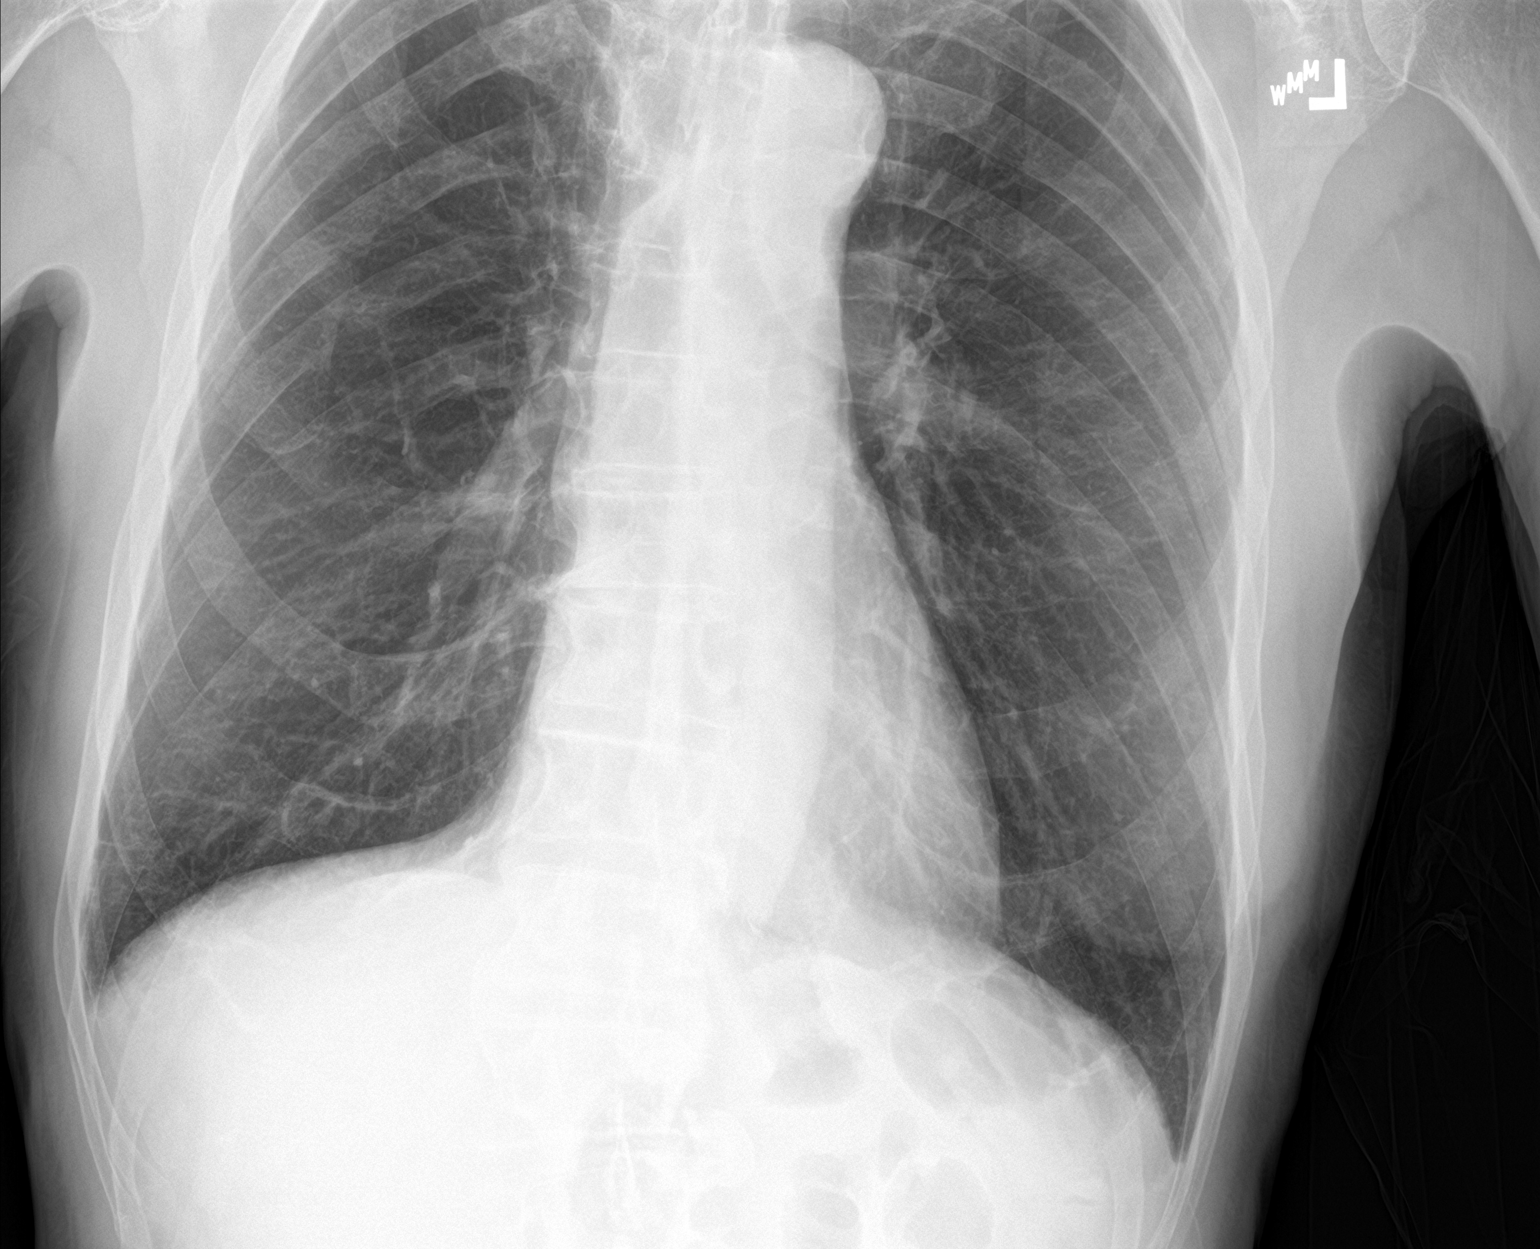

[3 of 3 positions shown; findings below may reference images not displayed]

FINDINGS: Mediastinum and hilar structures normal. Heart size normal. No focal
infiltrate. No pleural effusion or pneumothorax. Degenerative change
thoracic spine.
IMPRESSION: No active cardiopulmonary disease.

## 2021-08-18 DIAGNOSIS — R350 Frequency of micturition: Secondary | ICD-10-CM | POA: Diagnosis not present

## 2021-08-18 DIAGNOSIS — N401 Enlarged prostate with lower urinary tract symptoms: Secondary | ICD-10-CM | POA: Diagnosis not present

## 2021-08-25 DIAGNOSIS — N401 Enlarged prostate with lower urinary tract symptoms: Secondary | ICD-10-CM | POA: Diagnosis not present

## 2021-08-25 DIAGNOSIS — N5201 Erectile dysfunction due to arterial insufficiency: Secondary | ICD-10-CM | POA: Diagnosis not present

## 2021-08-25 DIAGNOSIS — R350 Frequency of micturition: Secondary | ICD-10-CM | POA: Diagnosis not present

## 2021-08-25 DIAGNOSIS — R972 Elevated prostate specific antigen [PSA]: Secondary | ICD-10-CM | POA: Diagnosis not present

## 2021-08-29 ENCOUNTER — Ambulatory Visit: Payer: Federal, State, Local not specified - PPO | Admitting: Family Medicine

## 2021-08-29 ENCOUNTER — Encounter: Payer: Self-pay | Admitting: Family Medicine

## 2021-08-29 VITALS — BP 110/70 | HR 56 | Temp 98.8°F | Wt 216.0 lb

## 2021-08-29 DIAGNOSIS — N1831 Chronic kidney disease, stage 3a: Secondary | ICD-10-CM

## 2021-08-29 DIAGNOSIS — I5042 Chronic combined systolic (congestive) and diastolic (congestive) heart failure: Secondary | ICD-10-CM | POA: Diagnosis not present

## 2021-08-29 DIAGNOSIS — R531 Weakness: Secondary | ICD-10-CM

## 2021-08-29 LAB — HEPATIC FUNCTION PANEL
ALT: 26 U/L (ref 0–53)
AST: 29 U/L (ref 0–37)
Albumin: 4.2 g/dL (ref 3.5–5.2)
Alkaline Phosphatase: 73 U/L (ref 39–117)
Bilirubin, Direct: 0.1 mg/dL (ref 0.0–0.3)
Total Bilirubin: 0.5 mg/dL (ref 0.2–1.2)
Total Protein: 8.1 g/dL (ref 6.0–8.3)

## 2021-08-29 LAB — CBC WITH DIFFERENTIAL/PLATELET
Basophils Absolute: 0 10*3/uL (ref 0.0–0.1)
Basophils Relative: 0.5 % (ref 0.0–3.0)
Eosinophils Absolute: 0.1 10*3/uL (ref 0.0–0.7)
Eosinophils Relative: 2.4 % (ref 0.0–5.0)
HCT: 48.4 % (ref 39.0–52.0)
Hemoglobin: 16.2 g/dL (ref 13.0–17.0)
Lymphocytes Relative: 36.6 % (ref 12.0–46.0)
Lymphs Abs: 1.8 10*3/uL (ref 0.7–4.0)
MCHC: 33.4 g/dL (ref 30.0–36.0)
MCV: 90.3 fl (ref 78.0–100.0)
Monocytes Absolute: 0.5 10*3/uL (ref 0.1–1.0)
Monocytes Relative: 9.2 % (ref 3.0–12.0)
Neutro Abs: 2.6 10*3/uL (ref 1.4–7.7)
Neutrophils Relative %: 51.3 % (ref 43.0–77.0)
Platelets: 234 10*3/uL (ref 150.0–400.0)
RBC: 5.36 Mil/uL (ref 4.22–5.81)
RDW: 13.6 % (ref 11.5–15.5)
WBC: 5 10*3/uL (ref 4.0–10.5)

## 2021-08-29 LAB — BASIC METABOLIC PANEL
BUN: 17 mg/dL (ref 6–23)
CO2: 26 mEq/L (ref 19–32)
Calcium: 9.8 mg/dL (ref 8.4–10.5)
Chloride: 106 mEq/L (ref 96–112)
Creatinine, Ser: 1.21 mg/dL (ref 0.40–1.50)
GFR: 56.81 mL/min — ABNORMAL LOW (ref >60.00)
Glucose, Bld: 85 mg/dL (ref 70–99)
Potassium: 4.2 mEq/L (ref 3.5–5.1)
Sodium: 141 mEq/L (ref 135–145)

## 2021-08-29 LAB — VITAMIN B12: Vitamin B-12: 561 pg/mL (ref 211–911)

## 2021-08-29 LAB — HEMOGLOBIN A1C: Hgb A1c MFr Bld: 5.8 % (ref 4.6–6.5)

## 2021-08-29 LAB — TSH: TSH: 1.79 u[IU]/mL (ref 0.35–5.50)

## 2021-08-29 NOTE — Progress Notes (Signed)
   Subjective:    Patient ID: Brian Bond, male    DOB: [birth year], 81 y.o.   MRN: [id number]  HPI Here with his son for generalized fatigue that he noticed several months ago. No other specific symptoms. No SOB. His appetite is good. His weight is stable. No recent medication changes.   Review of Systems  Constitutional:  Positive for fatigue.  Respiratory: Negative.    Cardiovascular: Negative.   Gastrointestinal: Negative.   Genitourinary: Negative.        Objective:   Physical Exam Constitutional:      Appearance: Normal appearance.  Cardiovascular:     Rate and Rhythm: Normal rate and regular rhythm.     Pulses: Normal pulses.     Heart sounds: Normal heart sounds.  Pulmonary:     Effort: Pulmonary effort is normal.     Breath sounds: Normal breath sounds.  Neurological:     Mental Status: He is alert.           Assessment & Plan:  Fatigue. We will check labs to look for possible etiologies.  Gershon Crane, MD

## 2021-09-08 ENCOUNTER — Other Ambulatory Visit: Payer: Self-pay | Admitting: Family Medicine

## 2021-09-08 NOTE — Telephone Encounter (Signed)
Pt LOV was on 08/29/2021 Last refill done on 02/28/2021 Please advise

## 2021-09-15 ENCOUNTER — Ambulatory Visit (HOSPITAL_COMMUNITY)
Admission: RE | Admit: 2021-09-15 | Payer: Federal, State, Local not specified - PPO | Source: Ambulatory Visit | Attending: Cardiology | Admitting: Cardiology

## 2021-11-19 ENCOUNTER — Encounter (HOSPITAL_COMMUNITY): Payer: Federal, State, Local not specified - PPO

## 2021-11-24 ENCOUNTER — Telehealth: Payer: Self-pay | Admitting: *Deleted

## 2021-11-24 ENCOUNTER — Ambulatory Visit: Payer: Federal, State, Local not specified - PPO | Admitting: Cardiology

## 2021-11-24 NOTE — Telephone Encounter (Signed)
Called left a message for patient to call back   Would like to reschedule appointment for today - patient did not have repeat LV doppler completed.

## 2021-11-25 ENCOUNTER — Telehealth (INDEPENDENT_AMBULATORY_CARE_PROVIDER_SITE_OTHER): Payer: Federal, State, Local not specified - PPO | Admitting: Family Medicine

## 2021-11-25 ENCOUNTER — Encounter: Payer: Self-pay | Admitting: Family Medicine

## 2021-11-25 DIAGNOSIS — R0989 Other specified symptoms and signs involving the circulatory and respiratory systems: Secondary | ICD-10-CM | POA: Diagnosis not present

## 2021-11-25 DIAGNOSIS — J029 Acute pharyngitis, unspecified: Secondary | ICD-10-CM | POA: Diagnosis not present

## 2021-11-25 DIAGNOSIS — U071 COVID-19: Secondary | ICD-10-CM | POA: Diagnosis not present

## 2021-11-25 LAB — POCT RAPID STREP A (OFFICE): Rapid Strep A Screen: NEGATIVE

## 2021-11-25 LAB — POC COVID19 BINAXNOW: SARS Coronavirus 2 Ag: POSITIVE — AB

## 2021-11-25 MED ORDER — METHYLPREDNISOLONE 4 MG PO TBPK: ORAL_TABLET | ORAL | 0 refills | Status: DC

## 2021-11-25 MED ORDER — NIRMATRELVIR/RITONAVIR (PAXLOVID) TABLET (RENAL DOSING): 2.0000 | ORAL_TABLET | Freq: Two times a day (BID) | ORAL | 0 refills | Status: AC

## 2021-11-25 NOTE — Progress Notes (Signed)
Subjective:    Patient ID: Brian Bond, male    DOB: 03/20/1940, 81 y.o.   MRN: [id number]  HPI Virtual Visit via Video Note  I connected with the patient on 11/25/21 at  3:15 PM EDT by a video enabled telemedicine application and verified that I am speaking with the correct person using two identifiers.  Location patient: home Location provider:work or home office Persons participating in the virtual visit: patient, provider  I discussed the limitations of evaluation and management by telemedicine and the availability of in person appointments. The patient expressed understanding and agreed to proceed.   HPI: Here with his son for 3 days of a bad ST and a dry cough. He has vomited once. He is hoarse and swallowing is difficult due throat pain. No SOB. He came by our office earlier this afternoon, and he tested positive for Covid-19.    ROS: See pertinent positives and negatives per HPI.  Past Medical History:  Diagnosis Date   Bell's palsy 2014   BPH (benign prostatic hyperplasia)    Deep vein thrombosis (DVT) of femoral vein of right lower extremity ([place]) 06/02/2021   Diagnosed Dopplers 3 days and 23, likely onset of symptoms was in late November 2022   Diverticula, colon    Edema    Insomnia    Low back pain    Osteoarthritis    Skin rash 11/2019   hands waist neck scalp rash and skin peeling    Past Surgical History:  Procedure Laterality Date   broken leg     right leg   COLONOSCOPY  05/09/2013   per Dr. Fuller Plan, clear, no repeats needed    FRACTURE SURGERY Right 1974   NM MYOVIEW LTD  04/29/2021   TM Myoview: Poor exercise capacity, achieved 4.6 METS; Max heart rate 134 bpm (95% max age predicted HR) Normal BP response to exercise  No ST deviation was noted;; mostly fixed perfusion defect (medium size, mild intensity) in the apical to basal inferior wall assoc w/ WMA c/w prior infarction.  Moderately reduced EF  ~40-45%.  Mildly enlarged LVEDV& LVESV.  No  ISCHEMIA findings c/w PRIOR MI   PROSTATE SURGERY  01/08/2020   per Dr. Rexene Alberts, Holmium laser enucleation with morcellation   TRANSTHORACIC ECHOCARDIOGRAM  03/2021   LVEF 45 to 50%.  Mildly reduced function with global HK.  GRII DD (but normal LA size-suggests that that atrial pressures are not elevated.) .  Mild septal hypertrophy.  Normal RV size and function.  Normal RAP.  Normal aortic and mitral valve.    Family History  Problem Relation Age of Onset   Breast cancer Sister    Hypertension Other        family hx     Current Outpatient Medications:    apixaban (ELIQUIS) 5 MG TABS tablet, Take 1 tablet (5 mg total) by mouth 2 (two) times daily., Disp: 180 tablet, Rfl: 3   methylPREDNISolone (MEDROL DOSEPAK) 4 MG TBPK tablet, As directed, Disp: 21 tablet, Rfl: 0   nirmatrelvir/ritonavir EUA, renal dosing, (PAXLOVID) 10 x 150 MG & 10 x 100MG  TABS, Take 2 tablets by mouth 2 (two) times daily for 5 days. (Take nirmatrelvir 150 mg one tablet twice daily for 5 days and ritonavir 100 mg one tablet twice daily for 5 days) Patient GFR is 56, Disp: 20 tablet, Rfl: 0   tadalafil (CIALIS) 20 MG tablet, Take 20 mg by mouth daily., Disp: , Rfl:    temazepam (RESTORIL)  30 MG capsule, TAKE 1 CAPSULE BY MOUTH EVERY NIGHT AT BEDTIME AS NEEDED, Disp: 30 capsule, Rfl: 5   furosemide (LASIX) 40 MG tablet, Take 1 tablet (40 mg total) by mouth daily. (Patient not taking: Reported on 11/25/2021), Disp: 30 tablet, Rfl: 5   potassium chloride (KLOR-CON 10) 10 MEQ tablet, Take 1 tablet (10 mEq total) by mouth daily. (Patient not taking: Reported on 11/25/2021), Disp: 30 tablet, Rfl: 5  EXAM:  VITALS per patient if applicable:  GENERAL: alert, oriented, he appears somewhat weak and he is quite hoarse   HEENT: atraumatic, conjunttiva clear, no obvious abnormalities on inspection of external nose and ears  NECK: normal movements of the head and neck  LUNGS: on inspection no signs of respiratory distress,  breathing rate appears normal, no obvious gross SOB, gasping or wheezing  CV: no obvious cyanosis  MS: moves all visible extremities without noticeable abnormality  PSYCH/NEURO: pleasant and cooperative, no obvious depression or anxiety, speech and thought processing grossly intact  ASSESSMENT AND PLAN: Covid infection. We will treat with 5 days of Paxlovid and a Medrol dose pack. He will drink plenty of fluids. He understands that he should go to the ED immediately if he has any trouble breathing.  Gershon Crane, MD  Discussed the following assessment and plan:  Sore throat - Plan: POC COVID-19 BinaxNow, POCT rapid strep A  Chest congestion - Plan: POC COVID-19 BinaxNow     I discussed the assessment and treatment plan with the patient. The patient was provided an opportunity to ask questions and all were answered. The patient agreed with the plan and demonstrated an understanding of the instructions.   The patient was advised to call back or seek an in-person evaluation if the symptoms worsen or if the condition fails to improve as anticipated.      Review of Systems     Objective:   Physical Exam        Assessment & Plan:

## 2021-12-01 ENCOUNTER — Telehealth: Payer: Self-pay | Admitting: Family Medicine

## 2021-12-01 NOTE — Telephone Encounter (Signed)
Pt has one more day of anti-viral medication and he is feeling better and would like to stop taking medication and would like to go be to work and he needs a work note

## 2021-12-02 ENCOUNTER — Telehealth: Payer: Self-pay | Admitting: Family Medicine

## 2021-12-02 NOTE — Telephone Encounter (Signed)
Called patient he has one day left to take medication, patient requested to return to work on 12/04/21.    Work note complete, patient to come into office for print/pick up.

## 2021-12-02 NOTE — Telephone Encounter (Signed)
See previous message from 12/01/21.  Message complete.

## 2021-12-02 NOTE — Telephone Encounter (Signed)
That is okay with me. Please get him a work note

## 2021-12-02 NOTE — Telephone Encounter (Signed)
Please disregard

## 2021-12-02 NOTE — Telephone Encounter (Deleted)
Pt was seen (VV) on 11/25/21, tested positive for Covid, and now would like to know when can he return to work?  Please call: [phone]

## 2021-12-04 ENCOUNTER — Ambulatory Visit (HOSPITAL_COMMUNITY)
Admission: RE | Admit: 2021-12-04 | Discharge: 2021-12-04 | Disposition: A | Discharge status: Home / Self Care | Payer: Federal, State, Local not specified - PPO | Source: Ambulatory Visit | Attending: Cardiovascular Disease | Admitting: Cardiovascular Disease

## 2021-12-04 DIAGNOSIS — I82513 Chronic embolism and thrombosis of femoral vein, bilateral: Secondary | ICD-10-CM | POA: Diagnosis not present

## 2021-12-04 DIAGNOSIS — I83893 Varicose veins of bilateral lower extremities with other complications: Secondary | ICD-10-CM | POA: Insufficient documentation

## 2021-12-08 NOTE — Telephone Encounter (Signed)
Attempted to contact pt regarding work excuse not but pt voicemail is full, no way to leave pt a message, a message sent via [place]

## 2021-12-10 ENCOUNTER — Telehealth: Payer: Self-pay | Admitting: Cardiology

## 2021-12-10 NOTE — Telephone Encounter (Signed)
Pt informed of providers result & recommendations. Pt verbalized understanding. No further questions . Pt should have enough refills until March 2024.

## 2021-12-10 NOTE — Telephone Encounter (Signed)
Follow Up:     Patient is calling to find out his results for his doppler that he had on 12-04-21 please.

## 2021-12-16 ENCOUNTER — Encounter: Payer: Self-pay | Admitting: Cardiology

## 2021-12-16 ENCOUNTER — Ambulatory Visit: Payer: Federal, State, Local not specified - PPO | Attending: Cardiology | Admitting: Cardiology

## 2021-12-16 VITALS — BP 116/68 | HR 53 | Ht 77.0 in | Wt 215.2 lb

## 2021-12-16 DIAGNOSIS — I5042 Chronic combined systolic (congestive) and diastolic (congestive) heart failure: Secondary | ICD-10-CM

## 2021-12-16 DIAGNOSIS — I83893 Varicose veins of bilateral lower extremities with other complications: Secondary | ICD-10-CM

## 2021-12-16 DIAGNOSIS — I82513 Chronic embolism and thrombosis of femoral vein, bilateral: Secondary | ICD-10-CM | POA: Diagnosis not present

## 2021-12-16 NOTE — Progress Notes (Unsigned)
Primary Care Provider: Laurey Morale, MD [place] Cardiologist: Glenetta Hew, MD Electrophysiologist: None  Clinic Note: No chief complaint on file.   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 male with a PMH below who presents today for ***. DARELLE Bond is a 81 y.o. male who is being seen today for the evaluation of *** at the request of Laurey Morale, MD.  Brian Bond was last seen on ***  Recent Hospitalizations: ***  Reviewed  CV studies:    The following studies were reviewed today: (if available, images/films reviewed: From Epic Chart or Care Everywhere) ***:   Interval History:   Brian Bond   CV Review of Symptoms (Summary) Cardiovascular ROS: {roscv:310661}  REVIEWED OF SYSTEMS   ROS  I have reviewed and (if needed) personally updated the patient's problem list, medications, allergies, past medical and surgical history, social and family history.   PAST MEDICAL HISTORY   Past Medical History:  Diagnosis Date   Bell's palsy 2014   BPH (benign prostatic hyperplasia)    Deep vein thrombosis (DVT) of femoral vein of right lower extremity ([place]) 06/02/2021   Diagnosed Dopplers 3 days and 23, likely onset of symptoms was in late November 2022   Diverticula, colon    Edema    Insomnia    Low back pain    Osteoarthritis    Skin rash 11/2019   hands waist neck scalp rash and skin peeling    PAST SURGICAL HISTORY   Past Surgical History:  Procedure Laterality Date   broken leg     right leg   COLONOSCOPY  05/09/2013   per  Dr. Fuller Plan, clear, no repeats needed    FRACTURE SURGERY Right 1974   NM MYOVIEW LTD  04/29/2021   TM Myoview: Poor exercise capacity, achieved 4.6 METS; Max heart rate 134 bpm (95% max age predicted HR) Normal BP response to exercise  No ST deviation was noted;; mostly fixed perfusion defect (medium size, mild intensity) in the apical to basal inferior wall assoc w/ WMA c/w prior infarction.  Moderately reduced EF  ~40-45%.  Mildly enlarged LVEDV& LVESV.  No ISCHEMIA findings c/w PRIOR MI   PROSTATE SURGERY  01/08/2020   per Dr. Rexene Alberts, Holmium laser enucleation with morcellation   TRANSTHORACIC ECHOCARDIOGRAM  03/2021   LVEF 45 to 50%.  Mildly reduced function with global HK.  GRII DD (but normal LA size-suggests that that atrial pressures are not elevated.) .  Mild septal hypertrophy.  Normal RV size and function.  Normal RAP.  Normal aortic and mitral valve.    Immunization History  Administered Date(s) Administered   Fluad Quad(high Dose 65+) 11/26/2020   PFIZER(Purple Top)SARS-COV-2 Vaccination 06/29/2019, 07/24/2019   Pneumococcal Conjugate-13 11/26/2020    MEDICATIONS/ALLERGIES   Current Meds  Medication Sig   apixaban (ELIQUIS) 5 MG TABS tablet Take 1 tablet (5 mg total) by mouth 2 (two) times daily.   furosemide (LASIX) 40 MG tablet Take 1 tablet (40 mg total) by mouth daily.   potassium chloride (KLOR-CON 10) 10 MEQ tablet Take 1  tablet (10 mEq total) by mouth daily.   tadalafil (CIALIS) 20 MG tablet Take 20 mg by mouth daily.   temazepam (RESTORIL) 30 MG capsule TAKE 1 CAPSULE BY MOUTH EVERY NIGHT AT BEDTIME AS NEEDED    No Known Allergies  SOCIAL HISTORY/FAMILY HISTORY   Reviewed in Epic:  Pertinent findings:  Social History   Tobacco Use   Smoking status: Former    Packs/day: 0.50    Years: 5.00    Total pack years: 2.50    Types: Cigarettes    Quit date: 05/03/1991    Years since quitting: 30.6   Smokeless tobacco: Never  Vaping Use   Vaping Use: Never  used  Substance Use Topics   Alcohol use: Yes    Alcohol/week: 4.0 standard drinks of alcohol    Types: 4 Cans of beer per week    Comment: occ   Drug use: No   Social History   Social History Narrative   Not on file    OBJCTIVE -PE, EKG, labs   Wt Readings from Last 3 Encounters:  12/16/21 215 lb 3.2 oz (97.6 kg)  08/29/21 216 lb (98 kg)  06/03/21 212 lb (96.2 kg)    Physical Exam: BP 116/68   Pulse (!) 53   Ht 6\' 5"  (1.956 m)   Wt 215 lb 3.2 oz (97.6 kg)   SpO2 99%   BMI 25.52 kg/m  Physical Exam   Adult ECG Report  Rate: *** ;  Rhythm: {rhythm:17366};   Narrative Interpretation: ***  Recent Labs:  ***  Lab Results  Component Value Date   CHOL 164 11/26/2020   HDL 62.00 11/26/2020   LDLCALC 96 11/26/2020   TRIG 31.0 11/26/2020   CHOLHDL 3 11/26/2020   Lab Results  Component Value Date   CREATININE 1.21 08/29/2021   BUN 17 08/29/2021   NA 141 08/29/2021   K 4.2 08/29/2021   CL 106 08/29/2021   CO2 26 08/29/2021      Latest Ref Rng & Units 08/29/2021    2:29 PM 11/26/2020   10:20 AM 05/16/2020    3:47 PM  CBC  WBC 4.0 - 10.5 K/uL 5.0  4.3  6.0   Hemoglobin 13.0 - 17.0 g/dL 07/16/2020  35.3  29.9   Hematocrit 39.0 - 52.0 % 48.4  45.0  45.5   Platelets 150.0 - 400.0 K/uL 234.0  C 203.0  217     C Corrected result    Lab Results  Component Value Date   HGBA1C 5.8 08/29/2021   Lab Results  Component Value Date   TSH 1.79 08/29/2021    ================================================== I spent a total of ***minutes with the patient spent in direct patient consultation.  Additional time spent with chart review  / charting (studies, outside notes, etc): *** min Total Time: *** min  Current medicines are reviewed at length with the patient today.  (+/- concerns) ***  Notice: This dictation was prepared with Dragon dictation along with smart phrase technology. Any transcriptional errors that result from this process are unintentional and may not be  corrected upon review.  Studies Ordered:   No orders of the defined types were placed in this encounter.  No orders of the defined types were placed in this encounter.   Patient Instructions / Medication Changes & Studies & Tests Ordered   There are no Patient Instructions on file for this visit.     08/31/2021, MD, MS Marykay Lex, M.D., M.S. Interventional Cardiologist  [facility]  Pager # [phone] Phone # [phone] [address] [place], [place] [address]   Thank you for choosing [facility]!!

## 2021-12-16 NOTE — Patient Instructions (Signed)
Medication Instructions:  No changes  *If you need a refill on your cardiac medications before your next appointment, please call your pharmacy*   Lab Work: Not needed    Testing/Procedures: Not needed   Follow-Up: At [facility], you and your health needs are our priority.  As part of our continuing mission to provide you with exceptional heart care, we have created designated Provider Care Teams.  These Care Teams include your primary Cardiologist (physician) and Advanced Practice Providers (APPs -  Physician Assistants and Nurse Practitioners) who all work together to provide you with the care you need, when you need it.     Your next appointment:   6 month(s)  The format for your next appointment:   In Person  Provider:   Glenetta Hew, MD    Other Instructions  Elevated feet at least 20 min four times a day Wear support stocking daily  Continue exercising daily with walking only not strenuous activity

## 2021-12-17 ENCOUNTER — Encounter: Payer: Self-pay | Admitting: Cardiology

## 2021-12-17 NOTE — Assessment & Plan Note (Signed)
His DVT is chronic so I think he is probably fine to go back to wearing his support stockings.  I recommend he wear his thigh-high support stockings because the thrombus does go up above the knee.  Also talked about doing light exercising including walking pedaling exercises, dorsiflexion/plantarflexion exercises but I do not want to do any aggressive walking or activity for fear of potentially breaking free the thrombus in his femoral vein.  Breaking the thrombus free.  Plan:   Recommend thigh-high support stockings (he says he has no), foot elevation 3-4 times a day 15 minutes at a time.    Recommend light exercise and plantarflexion/dorsiflexion foot exercises.  This is in addition to his diuretic dose.

## 2021-12-17 NOTE — Assessment & Plan Note (Signed)
Mildly reduced EF - HfmrEF with no PND, or orthopnea.  I suspect that he is pre much euvolemic.  He still has edema but this seems to be probably not as much related to CHF as it is related to venous insufficiency.  I think probably has chronic venous insufficiency that exacerbated his DVT formation.  NYHA class I symptoms with some exertional dyspnea.  BP well controlled, on no medications.  Low threshold to consider adding afterload reduction, but for now we will simply continue with same dose of diuretic, unless he starts noticing increased level of symptomatology.  Not able to use beta-blocker because of bradycardia, we can consider spironolactone plus or minus ARB if pressures are little higher.

## 2021-12-17 NOTE — Assessment & Plan Note (Addendum)
He now has chronic DVT with some recanalization upstream and a little bit of improvement in the TP trunk.  At this point I think he is going to be on longstanding DOAC.  We can reassess another Doppler 1 year out from initial diagnosis which would put him into a February-March timeframe of 2024.  I will see him back after that.  For now continue Eliquis 5 mg twice daily in addition to thigh-high support stockings. Avoid vigorous exercise but encouraged walking

## 2021-12-17 NOTE — Progress Notes (Incomplete)
Primary Care Provider: Nelwyn Salisbury, MD [facility] Cardiologist: Bryan Lemma, MD Electrophysiologist: None  Clinic Note: No chief complaint on file.  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 male with a PMH below who presents today for ***. He returns today at the request of Dr. Gershon Crane.  R LE DVT (Inciting event  - [address] [place]) => gained 22 pounds with significant BLE swelling R>>L Began wearing compression stockings & PCP initiated diuretic - but RLE swelling persisted > L LE -> with diuresis, wg  Brian Bond was last seen on June 03, 2021 (as first follow-up visit)  Recent Hospitalizations: ***  Reviewed  CV studies:    The following studies were reviewed today: (if available, images/films reviewed: From Epic Chart or Care Everywhere) ***:   Interval History:   Brian Bond   CV Review of Symptoms (Summary) Cardiovascular ROS: {roscv:310661}  REVIEWED OF SYSTEMS   ROS  I have reviewed and (if needed) personally updated the patient's problem list, medications, allergies, past medical and surgical history, social and family history.   PAST MEDICAL HISTORY   Past Medical History:  Diagnosis Date  . Bell's palsy 2014  . BPH (benign prostatic hyperplasia)   . Deep vein thrombosis (DVT) of femoral vein of right lower extremity (HCC) 06/02/2021   Diagnosed Dopplers 3 days and 23, likely onset of symptoms was in late November 2022  . Diverticula, colon   . Edema   . Insomnia   . Low back pain   . Osteoarthritis   . Skin rash  11/2019   hands waist neck scalp rash and skin peeling    PAST SURGICAL HISTORY   Past Surgical History:  Procedure Laterality Date  . broken leg     right leg  . COLONOSCOPY  05/09/2013   per Dr. Russella Dar, clear, no repeats needed   . FRACTURE SURGERY Right 1974  . NM MYOVIEW LTD  04/29/2021   TM Myoview: Poor exercise capacity, achieved 4.6 METS; Max heart rate 134 bpm (95% max age predicted HR). Normal BP response to exercise.  No ST deviation was noted;; mostly fixed perfusion defect (medium size, mild intensity) in the apical to basal inferior wall assoc w/ WMA c/w prior infarction.  Moderately reduced EF  ~40-45%.  Mildly enlarged LVEDV& LVESV.  No ISCHEMIA findings c/w PRIOR MI  . PROSTATE SURGERY  01/08/2020   per Dr. Jettie Pagan, Holmium laser enucleation with morcellation  . TRANSTHORACIC ECHOCARDIOGRAM  03/2021   LVEF 45 to 50%.  Mildly reduced function with global HK.  GRII DD (but normal LA size-suggests that that atrial pressures are not elevated.) .  Mild septal hypertrophy.  Normal RV size and function.  Normal RAP.  Normal aortic and mitral valve.    Immunization History  Administered Date(s) Administered  . Fluad Quad(high Dose 65+) 11/26/2020  . PFIZER(Purple Top)SARS-COV-2 Vaccination 06/29/2019, 07/24/2019  . Pneumococcal Conjugate-13 11/26/2020    MEDICATIONS/ALLERGIES   Current Meds  Medication Sig  . apixaban (ELIQUIS) 5 MG TABS tablet Take 1 tablet (5 mg total) by mouth 2 (  two) times daily.  . furosemide (LASIX) 40 MG tablet Take 1 tablet (40 mg total) by mouth daily.  . potassium chloride (KLOR-CON 10) 10 MEQ tablet Take 1 tablet (10 mEq total) by mouth daily.  . tadalafil (CIALIS) 20 MG tablet Take 20 mg by mouth daily.  . temazepam (RESTORIL) 30 MG capsule TAKE 1 CAPSULE BY MOUTH EVERY NIGHT AT BEDTIME AS NEEDED    No Known Allergies  SOCIAL HISTORY/FAMILY HISTORY   Reviewed in Epic:  Pertinent findings:  Social History   Tobacco Use  . Smoking  status: Former    Packs/day: 0.50    Years: 5.00    Total pack years: 2.50    Types: Cigarettes    Quit date: 05/03/1991    Years since quitting: 30.6  . Smokeless tobacco: Never  Vaping Use  . Vaping Use: Never used  Substance Use Topics  . Alcohol use: Yes    Alcohol/week: 4.0 standard drinks of alcohol    Types: 4 Cans of beer per week    Comment: occ  . Drug use: No   Social History   Social History Narrative  . Not on file    OBJCTIVE -PE, EKG, labs   Wt Readings from Last 3 Encounters:  12/16/21 215 lb 3.2 oz (97.6 kg)  08/29/21 216 lb (98 kg)  06/03/21 212 lb (96.2 kg)    Physical Exam: BP 116/68   Pulse (!) 53   Ht 6\' 5"  (1.956 m)   Wt 215 lb 3.2 oz (97.6 kg)   SpO2 99%   BMI 25.52 kg/m  Physical Exam   Adult ECG Report  Rate: *** ;  Rhythm: {rhythm:17366};   Narrative Interpretation: ***  Recent Labs:  ***  Lab Results  Component Value Date   CHOL 164 11/26/2020   HDL 62.00 11/26/2020   LDLCALC 96 11/26/2020   TRIG 31.0 11/26/2020   CHOLHDL 3 11/26/2020   Lab Results  Component Value Date   CREATININE 1.21 08/29/2021   BUN 17 08/29/2021   NA 141 08/29/2021   K 4.2 08/29/2021   CL 106 08/29/2021   CO2 26 08/29/2021      Latest Ref Rng & Units 08/29/2021    2:29 PM 11/26/2020   10:20 AM 05/16/2020    3:47 PM  CBC  WBC 4.0 - 10.5 K/uL 5.0  4.3  6.0   Hemoglobin 13.0 - 17.0 g/dL 16.2  14.6  14.6   Hematocrit 39.0 - 52.0 % 48.4  45.0  45.5   Platelets 150.0 - 400.0 K/uL 234.0  C 203.0  217     C Corrected result    Lab Results  Component Value Date   HGBA1C 5.8 08/29/2021   Lab Results  Component Value Date   TSH 1.79 08/29/2021    ================================================== I spent a total of ***minutes with the patient spent in direct patient consultation.  Additional time spent with chart review  / charting (studies, outside notes, etc): *** min Total Time: *** min  Current medicines are reviewed at length with the  patient today.  (+/- concerns) ***  Notice: This dictation was prepared with Dragon dictation along with smart phrase technology. Any transcriptional errors that result from this process are unintentional and may not be corrected upon review.  Studies Ordered:   No orders of the defined types were placed in this encounter.  No orders of the defined types were placed in this encounter.   Patient Instructions / Medication Changes & Studies &  Tests Ordered   There are no Patient Instructions on file for this visit.     Marykay Lex, MD, MS Bryan Lemma, M.D., M.S. Interventional Cardiologist  [facility]  Pager # [phone] Phone # [phone] [address] [place], [place] [address]   Thank you for choosing [facility]!!

## 2022-01-09 ENCOUNTER — Ambulatory Visit: Payer: Federal, State, Local not specified - PPO | Admitting: Physician Assistant

## 2022-01-09 ENCOUNTER — Ambulatory Visit: Payer: Federal, State, Local not specified - PPO | Admitting: Adult Health

## 2022-01-09 ENCOUNTER — Telehealth: Payer: Self-pay | Admitting: Family Medicine

## 2022-01-09 ENCOUNTER — Encounter: Payer: Self-pay | Admitting: Adult Health

## 2022-01-09 VITALS — BP 120/82 | HR 59 | Temp 98.5°F | Wt 215.0 lb

## 2022-01-09 DIAGNOSIS — M545 Low back pain, unspecified: Secondary | ICD-10-CM | POA: Diagnosis not present

## 2022-01-09 DIAGNOSIS — R350 Frequency of micturition: Secondary | ICD-10-CM

## 2022-01-09 MED ORDER — PREDNISONE 10 MG PO TABS: 10.0000 mg | ORAL_TABLET | Freq: Every day | ORAL | 0 refills | Status: DC

## 2022-01-09 NOTE — Progress Notes (Signed)
Subjective:    Patient ID: Brian Bond, male    DOB: [birth year], 81 y.o.   MRN: [id number]  HPI 81 year old male who  has a past medical history of Bell's palsy (2014), BPH (benign prostatic hyperplasia), Deep vein thrombosis (DVT) of femoral vein of right lower extremity ([place]) (06/02/2021), Diverticula, colon, Edema, Insomnia, Low back pain, Osteoarthritis, and Skin rash (11/2019).  He is a patient of Dr. Sarajane Jews who I am seeing today for 2 days of acute back pain. 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  He believes he injured his back roughly 2 days ago while at work.  Pain seems to be worse with bending over, getting out of bed or out of a chair.  Does not have pain with walking.  Home he has been using topical muscle rub which seems to help to some degree.  Nuys any radiating pain down his legs.   Additionally, he has also experienced urinary frequency over the last 2 to 3 months.  He does have a history of BPH but had "had a procedure done to shrink my prostate a few years ago".  Denies dysuria, hematuria, or urgency   Review of Systems See HPI   Past Medical History:  Diagnosis Date   Bell's palsy 2014   BPH (benign prostatic hyperplasia)    Deep vein thrombosis (DVT) of femoral vein of right lower extremity ([place]) 06/02/2021   Diagnosed Dopplers 3 days and 23, likely onset of symptoms was in late November 2022 => persistent DVT as of November 2023; remains on DOAC   Diverticula, colon    Edema    Insomnia    Low back pain    Osteoarthritis    Skin rash 11/2019   hands waist neck scalp rash and skin peeling    Social History   Socioeconomic History   Marital status: Widowed    Spouse name: Not on file   Number of children: Not on file   Years of education: Not on file   Highest education level: Not on file  Occupational History   Not on file  Tobacco Use   Smoking status: Former    Packs/day: 0.50    Years: 5.00    Total pack years: 2.50     Types: Cigarettes    Quit date: 05/03/1991    Years since quitting: 30.7   Smokeless tobacco: Never  Vaping Use   Vaping Use: Never used  Substance and Sexual Activity   Alcohol use: Yes    Alcohol/week: 4.0 standard drinks of alcohol    Types: 4 Cans of beer per week    Comment: occ   Drug use: No   Sexual activity: Not on file  Other Topics Concern   Not on file  Social History Narrative   Not on file   Social Determinants of Health   Financial Resource Strain: Not on file  Food Insecurity: Not on file  Transportation Needs: Not on file  Physical Activity: Not on file  Stress: Not on file  Social Connections: Not on file  Intimate Partner Violence: Not on file    Past Surgical History:  Procedure Laterality Date   broken leg     right leg   COLONOSCOPY  05/09/2013   per Dr. Fuller Plan, clear, no repeats needed    FRACTURE SURGERY Right 1974   NM MYOVIEW LTD  04/29/2021   TM Myoview: Poor exercise capacity, achieved 4.6 METS; Max heart  rate 134 bpm (95% max age predicted HR) Normal BP response to exercise  No ST deviation was noted;; mostly fixed perfusion defect (medium size, mild intensity) in the apical to basal inferior wall assoc w/ WMA c/w prior infarction.  Moderately reduced EF  ~40-45%.  Mildly enlarged LVEDV& LVESV.  No ISCHEMIA findings c/w PRIOR MI   PROSTATE SURGERY  01/08/2020   per Dr. Jettie Pagan, Holmium laser enucleation with morcellation   TRANSTHORACIC ECHOCARDIOGRAM  03/2021   LVEF 45 to 50%.  Mildly reduced function with global HK.  GRII DD (but normal LA size-suggests that that atrial pressures are not elevated.) .  Mild septal hypertrophy.  Normal RV size and function.  Normal RAP.  Normal aortic and mitral valve.    Family History  Problem Relation Age of Onset   Breast cancer Sister    Hypertension Other        family hx    No Known Allergies  Current Outpatient Medications on File Prior to Visit  Medication Sig Dispense Refill   apixaban  (ELIQUIS) 5 MG TABS tablet Take 1 tablet (5 mg total) by mouth 2 (two) times daily. 180 tablet 3   furosemide (LASIX) 40 MG tablet Take 1 tablet (40 mg total) by mouth daily. 30 tablet 5   potassium chloride (KLOR-CON 10) 10 MEQ tablet Take 1 tablet (10 mEq total) by mouth daily. 30 tablet 5   tadalafil (CIALIS) 20 MG tablet Take 20 mg by mouth daily.     temazepam (RESTORIL) 30 MG capsule TAKE 1 CAPSULE BY MOUTH EVERY NIGHT AT BEDTIME AS NEEDED 30 capsule 5   No current facility-administered medications on file prior to visit.    BP 120/82   Pulse (!) 59   Temp 98.5 F (36.9 C)   Wt 215 lb (97.5 kg)   SpO2 97%   BMI 25.50 kg/m       Objective:   Physical Exam Musculoskeletal:        General: Tenderness (lumbar paraspinal muscles. No spinal tenderness) present. Normal range of motion.  Skin:    General: Skin is warm and dry.     Capillary Refill: Capillary refill takes less than 2 seconds.  Neurological:     General: No focal deficit present.     Mental Status: He is oriented to person, place, and time.  Psychiatric:        Mood and Affect: Mood normal.        Behavior: Behavior normal.        Thought Content: Thought content normal.       Assessment & Plan:  1. Acute bilateral low back pain without sciatica -Appears as muscular in origin.  Encouraged to continue with muscle rub, can use heating pad, stretching exercises as well.  Will prescribe a short course of prednisone to help with pain and inflammation.  Follow-up with PCP if not improving over the next week - predniSONE (DELTASONE) 10 MG tablet; Take 1 tablet (10 mg total) by mouth daily with breakfast.  Dispense: 5 tablet; Refill: 0  2. Frequent urination - Will check for UTI. Likely prostate related. Advised to follow up with urologist  - Urinalysis; Future - Urinalysis  Shirline Frees, NP

## 2022-01-09 NOTE — Telephone Encounter (Signed)
Patient states: -He was experiencing back pain that he rated an 8 out of 10  - He has been bending a lot and thinks this could be the cause  - PCP office stated he should call [facility] to get in with a different provider since his PCP was fully booked today.   Patient was scheduled to see Inda Coke @ 2:40pm on 01/09/22 by IC at [facility].  Patient stated he preferred to be seen within his PCP's office. He was scheduled with Dorothyann Peng @ [facility] @ 3:15pm on 01/09/22.

## 2022-01-10 LAB — URINALYSIS, ROUTINE W REFLEX MICROSCOPIC
Bilirubin Urine: NEGATIVE
Glucose, UA: NEGATIVE
Hgb urine dipstick: NEGATIVE
Ketones, ur: NEGATIVE
Leukocytes,Ua: NEGATIVE
Nitrite: NEGATIVE
Protein, ur: NEGATIVE
Specific Gravity, Urine: 1.011 (ref 1.001–1.035)
pH: 6 (ref 5.0–8.0)

## 2022-01-10 LAB — EXTRA URINE SPECIMEN

## 2022-03-25 ENCOUNTER — Telehealth: Payer: Self-pay | Admitting: Cardiology

## 2022-03-25 NOTE — Telephone Encounter (Signed)
Spoke with pt, he reports he has had nausea and stomach problems pretty much since starting the medication. He reports he take the medication with food. He reports he did not think he was going to be taking the eliquis anymore. Aware per dr Ellyn Hack last note, he will have the dopplers 06/17/22 and then see dr harding after. Patient also reports a pain in his neck as if you slept on the pillow wrong. The pain does change with movement. Patient aware he can take tylenol for the pain. Aware will forward to the pharmacist to see if they have any other recommendations regarding eliquis.

## 2022-03-25 NOTE — Telephone Encounter (Signed)
Pt c/o medication issue:  1. Name of Medication:           apixaban (ELIQUIS) 5 MG TABS tablet     2. How are you currently taking this medication (dosage and times per day)?  Patient takes twice daily as prescribed  3. Are you having a reaction (difficulty breathing--STAT)?   4. What is your medication issue?   Patient states he has a blood clot in his leg, he states it has been there for about 1 year. He also mentions nausea and an uneasy feeling in his stomach when taking it.

## 2022-03-26 NOTE — Telephone Encounter (Signed)
Unfortunately patient will need to stay on the Eliquis until at least April. Indigestion and stomach issues are possible with Eliquis however. Recommend verifying no signs or symptoms of bleeding in stool or vomit. Recommend avoiding fatty foods and increasing fiber which may help.

## 2022-03-27 ENCOUNTER — Other Ambulatory Visit: Payer: Self-pay | Admitting: Family Medicine

## 2022-03-27 NOTE — Telephone Encounter (Signed)
Pt LOV was 01/09/2022 Last refill was done on 09/08/2021 Please advise

## 2022-03-27 NOTE — Telephone Encounter (Signed)
Unable to reach pt or leave a message  

## 2022-03-30 NOTE — Telephone Encounter (Signed)
Pt called to FU on this refill:  temazepam (RESTORIL) 30 MG capsule   [id number] [address], [place] AT [facility] Phone: [phone]  Fax: [phone]

## 2022-04-01 ENCOUNTER — Ambulatory Visit: Payer: Federal, State, Local not specified - PPO | Admitting: Family Medicine

## 2022-04-01 ENCOUNTER — Encounter: Payer: Self-pay | Admitting: Family Medicine

## 2022-04-01 VITALS — BP 120/82 | HR 65 | Temp 99.4°F | Ht 77.0 in | Wt 211.8 lb

## 2022-04-01 DIAGNOSIS — K219 Gastro-esophageal reflux disease without esophagitis: Secondary | ICD-10-CM

## 2022-04-01 DIAGNOSIS — R066 Hiccough: Secondary | ICD-10-CM | POA: Diagnosis not present

## 2022-04-01 DIAGNOSIS — R112 Nausea with vomiting, unspecified: Secondary | ICD-10-CM

## 2022-04-01 LAB — COMPREHENSIVE METABOLIC PANEL
ALT: 29 U/L (ref 0–53)
AST: 31 U/L (ref 0–37)
Albumin: 4 g/dL (ref 3.5–5.2)
Alkaline Phosphatase: 60 U/L (ref 39–117)
BUN: 15 mg/dL (ref 6–23)
CO2: 27 mEq/L (ref 19–32)
Calcium: 9.5 mg/dL (ref 8.4–10.5)
Chloride: 106 mEq/L (ref 96–112)
Creatinine, Ser: 1.07 mg/dL (ref 0.40–1.50)
GFR: 65.57 mL/min (ref >60.00)
Glucose, Bld: 124 mg/dL — ABNORMAL HIGH (ref 70–99)
Potassium: 4 mEq/L (ref 3.5–5.1)
Sodium: 140 mEq/L (ref 135–145)
Total Bilirubin: 0.7 mg/dL (ref 0.2–1.2)
Total Protein: 7.2 g/dL (ref 6.0–8.3)

## 2022-04-01 LAB — CBC
HCT: 43.6 % (ref 39.0–52.0)
Hemoglobin: 14.6 g/dL (ref 13.0–17.0)
MCHC: 33.6 g/dL (ref 30.0–36.0)
MCV: 88 fl (ref 78.0–100.0)
Platelets: 237 10*3/uL (ref 150.0–400.0)
RBC: 4.95 Mil/uL (ref 4.22–5.81)
RDW: 14.2 % (ref 11.5–15.5)
WBC: 7.7 10*3/uL (ref 4.0–10.5)

## 2022-04-01 LAB — AMYLASE: Amylase: 83 U/L (ref 27–131)

## 2022-04-01 LAB — LIPASE: Lipase: 10 U/L — ABNORMAL LOW (ref 11.0–59.0)

## 2022-04-01 MED ORDER — ONDANSETRON 4 MG PO TBDP: 4.0000 mg | ORAL_TABLET | Freq: Three times a day (TID) | ORAL | 0 refills | Status: DC | PRN

## 2022-04-01 MED ORDER — PANTOPRAZOLE SODIUM 40 MG PO TBEC: 40.0000 mg | DELAYED_RELEASE_TABLET | Freq: Every day | ORAL | 2 refills | Status: DC

## 2022-04-01 NOTE — Progress Notes (Signed)
His labs are ok-- lipase and amylase are negative for signs of pancreatitis.

## 2022-04-01 NOTE — Progress Notes (Signed)
Established Patient Office Visit  Subjective   Patient ID: Brian Bond, male    DOB: [birth year]  Age: 82 y.o. MRN: [id number]  Chief Complaint  Patient presents with   Emesis    X4 days   Hiccups    X4 days    Pt is reporting 4 day history of emesis and hiccups, chills, states that he is having trouble keeping anything down, states that he is having some epigastric pain, some burning, no diarrhea. Still passing gas. Is having normal BM's, had one last night, maybe it was a little darker than usual. States that this has happened to him in the past, states it started after he drank some wine last week. States he does not usually consume alcohol. Patient thought that maybe it was the eliquis that might have caused it, however he has been on this medication for several months now.   Emesis  This is a new problem. The current episode started in the past 7 days. There has been no fever. Associated symptoms include abdominal pain and chills. He has tried nothing for the symptoms.   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) 06/03/2021   Abnormal nuclear stress test 04/29/2021   Chest pain with moderate risk for cardiac etiology 04/23/2021   Varicose veins of leg with edema, bilateral 04/23/2021   Chronic combined systolic and diastolic congestive heart failure ([place]) 04/02/2021   Stage 3a  chronic kidney disease ([place]) 04/02/2021   BPH (benign prostatic hyperplasia) 01/08/2020   Atopic dermatitis 01/02/2020   BPH with urinary obstruction 01/04/2007   OSTEOARTHRITIS 01/04/2007   Lumbago 12/06/2006   DIVERTICULOSIS, COLON 08/30/2006   DEGENERATIVE DISC DISEASE, LUMBAR SPINE 08/30/2006      Review of Systems  Constitutional:  Positive for chills.  Gastrointestinal:  Positive for abdominal pain and vomiting.      Objective:     BP 120/82 (BP Location: Left Arm, Patient Position: Sitting, Cuff Size: Large)   Pulse 65   Temp 99.4 F (37.4 C) (Oral)   Ht 6\' 5"  (1.956 m)   Wt 211 lb 12.8 oz (96.1 kg)   SpO2 98%   BMI 25.12 kg/m    Physical Exam Vitals reviewed.  Cardiovascular:     Rate and Rhythm: Normal rate and regular rhythm.  Pulmonary:     Effort: Pulmonary effort is normal.     Breath sounds: Normal breath sounds.  Abdominal:     General: Bowel sounds are normal.     Palpations: Abdomen is soft. There is no mass.     Tenderness: There is abdominal tenderness (mild epigastric tenderness to palpation). There is no guarding or rebound.      No results found for any visits on 04/01/22.    The ASCVD Risk score (Arnett DK, et al., 2019) failed to calculate for the following  reasons:   The 2019 ASCVD risk score is only valid for ages 63 to 30    Assessment & Plan:   Problem List Items Addressed This Visit   None Visit Diagnoses     Nausea and vomiting, unspecified vomiting type    -  Primary   Relevant Medications   ondansetron (ZOFRAN-ODT) 4 MG disintegrating tablet   Other Relevant Orders   Lipase   Amylase   CMP   Hiccups       Relevant Medications   pantoprazole (PROTONIX) 40 MG tablet   Gastroesophageal reflux disease without esophagitis       Relevant Medications   ondansetron (ZOFRAN-ODT) 4 MG disintegrating tablet   pantoprazole (PROTONIX) 40 MG tablet   Other Relevant Orders   CBC (no diff)     It could be acute gastritis  due to wine consumption prior to the onset of his symptoms. Acute gastritis could irritate the diaphragm and cause excessive hiccups. I recommended starting pantoprazole 40 mg daily and using PRN ondansetron 4 mg every 6-8 hours for the nausea and vomiting. We need to rule out other causes including pancreatitis and GB disease, I have ordered amylase, lipase and CMP. I have ordered CBC to look at the red cell count to make sure he is not having any GI bleeding. Abdominal exam today is benign, but I advised the patient that if his sx worsen he should be evaluated in the ER.   No follow-ups on file.    Farrel Conners, MD

## 2022-04-02 NOTE — Telephone Encounter (Signed)
Spoke with pt, aware of pharmacist recommendations.

## 2022-04-03 ENCOUNTER — Telehealth: Payer: Self-pay | Admitting: Cardiology

## 2022-04-03 NOTE — Telephone Encounter (Signed)
Patient would like to know if Dr. Ellyn Hack thinks he should be on FMLA due to his condition. Please advise.

## 2022-04-03 NOTE — Telephone Encounter (Signed)
Spoke to patient. He states he is developing a lot of back pain when he is working  and  he has the hiccups a lot.   - RN  informed patient  that cardiology  would not be the one to manage any FMLA for these symptoms.    RN  asked patient if notified primary about the frequent hiccups. Patient stated yes, " They gave me some medicine ,but I don't know if it worksClinical biochemist informed patient again that he would need to contact primary to see if a FMLA is needed.  Patient stated Dr Ellyn Hack told him he had a heart attack a year or so ago. RN asked if he was hospitalized for the episode. Patient stated no. RN informed patient the recovery for that diagnosis is usually about 6 months.   Dr Ellyn Hack is treating you for  lower extremity DVT . You have not indicate your having issue with leg.   RN informed patient  will send message to Dr Ellyn Hack for review and called him with response. Patient verbalized understanding.

## 2022-04-04 NOTE — Telephone Encounter (Signed)
Unless he is having heart failure or angina symptoms I do not think that I could be the one to fill out FMLA paperwork.  Would defer to PCP.   Glenetta Hew, MD

## 2022-04-07 NOTE — Telephone Encounter (Signed)
Called left detail message per Dr Ellyn Hack response.   Please contact primary if sympotms are not related to  chest pain or heart failure issues  Any question can call back

## 2022-04-13 ENCOUNTER — Encounter: Payer: Self-pay | Admitting: Family Medicine

## 2022-04-13 ENCOUNTER — Ambulatory Visit (INDEPENDENT_AMBULATORY_CARE_PROVIDER_SITE_OTHER): Payer: Federal, State, Local not specified - PPO | Admitting: Family Medicine

## 2022-04-13 VITALS — BP 110/68 | HR 89 | Temp 98.6°F | Ht 77.0 in | Wt 213.0 lb

## 2022-04-13 DIAGNOSIS — Z23 Encounter for immunization: Secondary | ICD-10-CM | POA: Diagnosis not present

## 2022-04-13 DIAGNOSIS — Z0000 Encounter for general adult medical examination without abnormal findings: Secondary | ICD-10-CM

## 2022-04-13 DIAGNOSIS — Z125 Encounter for screening for malignant neoplasm of prostate: Secondary | ICD-10-CM

## 2022-04-13 LAB — BASIC METABOLIC PANEL
BUN: 15 mg/dL (ref 6–23)
CO2: 23 mEq/L (ref 19–32)
Calcium: 9.1 mg/dL (ref 8.4–10.5)
Chloride: 107 mEq/L (ref 96–112)
Creatinine, Ser: 1.27 mg/dL (ref 0.40–1.50)
GFR: 53.37 mL/min — ABNORMAL LOW (ref >60.00)
Glucose, Bld: 90 mg/dL (ref 70–99)
Potassium: 4 mEq/L (ref 3.5–5.1)
Sodium: 141 mEq/L (ref 135–145)

## 2022-04-13 LAB — CBC WITH DIFFERENTIAL/PLATELET
Basophils Absolute: 0 10*3/uL (ref 0.0–0.1)
Basophils Relative: 0.5 % (ref 0.0–3.0)
Eosinophils Absolute: 0.1 10*3/uL (ref 0.0–0.7)
Eosinophils Relative: 1.6 % (ref 0.0–5.0)
HCT: 43.8 % (ref 39.0–52.0)
Hemoglobin: 14.6 g/dL (ref 13.0–17.0)
Lymphocytes Relative: 28.7 % (ref 12.0–46.0)
Lymphs Abs: 1.4 10*3/uL (ref 0.7–4.0)
MCHC: 33.2 g/dL (ref 30.0–36.0)
MCV: 88.2 fl (ref 78.0–100.0)
Monocytes Absolute: 0.4 10*3/uL (ref 0.1–1.0)
Monocytes Relative: 8.4 % (ref 3.0–12.0)
Neutro Abs: 3 10*3/uL (ref 1.4–7.7)
Neutrophils Relative %: 60.8 % (ref 43.0–77.0)
Platelets: 218 10*3/uL (ref 150.0–400.0)
RBC: 4.96 Mil/uL (ref 4.22–5.81)
RDW: 14.3 % (ref 11.5–15.5)
WBC: 5 10*3/uL (ref 4.0–10.5)

## 2022-04-13 LAB — LIPID PANEL
Cholesterol: 160 mg/dL (ref 0–200)
HDL: 51.6 mg/dL (ref >39.00)
LDL Cholesterol: 98 mg/dL (ref 0–99)
NonHDL: 108.41
Total CHOL/HDL Ratio: 3
Triglycerides: 51 mg/dL (ref 0.0–149.0)
VLDL: 10.2 mg/dL (ref 0.0–40.0)

## 2022-04-13 LAB — PSA: PSA: 5.75 ng/mL — ABNORMAL HIGH (ref 0.10–4.00)

## 2022-04-13 LAB — HEPATIC FUNCTION PANEL
ALT: 23 U/L (ref 0–53)
AST: 24 U/L (ref 0–37)
Albumin: 4 g/dL (ref 3.5–5.2)
Alkaline Phosphatase: 63 U/L (ref 39–117)
Bilirubin, Direct: 0.2 mg/dL (ref 0.0–0.3)
Total Bilirubin: 0.7 mg/dL (ref 0.2–1.2)
Total Protein: 7.1 g/dL (ref 6.0–8.3)

## 2022-04-13 LAB — HEMOGLOBIN A1C: Hgb A1c MFr Bld: 5.6 % (ref 4.6–6.5)

## 2022-04-13 LAB — TSH: TSH: 1.27 u[IU]/mL (ref 0.35–5.50)

## 2022-04-13 MED ORDER — POTASSIUM CHLORIDE ER 10 MEQ PO TBCR: 10.0000 meq | EXTENDED_RELEASE_TABLET | Freq: Every day | ORAL | 11 refills | Status: DC

## 2022-04-13 MED ORDER — FUROSEMIDE 40 MG PO TABS: 40.0000 mg | ORAL_TABLET | Freq: Every day | ORAL | 11 refills | Status: DC

## 2022-04-13 NOTE — Addendum Note (Signed)
Addended by: Wyvonne Lenz on: 04/13/2022 03:20 PM   Modules accepted: Orders

## 2022-04-13 NOTE — Progress Notes (Signed)
Subjective:    Patient ID: Brian Bond, male    DOB: 1940/06/23, 82 y.o.   MRN: [id number]  HPI Here for a well exam. He feels fine except for chronic back pain. He used to be in a pain management program with [place], but he was able to atop taking any narcotic medications.  He now takes ES Tylenol as needed. He was here a few weeks ago for hiccups, but after starting on Pantoprazole these have resolved.    Review of Systems  Constitutional: Negative.   HENT: Negative.    Eyes: Negative.   Respiratory: Negative.    Cardiovascular: Negative.   Gastrointestinal: Negative.   Genitourinary: Negative.   Musculoskeletal:  Positive for back pain.  Skin: Negative.   Neurological: Negative.   Psychiatric/Behavioral: Negative.         Objective:   Physical Exam Constitutional:      General: He is not in acute distress.    Appearance: Normal appearance. He is well-developed. He is not diaphoretic.  HENT:     Head: Normocephalic and atraumatic.     Right Ear: External ear normal.     Left Ear: External ear normal.     Nose: Nose normal.     Mouth/Throat:     Pharynx: No oropharyngeal exudate.  Eyes:     General: No scleral icterus.       Right eye: No discharge.        Left eye: No discharge.     Conjunctiva/sclera: Conjunctivae normal.     Pupils: Pupils are equal, round, and reactive to light.  Neck:     Thyroid: No thyromegaly.     Vascular: No JVD.     Trachea: No tracheal deviation.  Cardiovascular:     Rate and Rhythm: Normal rate and regular rhythm.     Pulses: Normal pulses.     Heart sounds: Normal heart sounds. No murmur heard.    No friction rub. No gallop.     Comments: Occasional ectopy  Pulmonary:     Effort: Pulmonary effort is normal. No respiratory distress.     Breath sounds: Normal breath sounds. No wheezing or rales.  Chest:     Chest wall: No tenderness.  Abdominal:     General: Bowel sounds are normal. There is no distension.     Palpations:  Abdomen is soft. There is no mass.     Tenderness: There is no abdominal tenderness. There is no guarding or rebound.  Genitourinary:    Penis: No tenderness.   Musculoskeletal:        General: No tenderness. Normal range of motion.     Cervical back: Neck supple.  Lymphadenopathy:     Cervical: No cervical adenopathy.  Skin:    General: Skin is warm and dry.     Coloration: Skin is not pale.     Findings: No erythema or rash.  Neurological:     General: No focal deficit present.     Mental Status: He is alert and oriented to person, place, and time.     Cranial Nerves: No cranial nerve deficit.     Motor: No abnormal muscle tone.     Coordination: Coordination normal.     Deep Tendon Reflexes: Reflexes are normal and symmetric. Reflexes normal.  Psychiatric:        Mood and Affect: Mood normal.        Behavior: Behavior normal.        Thought Content:  Thought content normal.        Judgment: Judgment normal.           Assessment & Plan:  Well exam. We discussed diet and exercise. Get fasting labs.  Alysia Penna, MD

## 2022-04-23 DIAGNOSIS — R972 Elevated prostate specific antigen [PSA]: Secondary | ICD-10-CM | POA: Diagnosis not present

## 2022-04-23 DIAGNOSIS — N5201 Erectile dysfunction due to arterial insufficiency: Secondary | ICD-10-CM | POA: Diagnosis not present

## 2022-04-23 DIAGNOSIS — R350 Frequency of micturition: Secondary | ICD-10-CM | POA: Diagnosis not present

## 2022-04-23 DIAGNOSIS — N401 Enlarged prostate with lower urinary tract symptoms: Secondary | ICD-10-CM | POA: Diagnosis not present

## 2022-06-13 ENCOUNTER — Other Ambulatory Visit: Payer: Self-pay | Admitting: Cardiology

## 2022-06-15 NOTE — Telephone Encounter (Signed)
Prescription refill request for Eliquis received. Indication: DVT Last office visit: 10/10/2023[name]  Scr: 1.27, 04/13/2022 Age:  82 yo  Weight:  96.6 kg   Refill sent.

## 2022-06-17 ENCOUNTER — Ambulatory Visit (HOSPITAL_COMMUNITY)
Admission: RE | Admit: 2022-06-17 | Discharge: 2022-06-17 | Disposition: A | Discharge status: Home / Self Care | Payer: Federal, State, Local not specified - PPO | Source: Ambulatory Visit | Attending: Cardiology | Admitting: Cardiology

## 2022-06-17 DIAGNOSIS — I82513 Chronic embolism and thrombosis of femoral vein, bilateral: Secondary | ICD-10-CM

## 2022-06-17 DIAGNOSIS — I83893 Varicose veins of bilateral lower extremities with other complications: Secondary | ICD-10-CM | POA: Diagnosis not present

## 2022-06-17 DIAGNOSIS — I5042 Chronic combined systolic (congestive) and diastolic (congestive) heart failure: Secondary | ICD-10-CM | POA: Insufficient documentation

## 2022-06-17 NOTE — Progress Notes (Signed)
Lower extremity vein Doppler shows persistent old clot in the distal part of the right femoral vein into the popliteal vein (behind the knee) with partial recanalization that is similar compared to September 2023.  With a sizable chronic clot prior study thinners to avoid any progression of disease   Bryan Lemma, MD

## 2022-06-27 ENCOUNTER — Other Ambulatory Visit: Payer: Self-pay | Admitting: Family Medicine

## 2022-06-27 DIAGNOSIS — K219 Gastro-esophageal reflux disease without esophagitis: Secondary | ICD-10-CM

## 2022-06-27 DIAGNOSIS — R066 Hiccough: Secondary | ICD-10-CM

## 2022-07-06 ENCOUNTER — Ambulatory Visit: Payer: Federal, State, Local not specified - PPO | Admitting: Family Medicine

## 2022-07-06 ENCOUNTER — Ambulatory Visit (INDEPENDENT_AMBULATORY_CARE_PROVIDER_SITE_OTHER): Payer: Federal, State, Local not specified - PPO | Admitting: Family Medicine

## 2022-07-06 ENCOUNTER — Encounter: Payer: Self-pay | Admitting: Family Medicine

## 2022-07-06 VITALS — BP 120/80 | HR 62 | Temp 98.4°F | Wt 207.0 lb

## 2022-07-06 DIAGNOSIS — M545 Low back pain, unspecified: Secondary | ICD-10-CM

## 2022-07-06 DIAGNOSIS — G8929 Other chronic pain: Secondary | ICD-10-CM | POA: Diagnosis not present

## 2022-07-06 NOTE — Progress Notes (Signed)
   Subjective:    Patient ID: Brian Bond, male    DOB: 06-Jan-1941, 82 y.o.   MRN: [id number]  HPI Here asking for advice about his work situation. He still works full time at [employer], and his job requires him to stand all day and to stoop to pick up things. We have been treating him for years for chronic low back pain, and he is in a pain management prtotocol with [place]. However he says he cannot continue to work as he is, and is debating whether to retire or not.    Review of Systems  Constitutional: Negative.   Respiratory: Negative.    Cardiovascular: Negative.   Musculoskeletal:  Positive for back pain.       Objective:   Physical Exam Constitutional:      Appearance: Normal appearance.  Cardiovascular:     Rate and Rhythm: Normal rate and regular rhythm.     Pulses: Normal pulses.     Heart sounds: Normal heart sounds.  Pulmonary:     Effort: Pulmonary effort is normal.     Breath sounds: Normal breath sounds.  Neurological:     Mental Status: He is alert.           Assessment & Plan:  He has chronic low back pain and he wants to continue working at least until the end of this year. We wrote him a note to take to his job to have him avoid any lifting greater than 10 lbs and any bending or stooping. We will follow up as needed.  Gershon Crane, MD

## 2022-07-07 ENCOUNTER — Ambulatory Visit: Payer: Federal, State, Local not specified - PPO | Attending: Cardiology | Admitting: Cardiology

## 2022-07-07 ENCOUNTER — Encounter: Payer: Self-pay | Admitting: Cardiology

## 2022-07-07 VITALS — BP 120/70 | HR 55 | Ht 78.0 in | Wt 205.0 lb

## 2022-07-07 DIAGNOSIS — I82511 Chronic embolism and thrombosis of right femoral vein: Secondary | ICD-10-CM | POA: Diagnosis not present

## 2022-07-07 DIAGNOSIS — I5042 Chronic combined systolic (congestive) and diastolic (congestive) heart failure: Secondary | ICD-10-CM | POA: Diagnosis not present

## 2022-07-07 DIAGNOSIS — I83893 Varicose veins of bilateral lower extremities with other complications: Secondary | ICD-10-CM

## 2022-07-07 MED ORDER — APIXABAN 5 MG PO TABS: ORAL_TABLET | ORAL | 3 refills | Status: DC

## 2022-07-07 NOTE — Assessment & Plan Note (Addendum)
On stable meds.  No active symptoms. => I truthfully do not think he has diastolic heart failure.  Class I symptoms.  Echo was read as grade 3 diastolic dysfunction but the LA size was normal which would argue that the LA pressures are not elevated. He has PRN Lasix.  BP is stable without any medications therefore we are not placing him on any medications besides the as needed Lasix.

## 2022-07-07 NOTE — Patient Instructions (Signed)

## 2022-07-07 NOTE — Progress Notes (Signed)
Primary Care Provider: Nelwyn Salisbury, MD [facility] Cardiologist: Bryan Lemma, MD Electrophysiologist: None  Clinic Note: Chief Complaint  Patient presents with   Follow-up    Discussed results of venous Dopplers.  Right leg still swelling   ===================================  ASSESSMENT/PLAN   Problem List Items Addressed This Visit       Cardiology Problems   Varicose veins of leg with edema, bilateral (Chronic)    Continue foot elevation, exercises and thigh-high support stockings.      Relevant Medications   apixaban (ELIQUIS) 5 MG TABS tablet   Chronic deep vein thrombosis (DVT) of femoral vein of right lower extremity (HCC) - Primary (Chronic)    Clearly has chronic DVT with residual thrombus and recanalization in the distal RFV into the TP trunk. Baseline and also having significant venous stasis, I think the best course of action be to continue longstanding DOAC.  No need to recheck Doppler.  Continue Eliquis 5 mg twice daily-okay to hold for procedures or surgeries 48 to 72 hours out depending on procedure.  No need to bridge Continue to recommend thigh-high support hose Continue leg exercises and leg elevation. Continue exercise and walking.        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  No active symptoms. => I truthfully do not think he has diastolic heart failure.  Class I symptoms.  Echo was read as grade 3 diastolic dysfunction but the LA size was normal which would argue that the LA pressures are not elevated. He has PRN Lasix.  BP is stable without any medications therefore we are not placing him on any medications besides the as needed Lasix.      Relevant Medications   apixaban (ELIQUIS) 5 MG TABS tablet    ===================================  HPI:    Brian Bond is a 82 y.o. male with a PMH notable for mildly reduced LV function (EF 45 to 50% with global HK and GR 2 DD, Myoview with Fixed Inferior Defect suggesting prior infarct-NYHA Class I Symptoms) with history of Chronic RLE DVT who presents today for ~6 month f/u at the request of Dr. Gershon Crane.  R LE DVT (Inciting event  - [place] to [place]) => gained 22 lb (205 - 227) with significant BLE swelling R>>L; also 1 episode of prolonged CP/ acute SOB, following the trip  (January) - awoke him from sleep. Began wearing compression stockings  PCP ordered Echo - EF 45-50% with global HK & Gr 2 DD - ? ICM? - NYHA Class 1 Sx. PCP initiated diuretic - but RLE swelling persisted > L LE -> with diuresis, wgt back to 208 lb.  Cardiology C/s Feb 2023:  LE Venous Dopplers 06/02/21: ACUTE RFV-RPopV & TP Trunk DVT (swelling initially noted after a trip with his family up to [place] over Thanksgiving, indicating DVT may have been subacute) Initiated on Eliquis. - VTE Rx dose pack Myoview 04/29/21: INTERMEDIATE RISK ~ 4.6 METS - Max HR 134 -> 95% MPHR: EF 40-45%, FIXED Inferior (basal-apical) defect w/o reversibility (c/w Infarct w/o Ischemia)  [name] was last seen on December 16, 2021 for routine follow-up.  Venous Dopplers were rechecked still showing persistent DVT but with clearance of the TP trunk thrombus.  There is also some recanalization in  the femoral vein.  Still noted some right leg swelling that was not helped with Lasix.  No PND orthopnea to suggest CHF.  Still staying active with no chest pain or pressure or dyspnea.  Just deconditioned.  Wanting to be more active. Continued on Eliquis on recheck lower extremity venous type DVT Dopplers Recommended foot elevation with thigh-high support stockings and foot elevation 3-4 times a day as well as plantarflexion/dorsiflexion exercises.  Recent Hospitalizations: None  Reviewed  CV studies:    The following studies were reviewed today: (if  available, images/films reviewed: From Epic Chart or Care Everywhere) RLE Venous Dopplers 06/17/2022: Chronic right distal femoral vein, right popliteal vein DVT with partial recanalization.  No significant change.  Interval History:   [name] returns here today for follow-up accompanied by his son had multiple questions.  [name] himself has been doing quite fine the swelling is pretty well-controlled still little bit more on the right leg than the left but denies any PND or apnea.  He has been quite sedentary somewhat bothered by knee pain that limits his walking.  He has not been elevating his feet that much.  He has not used any additional furosemide however.  The only medicine he is taking his Eliquis and the Restoril along with Lasix.  The swelling is better with Lasix but still there.  Otherwise relatively normal from a cardiac standpoint.  CV Review of Symptoms (Summary): no chest pain or dyspnea on exertion positive for - edema and more exercise intolerance right now than he had been because of being sedentary. negative for - irregular heartbeat, orthopnea, palpitations, paroxysmal nocturnal dyspnea, rapid heart rate, shortness of breath, or lightheadedness, dizziness, wooziness or syncope/near syncope, TIA/amaurosis fugax, claudication.  He and his son had multiple questions about whether there are any other options besides DOAC's to treat clots.  I explained to them in detail the concern but having chronic thrombus in the leg that extent that there could always be really conventional thrombus due to slow flow in the distal vessel.  The son asked about none pharmacological agents such as natural agents that he mentioned several-but I did not recognize any of the names.  REVIEWED OF SYSTEMS   Review of Systems  Constitutional:  Positive for malaise/fatigue (Mild exercise intolerance.  Has been somewhat sedentary since these diagnoses.). Negative for weight loss (Gaining weight).   HENT:  Negative for congestion and nosebleeds.   Respiratory:  Negative for cough and shortness of breath.   Cardiovascular:  Positive for leg swelling (BLE - R>>L: Better with Lasix, but still persists.).  Gastrointestinal:  Negative for blood in stool and melena.  Genitourinary:  Negative for dysuria and hematuria.  Musculoskeletal:  Negative for back pain and falls.  Neurological:  Positive for tingling (On the right leg). Negative for dizziness, speech change, focal weakness and seizures.  Psychiatric/Behavioral:  Positive for depression. Negative for memory loss. The patient is not nervous/anxious.    I have reviewed and (if needed) personally updated the patient's problem list, medications, allergies, past medical and surgical history, social and family history.   PAST MEDICAL HISTORY   Past Medical History:  Diagnosis Date   Bell's palsy 2014   BPH (benign prostatic hyperplasia)    Chest pain with moderate risk for cardiac etiology 04/23/2021   Deep vein thrombosis (DVT) of femoral vein of right lower extremity (HCC) 06/02/2021   Diagnosed Dopplers 3 days and 23, likely onset of symptoms was in late November 2022 =>  persistent DVT as of November 2023; remains on DOAC   Diverticula, colon    Edema    Insomnia    Low back pain    Osteoarthritis    Skin rash 11/2019   hands waist neck scalp rash and skin peeling    PAST SURGICAL HISTORY   Past Surgical History:  Procedure Laterality Date   broken leg     right leg   COLONOSCOPY  05/09/2013   per Dr. Russella Dar, clear, no repeats needed    FRACTURE SURGERY Right 1974   NM MYOVIEW LTD  04/29/2021   TM Myoview: Poor exercise capacity, achieved 4.6 METS; Max heart rate 134 bpm (95% max age predicted HR) Normal BP response to exercise  No ST deviation was noted;; mostly fixed perfusion defect (medium size, mild intensity) in the apical to basal inferior wall assoc w/ WMA c/w prior infarction.  Moderately reduced EF  ~40-45%.   Mildly enlarged LVEDV& LVESV.  No ISCHEMIA findings c/w PRIOR MI   PROSTATE SURGERY  01/08/2020   per Dr. Jettie Pagan, Holmium laser enucleation with morcellation   TRANSTHORACIC ECHOCARDIOGRAM  03/2021   LVEF 45 to 50%.  Mildly reduced function with global HK.  GRII DD (but normal LA size-suggests that that atrial pressures are not elevated.) .  Mild septal hypertrophy.  Normal RV size and function.  Normal RAP.  Normal aortic and mitral valve.    MEDICATIONS/ALLERGIES   Current Meds  Medication Sig   apixaban (ELIQUIS) 5 MG TABS tablet TAKE 1 TABLET(5 MG) BY MOUTH TWICE DAILY   furosemide (LASIX) 40 MG tablet Take 1 tablet (40 mg total) by mouth daily.   temazepam (RESTORIL) 30 MG capsule TAKE 1 CAPSULE BY MOUTH EVERY NIGHT AT BEDTIME AS NEEDED    No Known Allergies  SOCIAL HISTORY/FAMILY HISTORY   Reviewed in Epic:  Pertinent findings:  Social History   Tobacco Use   Smoking status: Former    Packs/day: 0.50    Years: 5.00    Additional pack years: 0.00    Total pack years: 2.50    Types: Cigarettes    Quit date: 05/03/1991    Years since quitting: 31.2   Smokeless tobacco: Never  Vaping Use   Vaping Use: Never used  Substance Use Topics   Alcohol use: Yes    Alcohol/week: 4.0 standard drinks of alcohol    Types: 4 Cans of beer per week    Comment: occ   Drug use: No   Social History   Social History Narrative   Not on file    OBJCTIVE -PE, EKG, labs   Wt Readings from Last 3 Encounters:  07/07/22 205 lb (93 kg)  07/06/22 207 lb (93.9 kg)  04/13/22 213 lb (96.6 kg)    Physical Exam: BP 120/70   Pulse (!) 55   Ht 6\' 6"  (1.981 m)   Wt 205 lb (93 kg)   BMI 23.69 kg/m  Physical Exam Vitals reviewed.  Constitutional:      General: He is not in acute distress.    Appearance: Normal appearance. He is normal weight. He is not ill-appearing or toxic-appearing.     Comments: Actually seems young for stated age.  Has mild cervical kyphosis.  HENT:      Head: Normocephalic and atraumatic.  Neck:     Vascular: No carotid bruit or JVD.     Comments: Head tilted forward at the neck - kyphosis, has too look up with his eyes Cardiovascular:  Rate and Rhythm: Regular rhythm. Bradycardia present. No extrasystoles are present.    Chest Wall: PMI is not displaced.     Pulses: No decreased pulses (Diminished pulses due to edema).     Heart sounds: S1 normal and S2 normal. Heart sounds are distant. Murmur (Faint 1/6 SEM at RUSB.) heard.     No friction rub. No gallop.  Musculoskeletal:        General: Swelling present.     Right lower leg: Edema (2+ pitting) present.     Left lower leg: No edema.  Skin:    General: Skin is warm and dry.     Comments: Trivial stasis changes in the lower extremities.  Neurological:     General: No focal deficit present.     Mental Status: He is alert and oriented to person, place, and time.     Gait: Gait abnormal.  Psychiatric:        Mood and Affect: Mood normal.        Behavior: Behavior normal.     Comments: I question how much of our discussion he fully comprehends.     Adult ECG Report Sinus bradycardia, rate 55 bpm.  Sinus arrhythmia.  1  AVB.  Nonspecific IVCD.  Stable.  Recent Labs:  Lab Results  Component Value Date   CHOL 160 04/13/2022   HDL 51.60 04/13/2022   LDLCALC 98 04/13/2022   TRIG 51.0 04/13/2022   CHOLHDL 3 04/13/2022   Lab Results  Component Value Date   CREATININE 1.27 04/13/2022   BUN 15 04/13/2022   NA 141 04/13/2022   K 4.0 04/13/2022   CL 107 04/13/2022   CO2 23 04/13/2022      Latest Ref Rng & Units 04/13/2022    2:42 PM 04/01/2022   12:07 PM 08/29/2021    2:29 PM  CBC  WBC 4.0 - 10.5 K/uL 5.0  7.7  5.0   Hemoglobin 13.0 - 17.0 g/dL 16.1  09.6  04.5   Hematocrit 39.0 - 52.0 % 43.8  43.6  48.4   Platelets 150.0 - 400.0 K/uL 218.0  237.0  234.0  C    C Corrected result    Lab Results  Component Value Date   HGBA1C 5.6 04/13/2022   Lab Results  Component  Value Date   TSH 1.27 04/13/2022    ================================================== I spent a total of 30 minutes with the patient spent in direct patient consultation.  Mostly the patient's son speaking with multiple questions asked and answered.  Trying to find ways to avoid being on anticoagulation.  The patient himself does not understand why the clot did not fully resolve.  I spent a long time explaining to him that longstanding clot they were diagnosed several months after the onset of formation, may never fully clear.  With these being leg clots not involving the common femoral vein etc. there prominent enough to consider thrombectomy. Additional time spent with chart review  / charting (studies, outside notes, etc): 18 min Total Time: 48 min  Current medicines are reviewed at length with the patient today.  (+/- concerns) n/a  Notice: This dictation was prepared with Dragon dictation along with smart phrase technology. Any transcriptional errors that result from this process are unintentional and may not be corrected upon review.  Studies Ordered:   Orders Placed This Encounter  Procedures   EKG 12-Lead   Meds ordered this encounter  Medications   apixaban (ELIQUIS) 5 MG TABS tablet  Sig: TAKE 1 TABLET(5 MG) BY MOUTH TWICE DAILY    Dispense:  180 tablet    Refill:  3    Patient Instructions / Medication Changes & Studies & Tests Ordered   Patient Instructions  Medication Instructions:  No changes  *If you need a refill on your cardiac medications before your next appointment, please call your pharmacy*   Lab Work:  Not needed   Testing/Procedures: Not needed   Follow-Up: At [facility], you and your health needs are our priority.  As part of our continuing mission to provide you with exceptional heart care, we have created designated Provider Care Teams.  These Care Teams include your primary Cardiologist (physician) and Advanced Practice Providers (APPs  -  Physician Assistants and Nurse Practitioners) who all work together to provide you with the care you need, when you need it.     Your next appointment:   12 month(s)  The format for your next appointment:   In Person  Provider:   Bryan Lemma, MD      Marykay Lex, MD, MS Bryan Lemma, M.D., M.S. Interventional Cardiologist  [facility]  Pager # [phone] Phone # [phone] [address] [place], [place] [address]   Thank you for choosing [facility]!!

## 2022-07-07 NOTE — Assessment & Plan Note (Signed)
Continue foot elevation, exercises and thigh-high support stockings.

## 2022-07-07 NOTE — Assessment & Plan Note (Signed)
Clearly has chronic DVT with residual thrombus and recanalization in the distal RFV into the TP trunk. Baseline and also having significant venous stasis, I think the best course of action be to continue longstanding DOAC.  No need to recheck Doppler.  Continue Eliquis 5 mg twice daily-okay to hold for procedures or surgeries 48 to 72 hours out depending on procedure.  No need to bridge Continue to recommend thigh-high support hose Continue leg exercises and leg elevation. Continue exercise and walking.

## 2022-07-18 ENCOUNTER — Encounter: Payer: Self-pay | Admitting: Cardiology

## 2022-10-08 ENCOUNTER — Telehealth: Payer: Self-pay | Admitting: Family Medicine

## 2022-10-08 NOTE — Telephone Encounter (Signed)
Prescription Request  10/08/2022  LOV: 07/06/2022  What is the name of the medication or equipment? temazepam (RESTORIL) 30 MG capsule   Have you contacted your pharmacy to request a refill? No   Which pharmacy would you like this sent to?  [id number] - [place], [place] - [address] AT [facility] Phone: [phone]  Fax: [phone]     Patient notified that their request is being sent to the clinical staff for review and that they should receive a response within 2 business days.   Please advise at Mobile [phone] (mobile)

## 2022-10-09 MED ORDER — TEMAZEPAM 30 MG PO CAPS: 30.0000 mg | ORAL_CAPSULE | Freq: Every day | ORAL | 1 refills | Status: DC

## 2022-11-10 ENCOUNTER — Ambulatory Visit (INDEPENDENT_AMBULATORY_CARE_PROVIDER_SITE_OTHER): Payer: Federal, State, Local not specified - PPO | Admitting: Family Medicine

## 2022-11-10 ENCOUNTER — Ambulatory Visit (INDEPENDENT_AMBULATORY_CARE_PROVIDER_SITE_OTHER): Payer: Federal, State, Local not specified - PPO

## 2022-11-10 ENCOUNTER — Encounter: Payer: Self-pay | Admitting: Family Medicine

## 2022-11-10 VITALS — BP 120/80 | HR 71 | Temp 98.2°F | Wt 204.0 lb

## 2022-11-10 DIAGNOSIS — M25552 Pain in left hip: Secondary | ICD-10-CM

## 2022-11-10 DIAGNOSIS — I83893 Varicose veins of bilateral lower extremities with other complications: Secondary | ICD-10-CM

## 2022-11-10 DIAGNOSIS — M25551 Pain in right hip: Secondary | ICD-10-CM

## 2022-11-10 DIAGNOSIS — G8929 Other chronic pain: Secondary | ICD-10-CM | POA: Diagnosis not present

## 2022-11-10 DIAGNOSIS — I82511 Chronic embolism and thrombosis of right femoral vein: Secondary | ICD-10-CM

## 2022-11-10 DIAGNOSIS — I5042 Chronic combined systolic (congestive) and diastolic (congestive) heart failure: Secondary | ICD-10-CM | POA: Diagnosis not present

## 2022-11-10 DIAGNOSIS — M51379 Other intervertebral disc degeneration, lumbosacral region without mention of lumbar back pain or lower extremity pain: Secondary | ICD-10-CM

## 2022-11-10 DIAGNOSIS — Z23 Encounter for immunization: Secondary | ICD-10-CM | POA: Diagnosis not present

## 2022-11-10 DIAGNOSIS — M47817 Spondylosis without myelopathy or radiculopathy, lumbosacral region: Secondary | ICD-10-CM | POA: Diagnosis not present

## 2022-11-10 DIAGNOSIS — M159 Polyosteoarthritis, unspecified: Secondary | ICD-10-CM

## 2022-11-10 DIAGNOSIS — M150 Primary generalized (osteo)arthritis: Secondary | ICD-10-CM

## 2022-11-10 DIAGNOSIS — M5137 Other intervertebral disc degeneration, lumbosacral region: Secondary | ICD-10-CM

## 2022-11-10 MED ORDER — ASPIRIN 81 MG PO TBEC: 81.0000 mg | DELAYED_RELEASE_TABLET | Freq: Every day | ORAL | 0 refills | Status: AC

## 2022-11-10 MED ORDER — TEMAZEPAM 30 MG PO CAPS: 30.0000 mg | ORAL_CAPSULE | Freq: Every day | ORAL | 1 refills | Status: DC

## 2022-11-10 NOTE — Addendum Note (Signed)
Addended by: Carola Rhine on: 11/10/2022 10:57 AM   Modules accepted: Orders

## 2022-11-10 NOTE — Progress Notes (Signed)
   Subjective:    Patient ID: JODIE RIGLEY, male    DOB: 1940/11/25, 82 y.o.   MRN: [id number]  HPI Here fir a refill and to discuss his hip pain. He has dealt with low back pain for years, and lately he has had more pain in the hips as well. He plans to retire in the near future. He has decided to stop taking all his medications except for the Temazepam because he is "tired of taking pills". Insead of taking lasix for his leg swelling, he is now wearing support stockings. He is also planning on seeing Clide Dales DC for chiropractic care.    Review of Systems  Constitutional: Negative.   Respiratory: Negative.    Cardiovascular:  Positive for leg swelling. Negative for chest pain and palpitations.  Musculoskeletal:  Positive for arthralgias and back pain.       Objective:   Physical Exam Constitutional:      Appearance: Normal appearance.  Cardiovascular:     Rate and Rhythm: Normal rate and regular rhythm.     Pulses: Normal pulses.     Heart sounds: Normal heart sounds.  Pulmonary:     Effort: Pulmonary effort is normal.     Breath sounds: Normal breath sounds.  Neurological:     Mental Status: He is alert.           Assessment & Plan:  He has chronic low back pain, and he now has bilateral hip pain as well, likely due to OA. We will get Xrays of his hips today since these have never been imaged. He will try chiropractic treatments rather than medications to control the pain. Since he has stopped taking Eliquis, I suggested he take an ASA 81 mg daily. He will wear support stockings for leg edema.  Gershon Crane, MD

## 2023-01-07 ENCOUNTER — Encounter: Payer: Self-pay | Admitting: Podiatry

## 2023-01-07 ENCOUNTER — Ambulatory Visit: Payer: Federal, State, Local not specified - PPO | Admitting: Podiatry

## 2023-01-07 ENCOUNTER — Ambulatory Visit (INDEPENDENT_AMBULATORY_CARE_PROVIDER_SITE_OTHER): Payer: Federal, State, Local not specified - PPO

## 2023-01-07 DIAGNOSIS — S91209A Unspecified open wound of unspecified toe(s) with damage to nail, initial encounter: Secondary | ICD-10-CM

## 2023-01-07 DIAGNOSIS — S90219A Contusion of unspecified great toe with damage to nail, initial encounter: Secondary | ICD-10-CM

## 2023-01-07 DIAGNOSIS — M7751 Other enthesopathy of right foot: Secondary | ICD-10-CM | POA: Diagnosis not present

## 2023-01-07 DIAGNOSIS — S90212A Contusion of left great toe with damage to nail, initial encounter: Secondary | ICD-10-CM

## 2023-01-07 NOTE — Patient Instructions (Signed)

## 2023-01-07 NOTE — Progress Notes (Signed)
Subjective:  Patient ID: Brian Bond, male    DOB: 1940-12-09,  MRN: [id number]  Chief Complaint  Patient presents with   Toe Pain    PATIENT STATES HAT HE HIT HIS TOE ON THE JOB AND CRACKED HIS TOE NAIL, A LITTLE DRAINING AND PATIENT STATES THAT IT HURTS REALLY BAD .  LEFT HALLUX TOE NAIL STILL HAS BLOOD AROUND IT AND THE NAIL LOOKS LIKE IT COMING OFF , PATIENT STATES HE HAS NOT TAKEN ANY PAIN FOR MEDICATION     82 y.o. male presents with concern for pain in his left great toe.  He has injury to his nail on the job.  Had the nail lifted off the nailbed.  Has been draining and bleeding.  Has significant pain with any pressure on the nail at this point in time.  Does feel loose to him  Past Medical History:  Diagnosis Date   Bell's palsy 2014   BPH (benign prostatic hyperplasia)    Chest pain with moderate risk for cardiac etiology 04/23/2021   Deep vein thrombosis (DVT) of femoral vein of right lower extremity (HCC) 06/02/2021   Diagnosed Dopplers 3 days and 23, likely onset of symptoms was in late November 2022 => persistent DVT as of November 2023; remains on DOAC   Diverticula, colon    Edema    Insomnia    Low back pain    Osteoarthritis    Skin rash 11/2019   hands waist neck scalp rash and skin peeling    No Known Allergies  ROS: Negative except as per HPI above  Objective:  General: AAO x3, NAD  Dermatological: Onycholysis and injury to the left hallux nail.  There is bleeding underneath the nail.  Vascular:  Dorsalis Pedis artery and Posterior Tibial artery pedal pulses are 2/4 bilateral.  Capillary fill time < 3 sec to all digits.   Neruologic: Grossly intact via light touch bilateral. Protective threshold intact to all sites bilateral.   Musculoskeletal: No gross boney pedal deformities bilateral. No pain, crepitus, or limitation noted with foot and ankle range of motion bilateral. Muscular strength 5/5 in all groups tested bilateral.  Gait: Unassisted,  Nonantalgic.   No images are attached to the encounter.  Radiographs:  Date: 01/07/2023 XR the left foot Weightbearing AP/Lateral/Oblique   Findings: No evidence of fracture in the left hallux  Assessment:   1. Subungual hematoma of great toe   2. Avulsion of toenail, initial encounter      Plan:  Patient was evaluated and treated and all questions answered.  At this time, recommended partial nail removal without chemical matricectomy to the left hallux due to traumatic avulsion. Risks and complications were discussed with the patient for which they understand and  verbally consent to the procedure. Under sterile conditions a total of 3 mL of a mixture of 2% lidocaine plain and 0.5% Marcaine plain was infiltrated in a hallux block fashion. Once anesthetized, the skin was prepped in sterile fashion. A tourniquet was then applied. Next the total left hallux nail border was sharply excised making sure to remove the entire offending nail border. Once the nail was  Removed, the area was debrided and the underlying skin was intact. The area was irrigated and hemostasis was obtained.  A dry sterile dressing was applied. After application of the dressing the tourniquet was removed and there is found to be an immediate capillary refill time to the digit. The patient tolerated the procedure well any complications. Post procedure instructions  were discussed the patient for which he verbally understood. Follow-up in one week for nail check or sooner if any problems are to arise. Discussed signs/symptoms of worsening infection and directed to call the office immediately should any occur or go directly to the emergency room. In the meantime, encouraged to call the office with any questions, concerns, changes symptoms.   Return in about 2 weeks (around 01/21/2023) for nail check.          Corinna Gab, DPM [facility] / [facility]

## 2023-01-11 ENCOUNTER — Telehealth: Payer: Self-pay | Admitting: Podiatry

## 2023-01-11 ENCOUNTER — Encounter: Payer: Self-pay | Admitting: Podiatry

## 2023-01-11 NOTE — Telephone Encounter (Signed)
Pt called and is needing a note stating that he was in the office last Thursday and was out of work Thursday and Friday of last week and returned today. He would like it faxed to Mr Rubye Oaks at his job. The fax number is [phone]. Is it ok to provide note

## 2023-01-11 NOTE — Telephone Encounter (Signed)
Letter faxed and pt notified.

## 2023-01-20 ENCOUNTER — Telehealth: Payer: Self-pay | Admitting: Podiatry

## 2023-01-20 NOTE — Telephone Encounter (Signed)
Called Brian Bond @ [phone] to discuss the injury he had at work (his left big toe).  Completed the paperwork and faxed to the Maintenance Department ([employer]) -- at [phone] - Attn:  Vear Clock.  Called office back [phone]) -- Trey Paula vfyd receipt of fax.       J. Abbott-- 01/20/2023

## 2023-01-22 ENCOUNTER — Ambulatory Visit: Payer: Federal, State, Local not specified - PPO | Admitting: Podiatry

## 2023-01-22 ENCOUNTER — Encounter: Payer: Self-pay | Admitting: Podiatry

## 2023-01-22 VITALS — Ht 78.0 in | Wt 204.0 lb

## 2023-01-22 DIAGNOSIS — S90219A Contusion of unspecified great toe with damage to nail, initial encounter: Secondary | ICD-10-CM

## 2023-01-22 DIAGNOSIS — S91209A Unspecified open wound of unspecified toe(s) with damage to nail, initial encounter: Secondary | ICD-10-CM

## 2023-01-22 MED ORDER — CEPHALEXIN 500 MG PO CAPS: 500.0000 mg | ORAL_CAPSULE | Freq: Three times a day (TID) | ORAL | 0 refills | Status: AC

## 2023-01-22 NOTE — Progress Notes (Signed)
Subjective: Brian Bond is a 82 y.o.  male returns to office today for follow up evaluation after having left Hallux total nail avulsion approximately 2 weeks ago. Patient has been soaking using epsom salts and applying topical antibiotic covered with bandaid daily. Patient denies fevers, chills, nausea, vomiting. Denies any calf pain, chest pain, SOB.   Objective:  Vitals: Reviewed  General: Well developed, nourished, in no acute distress, alert and oriented x3   Dermatology: Skin is warm, dry and supple bilateral. left hallux nail border appears to be clean,slightly macerated, no scab yet. There is no surrounding erythema, edema, drainage/purulence. The remaining nails appear unremarkable at this time. There are no other lesions or other signs of infection present.  Neurovascular status: Intact. No lower extremity swelling; No pain with calf compression bilateral.  Musculoskeletal: Decreased tenderness to palpation of the left hallux nail bed. Muscular strength within normal limits bilateral.   Assesement and Plan: S/p avulsion to the  left hallux nail , doing well.   -Continue soaking in epsom salts twice a day followed by antibiotic ointment and a band-aid. Can leave uncovered at night. Continue this until completely healed.  -E rx for keflex 5 days -If the area has not healed in 2 weeks, call the office for follow-up appointment, or sooner if any problems arise.  -Monitor for any signs/symptoms of infection. Call the office immediately if any occur or go directly to the emergency room. Call with any questions/concerns.        Corinna Gab, DPM [facility] / [facility]                   01/22/2023

## 2023-02-08 ENCOUNTER — Telehealth: Payer: Self-pay

## 2023-02-08 NOTE — Telephone Encounter (Signed)
Patient called and left a message. He had his left toenail removed about a month ago. The skin seems very dark, and he has had itching and clear drainage - Is this worrisome for infection? He is concerned. Please advise

## 2023-02-09 NOTE — Telephone Encounter (Signed)
Pt called back and gave him the information about getting the tinactin spray and he did ask about antibiotics and per the note from the provider is it probably fungal and does not require antibiotics. But to call if anything changes.

## 2023-03-05 ENCOUNTER — Encounter: Payer: Self-pay | Admitting: Family Medicine

## 2023-03-05 ENCOUNTER — Ambulatory Visit: Payer: Federal, State, Local not specified - PPO | Admitting: Family Medicine

## 2023-03-05 VITALS — BP 108/70 | HR 54 | Temp 98.2°F | Wt 209.0 lb

## 2023-03-05 DIAGNOSIS — L03032 Cellulitis of left toe: Secondary | ICD-10-CM | POA: Diagnosis not present

## 2023-03-05 MED ORDER — DOXYCYCLINE HYCLATE 100 MG PO TABS: 100.0000 mg | ORAL_TABLET | Freq: Two times a day (BID) | ORAL | 0 refills | Status: DC

## 2023-03-05 NOTE — Progress Notes (Signed)
   Subjective:    Patient ID: Brian Bond, male    DOB: 02-21-1941, 82 y.o.   MRN: [id number]  HPI Here to check his left great toe. In mid October he struck this toe while on the job and split the nail down the middle. When it became painful he saw Dr. Annamary Rummage (podiatry) on 01-07-23. That day he removed the entire nail. [name] saw him again on 01-22-23 with concerns of possible infection, and he was given 5 days of Keflex. Since then he has been cleaning the toe with soap and water daily, then applying alcohol and Betadine, then dressing it with a Bandaid. He is here today because the base of the toe is oozing a small amount of clear fluid. There is no pain.    Review of Systems  Constitutional: Negative.   Respiratory: Negative.    Cardiovascular: Negative.   Skin:  Positive for color change.       Objective:   Physical Exam Constitutional:      Appearance: Normal appearance.  Cardiovascular:     Rate and Rhythm: Normal rate and regular rhythm.     Pulses: Normal pulses.     Heart sounds: Normal heart sounds.  Pulmonary:     Effort: Pulmonary effort is normal.     Breath sounds: Normal breath sounds.  Skin:    Comments: The left great toenail is S/P a nail removal, and about 1/4 of a new nail is growing at the nail base. The skin on the dorsal toe is dark and macerated. No tenderness.   Neurological:     Mental Status: He is alert.           Assessment & Plan:  After the toenail removal, he has been dressing the toe in ways that have trapped moisture in the skin tissues. I advised him to stop applying any type of ointment or liquid, and to stop applying dressings. The skin should clear up if it is allowed to get air and dry out. We will also cover with 10 days of Doxycycline. Recheck as needed.  Gershon Crane, MD

## 2023-03-22 ENCOUNTER — Telehealth: Payer: Self-pay

## 2023-03-22 DIAGNOSIS — R739 Hyperglycemia, unspecified: Secondary | ICD-10-CM

## 2023-03-22 DIAGNOSIS — N138 Other obstructive and reflux uropathy: Secondary | ICD-10-CM

## 2023-03-22 DIAGNOSIS — N1831 Chronic kidney disease, stage 3a: Secondary | ICD-10-CM

## 2023-03-22 NOTE — Telephone Encounter (Signed)
 Copied from CRM [id number]. Topic: Clinical - Request for Lab/Test Order >> Mar 22, 2023  2:33 PM Corin V wrote: Reason for CRM: Patient has physical 04/16/23 and is requesting to have labs ordered and a call back to schedule those.

## 2023-03-24 NOTE — Addendum Note (Signed)
 Addended by: Corita Diego A on: 03/24/2023 05:07 PM   Modules accepted: Orders

## 2023-03-24 NOTE — Telephone Encounter (Signed)
The lab orders are ready

## 2023-03-26 NOTE — Telephone Encounter (Signed)
Left pt a message with advise to call the office and schedule a lab appointment

## 2023-04-16 ENCOUNTER — Ambulatory Visit: Payer: Federal, State, Local not specified - PPO | Admitting: Family Medicine

## 2023-04-16 ENCOUNTER — Encounter: Payer: Self-pay | Admitting: Family Medicine

## 2023-04-16 VITALS — BP 118/72 | HR 64 | Temp 98.4°F | Ht 78.0 in | Wt 210.4 lb

## 2023-04-16 DIAGNOSIS — N1831 Chronic kidney disease, stage 3a: Secondary | ICD-10-CM

## 2023-04-16 DIAGNOSIS — N401 Enlarged prostate with lower urinary tract symptoms: Secondary | ICD-10-CM

## 2023-04-16 DIAGNOSIS — R739 Hyperglycemia, unspecified: Secondary | ICD-10-CM | POA: Diagnosis not present

## 2023-04-16 DIAGNOSIS — N138 Other obstructive and reflux uropathy: Secondary | ICD-10-CM | POA: Diagnosis not present

## 2023-04-16 DIAGNOSIS — Z0000 Encounter for general adult medical examination without abnormal findings: Secondary | ICD-10-CM | POA: Diagnosis not present

## 2023-04-16 LAB — LIPID PANEL
Cholesterol: 153 mg/dL (ref 0–200)
HDL: 57.8 mg/dL (ref >39.00)
LDL Cholesterol: 87 mg/dL (ref 0–99)
NonHDL: 95.4
Total CHOL/HDL Ratio: 3
Triglycerides: 44 mg/dL (ref 0.0–149.0)
VLDL: 8.8 mg/dL (ref 0.0–40.0)

## 2023-04-16 LAB — CBC WITH DIFFERENTIAL/PLATELET
Basophils Absolute: 0 10*3/uL (ref 0.0–0.1)
Basophils Relative: 0.5 % (ref 0.0–3.0)
Eosinophils Absolute: 0.1 10*3/uL (ref 0.0–0.7)
Eosinophils Relative: 2.8 % (ref 0.0–5.0)
HCT: 44.1 % (ref 39.0–52.0)
Hemoglobin: 14.6 g/dL (ref 13.0–17.0)
Lymphocytes Relative: 32.2 % (ref 12.0–46.0)
Lymphs Abs: 1.5 10*3/uL (ref 0.7–4.0)
MCHC: 33.2 g/dL (ref 30.0–36.0)
MCV: 90 fL (ref 78.0–100.0)
Monocytes Absolute: 0.4 10*3/uL (ref 0.1–1.0)
Monocytes Relative: 9.2 % (ref 3.0–12.0)
Neutro Abs: 2.5 10*3/uL (ref 1.4–7.7)
Neutrophils Relative %: 55.3 % (ref 43.0–77.0)
Platelets: 198 10*3/uL (ref 150.0–400.0)
RBC: 4.9 Mil/uL (ref 4.22–5.81)
RDW: 13.8 % (ref 11.5–15.5)
WBC: 4.6 10*3/uL (ref 4.0–10.5)

## 2023-04-16 LAB — HEPATIC FUNCTION PANEL
ALT: 21 U/L (ref 0–53)
AST: 24 U/L (ref 0–37)
Albumin: 4.1 g/dL (ref 3.5–5.2)
Alkaline Phosphatase: 70 U/L (ref 39–117)
Bilirubin, Direct: 0.2 mg/dL (ref 0.0–0.3)
Total Bilirubin: 0.7 mg/dL (ref 0.2–1.2)
Total Protein: 7 g/dL (ref 6.0–8.3)

## 2023-04-16 LAB — BASIC METABOLIC PANEL
BUN: 21 mg/dL (ref 6–23)
CO2: 26 meq/L (ref 19–32)
Calcium: 9.2 mg/dL (ref 8.4–10.5)
Chloride: 109 meq/L (ref 96–112)
Creatinine, Ser: 1.35 mg/dL (ref 0.40–1.50)
GFR: 49.25 mL/min — ABNORMAL LOW (ref >60.00)
Glucose, Bld: 93 mg/dL (ref 70–99)
Potassium: 4.6 meq/L (ref 3.5–5.1)
Sodium: 144 meq/L (ref 135–145)

## 2023-04-16 LAB — TSH: TSH: 1.07 u[IU]/mL (ref 0.35–5.50)

## 2023-04-16 LAB — PSA: PSA: 6.98 ng/mL — ABNORMAL HIGH (ref 0.10–4.00)

## 2023-04-16 LAB — HEMOGLOBIN A1C: Hgb A1c MFr Bld: 5.9 % (ref 4.6–6.5)

## 2023-04-16 MED ORDER — TEMAZEPAM 30 MG PO CAPS: 30.0000 mg | ORAL_CAPSULE | Freq: Every day | ORAL | 1 refills | Status: DC

## 2023-04-16 NOTE — Progress Notes (Signed)
 Subjective:    Patient ID: Brian Bond, male    DOB: 12-Jun-1940, 83 y.o.   MRN: [id number]  HPI Here for a well exam. He feels fairly well. His back pain and OA are stable. He is still working full time.    Review of Systems  Constitutional: Negative.   HENT: Negative.    Eyes: Negative.   Respiratory: Negative.    Cardiovascular: Negative.   Gastrointestinal: Negative.   Genitourinary: Negative.   Musculoskeletal:  Positive for arthralgias and back pain.  Skin: Negative.   Neurological: Negative.   Psychiatric/Behavioral: Negative.         Objective:   Physical Exam Constitutional:      General: He is not in acute distress.    Appearance: Normal appearance. He is well-developed. He is not diaphoretic.  HENT:     Head: Normocephalic and atraumatic.     Right Ear: External ear normal.     Left Ear: External ear normal.     Nose: Nose normal.     Mouth/Throat:     Pharynx: No oropharyngeal exudate.  Eyes:     General: No scleral icterus.       Right eye: No discharge.        Left eye: No discharge.     Conjunctiva/sclera: Conjunctivae normal.     Pupils: Pupils are equal, round, and reactive to light.  Neck:     Thyroid : No thyromegaly.     Vascular: No JVD.     Trachea: No tracheal deviation.  Cardiovascular:     Rate and Rhythm: Normal rate and regular rhythm.     Pulses: Normal pulses.     Heart sounds: Normal heart sounds. No murmur heard.    No friction rub. No gallop.  Pulmonary:     Effort: Pulmonary effort is normal. No respiratory distress.     Breath sounds: Normal breath sounds. No wheezing or rales.  Chest:     Chest wall: No tenderness.  Abdominal:     General: Bowel sounds are normal. There is no distension.     Palpations: Abdomen is soft. There is no mass.     Tenderness: There is no abdominal tenderness. There is no guarding or rebound.  Genitourinary:    Penis: No tenderness.   Musculoskeletal:        General: No tenderness. Normal  range of motion.     Cervical back: Neck supple.  Lymphadenopathy:     Cervical: No cervical adenopathy.  Skin:    General: Skin is warm and dry.     Coloration: Skin is not pale.     Findings: No erythema or rash.  Neurological:     General: No focal deficit present.     Mental Status: He is alert and oriented to person, place, and time.     Cranial Nerves: No cranial nerve deficit.     Motor: No abnormal muscle tone.     Coordination: Coordination normal.     Deep Tendon Reflexes: Reflexes are normal and symmetric. Reflexes normal.  Psychiatric:        Mood and Affect: Mood normal.        Behavior: Behavior normal.        Thought Content: Thought content normal.        Judgment: Judgment normal.           Assessment & Plan:  Well exam. We discussed diet and exercise. Get fasting labs. Garnette Olmsted, MD

## 2023-04-27 ENCOUNTER — Ambulatory Visit: Payer: Federal, State, Local not specified - PPO | Admitting: Podiatry

## 2023-04-27 ENCOUNTER — Encounter: Payer: Self-pay | Admitting: Podiatry

## 2023-04-27 DIAGNOSIS — B353 Tinea pedis: Secondary | ICD-10-CM | POA: Diagnosis not present

## 2023-04-27 DIAGNOSIS — L233 Allergic contact dermatitis due to drugs in contact with skin: Secondary | ICD-10-CM | POA: Diagnosis not present

## 2023-04-27 MED ORDER — CLOTRIMAZOLE-BETAMETHASONE 1-0.05 % EX CREA
1.0000 | TOPICAL_CREAM | Freq: Every day | CUTANEOUS | 0 refills | Status: DC
Start: 2023-04-27 — End: 2023-04-29

## 2023-04-27 NOTE — Progress Notes (Signed)
Subjective:  Patient ID: Brian Bond, male    DOB: 09/03/1940,  MRN: [id number]  Chief Complaint  Patient presents with   Ingrown Toenail    had great left toenail removed 10/31 still scabbed over and only sweat is coming out cause rash on top of foot and skin breakdown    83 y.o. male presents with concern for wound to the left foot with some skin irritation between the toes as well.  He had his nail removed October 2024.  He has still been applying some Neosporin ointment to the area as well as using Betadine to cleanse the foot.  He does report that there is a new rash on the top of the foot and some moisture that drains through the foot.  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  Appears more so as a dermatitis and possible allergic reaction.  The left hallux nail is beginning to regrow and the toe actually appears very healthy with no significant erythema edema or drainage from the nailbed.  There is noted to be maceration interdigitally with evidence of interdigital tinea pedis.  Significant xerosis of the dorsal skin  Vascular:  Dorsalis Pedis artery and Posterior Tibial artery pedal pulses are 2/4 bilateral.  Capillary fill time < 3  sec to all digits.   Neruologic: Grossly intact via light touch bilateral. Protective threshold intact to all sites bilateral.   Musculoskeletal: No gross boney pedal deformities bilateral. No pain, crepitus, or limitation noted with foot and ankle range of motion bilateral. Muscular strength 5/5 in all groups tested bilateral.  Gait: Unassisted, Nonantalgic.     Radiographs:  Deferred Assessment:   1. Tinea pedis, left   2. Allergic contact dermatitis due to drugs in contact with skin      Plan:  Patient was evaluated and treated and all questions answered.  # Possible allergic reaction to Neosporin -Patient's dorsal foot rash appears to be related to possible allergic reaction I favor Neosporin as the causative agent -Also complicated by possible tinea pedis superinfection -Will treat with steroid and antifungal combination therapy as below eRx sent to the patient's pharmacy -Recommend complete cessation of any treatment to the left hallux nail which appears well-healed at this time  # Interdigital tinea pedis of left foot Discussed the etiology and treatment options for tinea pedis.  Discussed topical and oral treatment.  Recommended topical treatment with clotrimazole/betamethasone cream.  This was sent to the patient's pharmacy. Also recommend use of OTC antifungal powder which pt has already. Also discussed appropriate foot hygiene, use of antifungal spray such as Tinactin in shoes, as well as cleaning her foot  surfaces such as showers and bathroom floors with bleach.   Return in about 2 weeks (around 05/11/2023).          Corinna Gab, DPM [facility] / [facility]

## 2023-04-29 ENCOUNTER — Other Ambulatory Visit: Payer: Self-pay | Admitting: Podiatry

## 2023-04-29 ENCOUNTER — Telehealth: Payer: Self-pay | Admitting: Podiatry

## 2023-04-29 MED ORDER — CLOTRIMAZOLE-BETAMETHASONE 1-0.05 % EX CREA: 1.0000 | TOPICAL_CREAM | Freq: Every day | CUTANEOUS | 0 refills | Status: AC

## 2023-04-29 NOTE — Telephone Encounter (Signed)
Pt is currently having swelling in his L foot. He would like to know if the clotrimazole-betamethasone medications can be resent to the [facility] on [address] as the pharmacy currently sent to does not have the medications ready yet.

## 2023-05-11 ENCOUNTER — Ambulatory Visit (INDEPENDENT_AMBULATORY_CARE_PROVIDER_SITE_OTHER): Payer: Federal, State, Local not specified - PPO | Admitting: Podiatry

## 2023-05-11 ENCOUNTER — Encounter: Payer: Self-pay | Admitting: Podiatry

## 2023-05-11 DIAGNOSIS — B353 Tinea pedis: Secondary | ICD-10-CM | POA: Diagnosis not present

## 2023-05-11 DIAGNOSIS — L233 Allergic contact dermatitis due to drugs in contact with skin: Secondary | ICD-10-CM | POA: Diagnosis not present

## 2023-05-11 MED ORDER — KETOCONAZOLE 2 % EX CREA: 1.0000 | TOPICAL_CREAM | Freq: Every day | CUTANEOUS | 2 refills | Status: AC

## 2023-05-11 NOTE — Progress Notes (Signed)
  Subjective:  Patient ID: Brian Bond, male    DOB: [birth year],  MRN: [id number]  Chief Complaint  Patient presents with   Tinea Pedis    2WK F/U for Interdigital tinea pedis of left foot    83 y.o. male presents for follow-up on left foot interdigital tinea pedis and dorsal allergic reaction rash versus tinea pedis rash.  He was using clotrimazole betamethasone combination cream which seem to help him a lot.  He states there is no pain in the foot at this time no itching either.  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  No drainage.  No xerosis noted much improved from prior    Vascular:  Dorsalis Pedis artery and Posterior Tibial artery pedal pulses are 2/4 bilateral.  Capillary fill time < 3 sec to all digits.   Neruologic: Grossly intact via light touch bilateral. Protective threshold intact to all sites bilateral.   Musculoskeletal: No gross boney pedal deformities bilateral. No pain, crepitus, or limitation noted with foot and ankle range of motion bilateral. Muscular strength 5/5 in all groups tested bilateral.  Gait: Unassisted, Nonantalgic.     Radiographs:  Deferred Assessment:   1. Tinea pedis, left   2. 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  # Possible allergic reaction to Neosporin -Continue cessation of any Neosporin treatment  # Interdigital tinea pedis of left foot -Much improved after use of clotrimazole/betamethasone cream -Recommend discontinue use of this cream and switch to ketoconazole as below -Will also send ketoconazole 2% lotion for maintenance as an option -Follow-up as needed         Corinna Gab, DPM [facility] / [facility]

## 2023-05-27 ENCOUNTER — Other Ambulatory Visit: Payer: Self-pay | Admitting: Family Medicine

## 2023-07-30 ENCOUNTER — Other Ambulatory Visit: Payer: Self-pay | Admitting: Family Medicine

## 2023-07-30 MED ORDER — TEMAZEPAM 30 MG PO CAPS: 30.0000 mg | ORAL_CAPSULE | Freq: Every day | ORAL | 1 refills | Status: DC

## 2023-07-30 NOTE — Telephone Encounter (Signed)
 Last Fill: 04/16/23  Last OV: 04/16/23 CPE Next OV: None Scheduled  Routing to provider for review/authorization.

## 2023-07-30 NOTE — Telephone Encounter (Signed)
 Copied from CRM [id number]. Topic: Clinical - Medication Refill >> Jul 30, 2023  9:29 AM Emylou G wrote: Medication: temazepam  (RESTORIL ) 30 MG capsule  Has the patient contacted their pharmacy? Yes (Agent: If no, request that the patient contact the pharmacy for the refill. If patient does not wish to contact the pharmacy document the reason why and proceed with request.) (Agent: If yes, when and what did the pharmacy advise?) can't reach  This is the patient's preferred pharmacy:  [id number] - [place], [place] - [address] AT [facility] [address]  [place] [address] Phone: [phone] Fax: [phone]    Is this the correct pharmacy for this prescription? Yes If no, delete pharmacy and type the correct one.   Has the prescription been filled recently? No  Is the patient out of the medication? Yes  Has the patient been seen for an appointment in the last year OR does the patient have an upcoming appointment? Yes  Can we respond through MyChart? Yes  Agent: Please be advised that Rx refills may take up to 3 business days. We ask that you follow-up with your pharmacy.

## 2023-09-08 ENCOUNTER — Ambulatory Visit: Admitting: Podiatry

## 2023-09-08 ENCOUNTER — Encounter: Payer: Self-pay | Admitting: Podiatry

## 2023-09-08 DIAGNOSIS — L603 Nail dystrophy: Secondary | ICD-10-CM | POA: Diagnosis not present

## 2023-09-08 NOTE — Progress Notes (Signed)
  Subjective:  Patient ID: Brian Bond, male    DOB: 07/13/1940,   MRN: [id number]  Chief Complaint  Patient presents with   Nail Problem    RM#24 Right foot big toe nail pain due to crack in the nail.    83 y.o. male presents for concern of right great toenail. Relates the nail has been very sore and painful. He has had a crack in it a tried to trim but still painful. He relates having the left great toe removed in past but was very sore for a while  . Denies any other pedal complaints. Denies n/v/f/c.   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 CFT <3 seconds. Normal hair growth on digits. No edema.  Skin. No lacerations or abrasions bilateral feet. Right hallux nail dystrophic and tender to palpation.  Musculoskeletal: MMT 5/5 bilateral lower extremities in DF, PF, Inversion and Eversion. Deceased ROM in DF of ankle joint.  Neurological: Sensation intact to light touch.   Assessment:   1. Onychodystrophy      Plan:  Patient was evaluated and treated and all questions answered. Discussed dystrophic toenails etiology and treatment options including procedure for removal vs conservative care.  Patient hesitant on removal of nail today. He is worried about being sore.  Will try debridement which nail was debrided to patient comfort. Discussed soaks and neopsorin and bandaid. Return if no relief and would like to have  removal Return as needed.     Asberry Failing, DPM

## 2023-11-03 ENCOUNTER — Ambulatory Visit: Payer: Self-pay

## 2023-11-03 NOTE — Telephone Encounter (Signed)
 FYI Only or Action Required?: FYI only for provider.  Patient was last seen in primary care on 04/16/2023 by Johnny Garnette LABOR, MD.  Called Nurse Triage reporting Hypertension.  Symptoms began today.  Interventions attempted: Nothing.  Symptoms are: asymptomatic.  Triage Disposition: See PCP Within 2 Weeks  Patient/caregiver understands and will follow disposition?: Yes  Copied from CRM [id number]. Topic: Clinical - Red Word Triage >> Nov 03, 2023  3:34 PM Pinkey ORN wrote: Red Word that prompted transfer to Nurse Triage: Elevated Blood Pressure Reason for Disposition  [1] Systolic BP >= 130 OR Diastolic >= 80 AND [2] not taking BP medications  Answer Assessment - Initial Assessment Questions 1. BLOOD PRESSURE: What is your blood pressure? Did you take at least two measurements 5 minutes apart?     I know there was a 40 in it 2. ONSET: When did you take your blood pressure?     Patient was at an appointment to have his ears checked out and that his BP was too high to continue his exam 3. HOW: How did you take your blood pressure? (e.g., automatic home BP monitor, visiting nurse)     N/A 4. HISTORY: Do you have a history of high blood pressure?     denies 5. MEDICINES: Are you taking any medicines for blood pressure? Have you missed any doses recently?     None for BP 6. OTHER SYMPTOMS: Do you have any symptoms? (e.g., blurred vision, chest pain, difficulty breathing, headache, weakness)     Patient States that he feels a little dizzy every now and then Also states that he stopped taking his blood clot medicine 7. PREGNANCY: Is there any chance you are pregnant? When was your last menstrual period?     N/A  Protocols used: Blood Pressure - High-A-AH

## 2023-11-09 NOTE — Telephone Encounter (Signed)
 Noted

## 2023-11-10 ENCOUNTER — Encounter: Payer: Self-pay | Admitting: Family Medicine

## 2023-11-10 ENCOUNTER — Ambulatory Visit (INDEPENDENT_AMBULATORY_CARE_PROVIDER_SITE_OTHER): Admitting: Family Medicine

## 2023-11-10 VITALS — BP 126/68 | HR 73 | Temp 97.6°F | Wt 211.0 lb

## 2023-11-10 DIAGNOSIS — R972 Elevated prostate specific antigen [PSA]: Secondary | ICD-10-CM | POA: Insufficient documentation

## 2023-11-10 DIAGNOSIS — R030 Elevated blood-pressure reading, without diagnosis of hypertension: Secondary | ICD-10-CM | POA: Diagnosis not present

## 2023-11-10 DIAGNOSIS — N1831 Chronic kidney disease, stage 3a: Secondary | ICD-10-CM | POA: Diagnosis not present

## 2023-11-10 DIAGNOSIS — R600 Localized edema: Secondary | ICD-10-CM | POA: Insufficient documentation

## 2023-11-10 LAB — BASIC METABOLIC PANEL WITH GFR
BUN: 18 mg/dL (ref 6–23)
CO2: 29 meq/L (ref 19–32)
Calcium: 9.5 mg/dL (ref 8.4–10.5)
Chloride: 105 meq/L (ref 96–112)
Creatinine, Ser: 1.29 mg/dL (ref 0.40–1.50)
GFR: 51.8 mL/min — ABNORMAL LOW (ref >60.00)
Glucose, Bld: 97 mg/dL (ref 70–99)
Potassium: 4.5 meq/L (ref 3.5–5.1)
Sodium: 143 meq/L (ref 135–145)

## 2023-11-10 LAB — PSA: PSA: 8.49 ng/mL — ABNORMAL HIGH (ref 0.10–4.00)

## 2023-11-10 NOTE — Progress Notes (Signed)
   Subjective:    Patient ID: Brian Bond, male    DOB: 05-11-1940, 83 y.o.   MRN: [id number]  HPI Here with his son for several issues. First he wants to have his BP checked. He has never had HTN, but on 10-13-23 when he was seen at [facility] his BP was 150/85. He was asked to follow up with us . He feels well although he has had some swelling in both lower legs. He is thinking about retiring around the end of this year. Also his PSA has been going up the past two years ( this was 6.98 on 04-16-23). At that time I advised him to see his urologist, Dr. Selma, but he has not done so yet.    Review of Systems  Constitutional: Negative.   Respiratory: Negative.    Cardiovascular:  Positive for leg swelling. Negative for chest pain and palpitations.       Objective:   Physical Exam Constitutional:      Appearance: Normal appearance.  Cardiovascular:     Rate and Rhythm: Normal rate and regular rhythm.     Pulses: Normal pulses.     Heart sounds: Normal heart sounds.  Pulmonary:     Effort: Pulmonary effort is normal.     Breath sounds: Normal breath sounds.  Musculoskeletal:     Comments: 2+ edema in the right lower leg and 1+ edema in the left lower leg   Neurological:     Mental Status: He is alert.           Assessment & Plan:  His BP is normal today, so we will simply observe this in the future. He has mild leg edema, but this does not bother him so we agreed not to treat it for now. He has stage 3 CKD, so we will check another BMET to follow his renal function. He has an elevated PSA, and we will check another one today. Again I urged him to see Dr. Selma soon to discuss this.  Garnette Olmsted, MD

## 2023-11-11 ENCOUNTER — Ambulatory Visit: Payer: Self-pay | Admitting: Family Medicine

## 2023-11-15 ENCOUNTER — Telehealth: Payer: Self-pay | Admitting: Family Medicine

## 2023-11-15 NOTE — Telephone Encounter (Signed)
 Copied from CRM [id number]. Topic: General - Other >> Nov 15, 2023  9:33 AM Wess RAMAN wrote: Reason for CRM: Patient would like to check the status of his retirement paperwork.  Callback #: [phone]

## 2023-11-15 NOTE — Telephone Encounter (Signed)
 Attempted to call pt back with no success, will try later

## 2023-11-17 ENCOUNTER — Telehealth: Payer: Self-pay | Admitting: *Deleted

## 2023-11-17 NOTE — Telephone Encounter (Signed)
Pt request sent to PCP for advise 

## 2023-11-17 NOTE — Telephone Encounter (Signed)
 Copied from CRM [id number]. Topic: General - Other >> Nov 17, 2023  8:54 AM Treva T wrote: Reason for CRM: Received call from patient, states he is returning call to office regarding paperwork.  Patient requesting a return call, can be reached at [phone].  Patient is aware of same day call back.  Thank you

## 2023-11-17 NOTE — Telephone Encounter (Signed)
 We never received any paperwork from his employer (like FMLA), but I can write a letter to put him out of work the rest of the year. Ask him what the beginning and ending dates of the time out of work would be

## 2023-11-18 NOTE — Telephone Encounter (Signed)
 Attempted to call pt message mailbox is full and not able to leave any message, will try again later

## 2023-11-19 ENCOUNTER — Telehealth: Payer: Self-pay | Admitting: *Deleted

## 2023-11-19 NOTE — Telephone Encounter (Signed)
 Copied from CRM #[id number]. Topic: General - Other >> Nov 19, 2023  9:26 AM Franky GRADE wrote: Reason for CRM: Patient is returning a call he received from [place]. I asked patient of the information Dr.Fry is requesting to write the letter. beginning date 12/07/2023 end date 04/13/2024.

## 2023-11-22 NOTE — Telephone Encounter (Signed)
 The letter is ready

## 2023-11-23 NOTE — Telephone Encounter (Signed)
 Patient dropped off document FMLA, to be filled out by provider. Patient requested to send it back via Call Patient to pick up within 7-days. Document is located in providers tray at front office.Please advise at [facility]

## 2023-11-23 NOTE — Telephone Encounter (Signed)
 Attempted to call pt to pick up his letter from the office, mailbox is full and no option to leave a message will try later

## 2023-11-24 NOTE — Telephone Encounter (Signed)
Pt FMLA form received and placed on Dr Sarajane Jews red folder

## 2023-11-26 DIAGNOSIS — Z0279 Encounter for issue of other medical certificate: Secondary | ICD-10-CM

## 2023-11-26 NOTE — Telephone Encounter (Signed)
 Duplicate encounter

## 2023-11-26 NOTE — Telephone Encounter (Signed)
 The office has attempted several times to let pt know that the form is missing the page that Dr Johnny needs to sign with no success, pt voicemail is full and no success speaking with pt, will keep trying

## 2023-11-26 NOTE — Telephone Encounter (Signed)
 Pt came by the office this afternoon, dropped off some FMLA forms, placed on Dr Johnny red folder.

## 2023-11-26 NOTE — Telephone Encounter (Signed)
 Copied from CRM #[id number]. Topic: General - Other >> Nov 26, 2023 10:32 AM Drema MATSU wrote: Patient is calling to check on FMLA paperwork. He states that today is the 5th working day. Please call patient.

## 2023-11-29 NOTE — Telephone Encounter (Signed)
 Called pt no voicemail full no option to leave a message for pt, will try again later

## 2023-12-02 NOTE — Telephone Encounter (Signed)
 Spoke with pt this morning advised to pick up FMLA form from the office, Pt form placed in the front office filing cabinet and a copy sent to scanning

## 2023-12-24 ENCOUNTER — Telehealth: Payer: Self-pay

## 2023-12-24 NOTE — Telephone Encounter (Signed)
 Copied from CRM [id number]. Topic: Clinical - Medical Advice >> Dec 24, 2023 11:50 AM Harlene ORN wrote: Reason for CRM: FMLA papers filled out from PCP a few weeks ago. Got a letter from the [employer] department informing the patient that he don't have enough information to confirm his claims.Please call the patient back to discuss.

## 2023-12-27 NOTE — Telephone Encounter (Signed)
 Left a message for pt advised to call the office regarding FMLA form

## 2023-12-29 NOTE — Telephone Encounter (Signed)
 Brian Bond, Brian Bond   12/29/2023  4:14 PM  Patient is calling because his employer sent him a letter stating that the information was not accurate enough. It looks a though they are needing more treatment and medication information in order to proceed with the FMLA request. I told the patient to drop off the letter they had sent him and a copy of his FMLA paperwork so the provider could look over their requirements and resend the fax.

## 2023-12-31 ENCOUNTER — Telehealth: Payer: Self-pay | Admitting: Family Medicine

## 2023-12-31 NOTE — Telephone Encounter (Signed)
 Pt dropped of the FMLA form at the office this afternoon, per Dr Johnny pt needs an OV to go over the form. Will contact pt for appointment

## 2023-12-31 NOTE — Telephone Encounter (Signed)
 [employer] FMLA forms to be filled out--previous forms that were brought in need to be corrected.  Patient has extention but needs to be returned ASAP.  Placed in provider's folder.  Call pt at [phone] upon completion

## 2023-12-31 NOTE — Telephone Encounter (Signed)
 Left pt a detailed message regarding the FMLA form advised per Dr Johnny to schedule appointment with Dr Johnny for this

## 2023-12-31 NOTE — Telephone Encounter (Signed)
 Pt  form has been received, as per Dr Johnny pt needs OV appointment to review the FMLA, Attempted to call pt to schedule appointment with no success. Will try again later

## 2024-01-04 ENCOUNTER — Ambulatory Visit: Admitting: Family Medicine

## 2024-01-04 ENCOUNTER — Encounter: Payer: Self-pay | Admitting: Family Medicine

## 2024-01-04 VITALS — BP 110/70 | HR 80 | Temp 98.2°F | Wt 223.0 lb

## 2024-01-04 DIAGNOSIS — M545 Low back pain, unspecified: Secondary | ICD-10-CM

## 2024-01-04 DIAGNOSIS — G8929 Other chronic pain: Secondary | ICD-10-CM | POA: Diagnosis not present

## 2024-01-04 MED ORDER — MELOXICAM 15 MG PO TABS: 15.0000 mg | ORAL_TABLET | Freq: Every day | ORAL | 3 refills | Status: AC

## 2024-01-04 NOTE — Progress Notes (Signed)
   Subjective:    Patient ID: Brian Bond, male    DOB: [birth year], 83 y.o.   MRN: [id number]  HPI Here to follow up on his chronic low back pain. He has applied for FMLA to be out of work from 12-07-23 through 04-14-24. He does daily stretches at home, and he takes Meloxicam 15 mg daily. His pain is about the same as usual. It is constant and it prevents him from performing his normal job duties.    Review of Systems  Constitutional: Negative.   Respiratory: Negative.    Cardiovascular: Negative.   Musculoskeletal:  Positive for back pain.       Objective:   Physical Exam Constitutional:      Appearance: Normal appearance.  Cardiovascular:     Rate and Rhythm: Normal rate and regular rhythm.     Pulses: Normal pulses.     Heart sounds: Normal heart sounds.  Pulmonary:     Effort: Pulmonary effort is normal.     Breath sounds: Normal breath sounds.  Musculoskeletal:     Comments: He is tender in the lower back. ROM of the lower spine is limited by pain   Neurological:     Mental Status: He is alert.           Assessment & Plan:  Chronic low back pain. He will continue his stretches and he will take the Meloxicam daily. We plan to recheck him in 3 months.  Garnette Olmsted, MD

## 2024-01-05 NOTE — Telephone Encounter (Signed)
 Pt had appointment with Dr Johnny on 01/04/24 for FMLA review, FMLA form faxed successfully to number provided

## 2024-01-31 ENCOUNTER — Other Ambulatory Visit: Payer: Self-pay | Admitting: Family Medicine

## 2024-02-07 ENCOUNTER — Ambulatory Visit: Payer: Self-pay

## 2024-02-07 NOTE — Telephone Encounter (Signed)
 FYI Only or Action Required?: FYI only for provider: appointment scheduled on 02/08/24.  Patient was last seen in primary care on 01/04/2024 by Johnny Garnette LABOR, MD.  Called Nurse Triage reporting Vomiting.  Symptoms began several days ago.  Interventions attempted: Rest, hydration, or home remedies.  Symptoms are: unchanged.  Triage Disposition: See Physician Within 24 Hours  Patient/caregiver understands and will follow disposition?: Yes   Copied from CRM #[id number]. Topic: Clinical - Red Word Triage >> Feb 07, 2024 10:20 AM Harlene ORN wrote: Red Word that prompted transfer to Nurse Triage: since Thanksgiving, vomiting and nausea, feel hot to the touch and chills Reason for Disposition  [1] MILD or MODERATE vomiting AND [2] present > 48 hours (2 days)  (Exception: Mild vomiting with associated diarrhea.)  Answer Assessment - Initial Assessment Questions 1. VOMITING SEVERITY: How many times have you vomited in the past 24 hours?      Only at night when he lays down 2. ONSET: When did the vomiting begin?      02/03/24 3. FLUIDS: What fluids or food have you vomited up today? Have you been able to keep any fluids down?     Able to hold down fluids  4. ABDOMEN PAIN: Are your having any abdomen pain? If Yes : How bad is it and what does it feel like? (e.g., crampy, dull, intermittent, constant)      denies 5. DIARRHEA: Is there any diarrhea? If Yes, ask: How many times today?      denies 6. CONTACTS: Is there anyone else in the family with the same symptoms?      Denies  7. CAUSE: What do you think is causing your vomiting?     unsure 8. HYDRATION STATUS: Any signs of dehydration? (e.g., dry mouth [not only dry lips], too weak to stand) When did you last urinate?     hydrated 9. OTHER SYMPTOMS: Do you have any other symptoms? (e.g., fever, headache, vertigo, vomiting blood or coffee grounds, recent head injury)     Chills 10. PREGNANCY: Is there any chance  you are pregnant? When was your last menstrual period?  Protocols used: Vomiting-A-AH

## 2024-02-07 NOTE — Telephone Encounter (Signed)
 Noted

## 2024-02-08 ENCOUNTER — Encounter: Payer: Self-pay | Admitting: Family Medicine

## 2024-02-08 ENCOUNTER — Ambulatory Visit: Admitting: Family Medicine

## 2024-02-08 VITALS — BP 110/68 | HR 89 | Temp 99.1°F | Wt 222.0 lb

## 2024-02-08 DIAGNOSIS — R059 Cough, unspecified: Secondary | ICD-10-CM

## 2024-02-08 DIAGNOSIS — K219 Gastro-esophageal reflux disease without esophagitis: Secondary | ICD-10-CM | POA: Diagnosis not present

## 2024-02-08 LAB — POCT INFLUENZA A/B
Influenza A, POC: NEGATIVE
Influenza B, POC: NEGATIVE

## 2024-02-08 LAB — POC COVID19 BINAXNOW: SARS Coronavirus 2 Ag: NEGATIVE

## 2024-02-08 MED ORDER — OMEPRAZOLE 40 MG PO CPDR: 40.0000 mg | DELAYED_RELEASE_CAPSULE | Freq: Every day | ORAL | 5 refills | Status: AC

## 2024-02-08 NOTE — Addendum Note (Signed)
 Addended by: LADONNA INOCENTE SAILOR on: 02/08/2024 10:00 AM   Modules accepted: Orders

## 2024-02-08 NOTE — Progress Notes (Signed)
   Subjective:    Patient ID: Brian Bond, male    DOB: 07/16/1940, 83 y.o.   MRN: [id number]  HPI Here for 2 months of intermittent burning in his upper abdomen when he eats spicy food. He also has belching and hiccups that come and go. His BM's are regular. No trouble swallowing.    Review of Systems  Constitutional: Negative.   HENT: Negative.    Eyes: Negative.   Respiratory: Negative.    Cardiovascular: Negative.   Gastrointestinal:  Positive for nausea. Negative for abdominal distention, abdominal pain, constipation, diarrhea and vomiting.       Objective:   Physical Exam Constitutional:      Appearance: Normal appearance.  Cardiovascular:     Rate and Rhythm: Normal rate and regular rhythm.     Pulses: Normal pulses.  Pulmonary:     Effort: Pulmonary effort is normal.     Breath sounds: Normal breath sounds.  Abdominal:     General: Abdomen is flat. Bowel sounds are normal. There is no distension.     Palpations: Abdomen is soft. There is no mass.     Tenderness: There is no abdominal tenderness. There is no guarding or rebound.     Hernia: No hernia is present.  Neurological:     Mental Status: He is alert.           Assessment & Plan:  GERD, he will try Omeprazole 40 mg every morning.  Garnette Olmsted, MD

## 2024-03-06 ENCOUNTER — Ambulatory Visit: Payer: Self-pay

## 2024-03-06 ENCOUNTER — Telehealth: Payer: Self-pay

## 2024-03-06 NOTE — Transitions of Care (Post Inpatient/ED Visit) (Unsigned)
" ° °  03/06/2024  Name: Brian Bond MRN: [id number] DOB: [birth year]  Today's TOC FU Call Status: Today's TOC FU Call Status:: Unsuccessful Call (1st Attempt) Unsuccessful Call (1st Attempt) Date: 03/06/24  Attempted to reach the patient regarding the most recent Inpatient/ED visit.  Follow Up Plan: Additional outreach attempts will be made to reach the patient to complete the Transitions of Care (Post Inpatient/ED visit) call.   Signature Julian Lemmings, LPN [facility] Nurse Health Advisor Direct Dial [phone]  "

## 2024-03-06 NOTE — Telephone Encounter (Signed)
 FYI Only or Action Required?: FYI only for provider: No appts in next 24 hours, advised UC, agreeable to go. Hospital f/u also scheduled for 1/6.  Patient was last seen in primary care on 02/08/2024 by Johnny Garnette LABOR, MD.  Called Nurse Triage reporting Shoulder Pain, Shoulder Injury, and Fall.  Symptoms began several days ago.  Interventions attempted: Nothing.  Symptoms are: gradually worsening.  Triage Disposition: See Physician Within 24 Hours  Patient/caregiver understands and will follow disposition?: Yes     Spoke with pts son Rasheed Welty. (on [place]). Saturday morning had a fall. Lost consciousness for about 10 minutes. Does not take blood thinner.  Reports going to ED [facility] in [place], [place] . Got some imaging of head, no other imaging. Had blood on the brain. Plan was to watch and wait. Discharged yesterday. Back in [place] now.   No confusion, just more lethargic. No changes to speech or vision. Has some staples in his head. Reporting persistent moderate headache and well as persistent moderate left shoulder pain after the fall. No numbness weakness or tingling. No appts in next 24 hours, advised UC, agreeable to go. UC appt scheduled for tomorrow. Hospital f/u also scheduled for 1/6. Advised ED for worsening symptoms. Number provided to medical records to upload hospital visit files from Saturday in [place], [place].    Copied from CRM [id number]. Topic: Clinical - Red Word Triage >> Mar 06, 2024  4:49 PM Jayma L wrote: Red Word that prompted transfer to Nurse Triage: patients son calling patient is having worse pain in left shoulder after falling Reason for Disposition  [1] After 3 days AND [2] headache persists  [1] MODERATE pain (e.g., interferes with normal activities) AND [2] high-risk adult (e.g., age > 60 years, osteoporosis, chronic steroid use)  Answer Assessment - Initial Assessment Questions 1. MECHANISM: How did the injury happen? For falls, ask:  What height did you fall from? and What surface did you fall against?      Dizzy after drinking too much and fell  2. ONSET: When did the injury happen? (e.g., minutes, hours ago)      Saturday  3. NEUROLOGIC SYMPTOMS: Was there any loss of consciousness? Are there any other neurological symptoms?      Lost consciousness for about 10 minutes.  4. MENTAL STATUS: Does the person know who they are, who you are, and where they are?      Oriented  5. LOCATION: What part of the head was hit?      Left   6. SCALP APPEARANCE: What does the scalp look like? Is it bleeding now? If Yes, ask: Is it difficult to stop?      1 inch laceration with staples in it.   7. SIZE: For cuts, bruises, or swelling, ask: How large is it? (e.g., inches or centimeters)      1 inch laceration with staples in it.   8. PAIN: Is there any pain? If Yes, ask: How bad is it? (Scale 0-10; or none, mild, moderate, severe)     Yes.   9. TETANUS: For any breaks in the skin, ask: When was your last tetanus booster?     Denies  10. BLOOD THINNERS: Do you take any blood thinners? (e.g., aspirin , clopidogrel / Plavix, coumadin, heparin ). Notes: Other strong blood thinners include: Arixtra (fondaparinux), Eliquis  (apixaban ), Pradaxa (dabigatran), and Xarelto (rivaroxaban).       Denies  11. OTHER SYMPTOMS: Do you have any other symptoms? (e.g., neck pain, vomiting)  Moderate left shoulder pain, moderate headache pain  Answer Assessment - Initial Assessment Questions 1. MECHANISM: How did the injury happen?     Fell on Saturday after drinking too much  2. ONSET: When did the injury happen? (e.g., minutes, hours ago)      Saturday  3. APPEARANCE of INJURY: What does the injury look like?      Unsure  4. SEVERITY: Can you move the shoulder normally?      Can use but is limited  5. SIZE: For cuts, bruises, or swelling, ask: How large is it? (e.g., inches or centimeters;   entire joint)      Looks normal  6. PAIN: Is there pain? If Yes, ask: How bad is the pain?  (Scale 0-10; or none, mild, moderate, severe)     Moderate  7. TETANUS: For any breaks in the skin, ask: When was your last tetanus booster?     Has not gotten  8. OTHER SYMPTOMS: Do you have any other symptoms? (e.g., loss of sensation)     Denies  Protocols used: Shoulder Injury-A-AH, Head Injury-A-AH

## 2024-03-07 ENCOUNTER — Ambulatory Visit: Payer: Self-pay

## 2024-03-07 NOTE — Telephone Encounter (Signed)
 Noted

## 2024-03-07 NOTE — Transitions of Care (Post Inpatient/ED Visit) (Signed)
" ° °  03/07/2024  Name: Brian Bond MRN: [id number] DOB: 02/26/1941  Today's TOC FU Call Status: Today's TOC FU Call Status:: Successful TOC FU Call Completed Unsuccessful Call (1st Attempt) Date: 03/06/24 [facility] FU Call Complete Date: 03/07/24  Patient's Name and Date of Birth confirmed. Name, DOB  Transition Care Management Follow-up Telephone Call Date of Discharge: 03/05/24 Discharge Facility: Other [facility]) Name of Other (Non[facility]) Discharge Facility: [facility] Type of Discharge: Inpatient Admission Primary Inpatient Discharge Diagnosis:: hemorrhage How have you been since you were released from the hospital?: Better Any questions or concerns?: No  Items Reviewed: Did you receive and understand the discharge instructions provided?: Yes Medications obtained,verified, and reconciled?: Yes (Medications Reviewed) Any new allergies since your discharge?: No Dietary orders reviewed?: Yes Do you have support at home?: Yes People in Home [RPT]: child(ren), adult  Medications Reviewed Today: Medications Reviewed Today     Reviewed by Emmitt Pan, LPN (Licensed Practical Nurse) on 03/07/24 at 1545  Med List Status: <None>   Medication Order Taking? Sig Documenting Provider Last Dose Status Informant  aspirin  EC 81 MG tablet [id number] Yes Take 1 tablet (81 mg total) by mouth daily. Swallow whole. Johnny Garnette LABOR, MD  Active   clotrimazole -betamethasone  (LOTRISONE ) cream [id number] Yes Apply 1 Application topically daily. Standiford, Marsa FALCON, DPM  Active   ketoconazole  (NIZORAL ) 2 % cream [id number] Yes Apply 1 Application topically daily. Standiford, Marsa FALCON, DPM  Active   meloxicam  (MOBIC ) 15 MG tablet [id number] Yes Take 1 tablet (15 mg total) by mouth daily. Johnny Garnette LABOR, MD  Active   omeprazole  [facility]) 40 MG capsule [id number] Yes Take 1 capsule (40 mg total) by mouth daily. Johnny Garnette LABOR, MD  Active   temazepam  (RESTORIL ) 30 MG capsule [id number]  Yes TAKE 1 CAPSULE(30 MG) BY MOUTH DAILY Johnny Garnette LABOR, MD  Active             Home Care and Equipment/Supplies: Were Home Health Services Ordered?: NA Any new equipment or medical supplies ordered?: NA  Functional Questionnaire: Do you need assistance with bathing/showering or dressing?: No Do you need assistance with meal preparation?: No Do you need assistance with eating?: No Do you have difficulty maintaining continence: No Do you need assistance with getting out of bed/getting out of a chair/moving?: No Do you have difficulty managing or taking your medications?: No  Follow up appointments reviewed: PCP Follow-up appointment confirmed?: Yes Date of PCP follow-up appointment?: 03/14/24 Follow-up Provider: [facility] Follow-up appointment confirmed?: NA Do you need transportation to your follow-up appointment?: No Do you understand care options if your condition(s) worsen?: Yes-patient verbalized understanding    SIGNATURE Pan Emmitt, LPN [facility] Nurse Health Advisor Direct Dial [phone]  "

## 2024-03-14 ENCOUNTER — Ambulatory Visit

## 2024-03-14 ENCOUNTER — Encounter: Payer: Self-pay | Admitting: Family Medicine

## 2024-03-14 ENCOUNTER — Ambulatory Visit (INDEPENDENT_AMBULATORY_CARE_PROVIDER_SITE_OTHER): Admitting: Family Medicine

## 2024-03-14 VITALS — BP 102/60 | HR 74 | Temp 98.6°F | Wt 220.0 lb

## 2024-03-14 DIAGNOSIS — S0083XD Contusion of other part of head, subsequent encounter: Secondary | ICD-10-CM

## 2024-03-14 DIAGNOSIS — R55 Syncope and collapse: Secondary | ICD-10-CM

## 2024-03-14 DIAGNOSIS — M25512 Pain in left shoulder: Secondary | ICD-10-CM

## 2024-03-14 DIAGNOSIS — S065XAA Traumatic subdural hemorrhage with loss of consciousness status unknown, initial encounter: Secondary | ICD-10-CM

## 2024-03-14 DIAGNOSIS — S0101XD Laceration without foreign body of scalp, subsequent encounter: Secondary | ICD-10-CM | POA: Diagnosis not present

## 2024-03-14 DIAGNOSIS — S0101XS Laceration without foreign body of scalp, sequela: Secondary | ICD-10-CM

## 2024-03-14 NOTE — Progress Notes (Signed)
 "  Subjective:    Patient ID: Brian Bond, male    DOB: March 16, 1940, 84 y.o.   MRN: [id number]  HPI Here with his son to follow up on an overnight hospital stay in [facility] in [place] from 03-05-24 to 03-06-24. On the morning of admission Brian Bond remembers getting up after a night of poor sleep and going into the kitchen to get some breakfast. As he was walking to the table, he apparently passed out and fell. His son heard him in the next room and found him on the floor unresponsive for a few seconds, but he quickly regained consciousness. He was bleeding from a laceration on his head but otherwise he says he felt fine after that. No other apparent injuries that day. EMS transported him to the ED where a CT of his head showed a small amount of bleeding on the left side of his brain (according to his son). I cannot access any of these medical records. They told Brian Bond that this should not be a problem and that he did not require surgery to fix it. Otherwise labs and EKG were all normal. The scalp laceration was closed with staples. The next day he felt dine except for a mild headache so he was sent home. He was given no new medications but was told to use Tylenol  if needed. Since then Brian Bond has had a mild headache on the left side of his head, but he denies any neurologic deficits, no nausea, etc. No SOB or chest pain or palpitations. The only new concern he has is a mild pain in the left shoulder he noticed the day after he got back home.    Review of Systems  Constitutional: Negative.   Respiratory: Negative.    Cardiovascular: Negative.   Gastrointestinal: Negative.   Genitourinary: Negative.   Musculoskeletal:  Positive for arthralgias.  Neurological:  Positive for headaches. Negative for dizziness, tremors, seizures, syncope, facial asymmetry, speech difficulty, weakness, light-headedness and numbness.       Objective:   Physical Exam Constitutional:       Appearance: Normal appearance.  HENT:     Head:     Comments: There is a closed laceration on the left temple closed with 3 staples  Eyes:     Pupils: Pupils are equal, round, and reactive to light.  Neck:     Vascular: No carotid bruit.  Cardiovascular:     Rate and Rhythm: Normal rate and regular rhythm.     Pulses: Normal pulses.     Heart sounds: Normal heart sounds.  Pulmonary:     Effort: Pulmonary effort is normal.     Breath sounds: Normal breath sounds.  Musculoskeletal:     Cervical back: Normal range of motion and neck supple. No tenderness.     Comments: He is tender over the left AC joint but there is no separation. The shoulder has full ROM   Neurological:     General: No focal deficit present.     Mental Status: He is alert and oriented to person, place, and time.           Assessment & Plan:  He had a recent fall as the result of a brief syncopal episode, and the etiology of the syncope is not clear. The fall resulted in a head contusion and scalp laceration, as well as what sounds like a small subdural hematoma. The staples were removed today. We will order carotid dopplers to assess for  any stenoses. We will set up another non-contrasted head CT to follow the hematoma. He suffered a mild left AC sprain, and we will get Xrays of the left shoulder today. I personally spent a total of 35 minutes in the care of the patient today including getting/reviewing separately obtained history, performing a medically appropriate exam/evaluation, and placing orders.  Garnette Olmsted, MD   "

## 2024-03-16 ENCOUNTER — Ambulatory Visit
Admission: RE | Admit: 2024-03-16 | Discharge: 2024-03-16 | Disposition: A | Discharge status: Home / Self Care | Source: Ambulatory Visit | Attending: Family Medicine | Admitting: Family Medicine

## 2024-03-16 DIAGNOSIS — R55 Syncope and collapse: Secondary | ICD-10-CM

## 2024-03-20 ENCOUNTER — Encounter: Payer: Self-pay | Admitting: Family Medicine

## 2024-03-23 ENCOUNTER — Ambulatory Visit: Payer: Self-pay | Admitting: Family Medicine

## 2024-03-24 ENCOUNTER — Ambulatory Visit: Payer: Self-pay

## 2024-03-24 NOTE — Telephone Encounter (Signed)
 FYI Only or Action Required?: Action required by provider: lab or test result follow-up needed.  Patient was last seen in primary care on 03/14/2024 by Johnny Garnette LABOR, MD.  Called Nurse Triage reporting Results.  Symptoms began n/a.  Interventions attempted: Other: n/a.  Symptoms are: gradually improving.  Triage Disposition: Call PCP Within 24 Hours  Patient/caregiver understands and will follow disposition?: No, wishes to speak with PCP   Reason for Disposition  [1] Follow-up call from patient regarding patient's clinical status AND [2] information NON-URGENT  Answer Assessment - Initial Assessment Questions 1. REASON FOR CALL or QUESTION: What is your reason for calling today? or How can I best    Patient called in to obtain the results of his head CT. PAS attempted to reach CAL for assistance but CMA not available, patient transferred to nurse triage. Chart reviewed-no documentation by pcp on head CT results at this time. Advised patient message will be sent to provider for follow up.  Patient shared he is healing and feeling pretty good, he has not had any episodes of presyncope. Advised to call back for new or worsening symptoms. Please follow up with patient re: head CT results and treatment plan.  Protocols used: PCP Call - No Triage-A-AH  Copied from CRM [id number]. Topic: Clinical - Lab/Test Results >> Mar 24, 2024  3:45 PM Terri MATSU wrote: Reason for CRM: Tabitha (patients son) returning Idell A call for test results. Long hold and advised someone will call . He stated can you call his father first and if he doesn't answer to call him at [phone] >> Mar 24, 2024  4:15 PM China J wrote: Patient is calling for his imaging results. Due to CAL not being able to relay results, I was able to get the patient over to triage for results instead.

## 2024-03-27 NOTE — Addendum Note (Signed)
 Addended by: JOHNNY SENIOR A on: 03/27/2024 05:12 PM   Modules accepted: Orders

## 2024-03-27 NOTE — Telephone Encounter (Signed)
 I ordered another head CT for 4 weeks out

## 2024-03-27 NOTE — Telephone Encounter (Signed)
 Reviewed CT scan results with pt/son voiced understanding of Dr Johnny recommendation

## 2024-04-11 ENCOUNTER — Encounter (HOSPITAL_COMMUNITY): Payer: Self-pay

## 2024-04-24 ENCOUNTER — Other Ambulatory Visit
